# Patient Record
Sex: Female | Born: 1975 | Race: Black or African American | Hispanic: No | Marital: Single | Smoking: Never smoker
Health system: Southern US, Community
[De-identification: ages and names within clinical notes are randomized; demographics above are authoritative.]

## PROBLEM LIST (undated history)

## (undated) DIAGNOSIS — F419 Anxiety disorder, unspecified: Secondary | ICD-10-CM

## (undated) DIAGNOSIS — IMO0002 Reserved for concepts with insufficient information to code with codable children: Secondary | ICD-10-CM

## (undated) DIAGNOSIS — K449 Diaphragmatic hernia without obstruction or gangrene: Secondary | ICD-10-CM

## (undated) DIAGNOSIS — F329 Major depressive disorder, single episode, unspecified: Secondary | ICD-10-CM

## (undated) DIAGNOSIS — M5126 Other intervertebral disc displacement, lumbar region: Secondary | ICD-10-CM

## (undated) DIAGNOSIS — K589 Irritable bowel syndrome without diarrhea: Secondary | ICD-10-CM

## (undated) DIAGNOSIS — G43909 Migraine, unspecified, not intractable, without status migrainosus: Secondary | ICD-10-CM

## (undated) DIAGNOSIS — N879 Dysplasia of cervix uteri, unspecified: Secondary | ICD-10-CM

## (undated) DIAGNOSIS — R0600 Dyspnea, unspecified: Secondary | ICD-10-CM

## (undated) DIAGNOSIS — F4310 Post-traumatic stress disorder, unspecified: Secondary | ICD-10-CM

## (undated) DIAGNOSIS — K219 Gastro-esophageal reflux disease without esophagitis: Secondary | ICD-10-CM

## (undated) DIAGNOSIS — E669 Obesity, unspecified: Secondary | ICD-10-CM

## (undated) DIAGNOSIS — F32A Depression, unspecified: Secondary | ICD-10-CM

## (undated) DIAGNOSIS — M8580 Other specified disorders of bone density and structure, unspecified site: Secondary | ICD-10-CM

## (undated) DIAGNOSIS — M549 Dorsalgia, unspecified: Secondary | ICD-10-CM

## (undated) DIAGNOSIS — J329 Chronic sinusitis, unspecified: Secondary | ICD-10-CM

## (undated) DIAGNOSIS — R351 Nocturia: Secondary | ICD-10-CM

## (undated) DIAGNOSIS — IMO0001 Reserved for inherently not codable concepts without codable children: Secondary | ICD-10-CM

## (undated) HISTORY — PX: COLPOSCOPY: SHX161

## (undated) HISTORY — DX: Anxiety disorder, unspecified: F41.9

## (undated) HISTORY — DX: Other specified disorders of bone density and structure, unspecified site: M85.80

## (undated) HISTORY — DX: Dysplasia of cervix uteri, unspecified: N87.9

## (undated) HISTORY — DX: Depression, unspecified: F32.A

## (undated) HISTORY — DX: Gastro-esophageal reflux disease without esophagitis: K21.9

## (undated) HISTORY — DX: Obesity, unspecified: E66.9

## (undated) HISTORY — DX: Diaphragmatic hernia without obstruction or gangrene: K44.9

## (undated) HISTORY — DX: Major depressive disorder, single episode, unspecified: F32.9

## (undated) HISTORY — DX: Reserved for concepts with insufficient information to code with codable children: IMO0002

## (undated) HISTORY — DX: Irritable bowel syndrome, unspecified: K58.9

## (undated) HISTORY — DX: Migraine, unspecified, not intractable, without status migrainosus: G43.909

## (undated) HISTORY — DX: Chronic sinusitis, unspecified: J32.9

## (undated) HISTORY — PX: CERVICAL BIOPSY  W/ LOOP ELECTRODE EXCISION: SUR135

---

## 1998-06-24 ENCOUNTER — Other Ambulatory Visit: Admission: RE | Admit: 1998-06-24 | Discharge: 1998-06-24 | Payer: Self-pay | Admitting: Gynecology

## 1998-08-05 ENCOUNTER — Other Ambulatory Visit: Admission: RE | Admit: 1998-08-05 | Discharge: 1998-08-05 | Payer: Self-pay | Admitting: Gynecology

## 1998-10-09 HISTORY — PX: LEEP: SHX91

## 1998-12-14 ENCOUNTER — Other Ambulatory Visit: Admission: RE | Admit: 1998-12-14 | Discharge: 1998-12-14 | Payer: Self-pay | Admitting: Gynecology

## 1999-03-28 ENCOUNTER — Other Ambulatory Visit: Admission: RE | Admit: 1999-03-28 | Discharge: 1999-03-28 | Payer: Self-pay | Admitting: Obstetrics and Gynecology

## 1999-05-05 ENCOUNTER — Encounter (INDEPENDENT_AMBULATORY_CARE_PROVIDER_SITE_OTHER): Payer: Self-pay

## 1999-05-05 ENCOUNTER — Other Ambulatory Visit: Admission: RE | Admit: 1999-05-05 | Discharge: 1999-05-05 | Payer: Self-pay | Admitting: *Deleted

## 1999-05-25 ENCOUNTER — Ambulatory Visit (HOSPITAL_COMMUNITY): Admission: RE | Admit: 1999-05-25 | Discharge: 1999-05-25 | Payer: Self-pay | Admitting: *Deleted

## 1999-05-25 ENCOUNTER — Encounter (INDEPENDENT_AMBULATORY_CARE_PROVIDER_SITE_OTHER): Payer: Self-pay

## 2000-01-30 ENCOUNTER — Encounter (INDEPENDENT_AMBULATORY_CARE_PROVIDER_SITE_OTHER): Payer: Self-pay | Admitting: Specialist

## 2000-01-30 ENCOUNTER — Other Ambulatory Visit: Admission: RE | Admit: 2000-01-30 | Discharge: 2000-01-30 | Payer: Self-pay | Admitting: *Deleted

## 2000-08-28 ENCOUNTER — Other Ambulatory Visit: Admission: RE | Admit: 2000-08-28 | Discharge: 2000-08-28 | Payer: Self-pay | Admitting: Obstetrics and Gynecology

## 2001-02-04 ENCOUNTER — Other Ambulatory Visit: Admission: RE | Admit: 2001-02-04 | Discharge: 2001-02-04 | Payer: Self-pay | Admitting: *Deleted

## 2001-02-04 ENCOUNTER — Encounter (INDEPENDENT_AMBULATORY_CARE_PROVIDER_SITE_OTHER): Payer: Self-pay

## 2001-10-09 HISTORY — PX: WISDOM TOOTH EXTRACTION: SHX21

## 2001-10-25 ENCOUNTER — Other Ambulatory Visit: Admission: RE | Admit: 2001-10-25 | Discharge: 2001-10-25 | Payer: Self-pay | Admitting: *Deleted

## 2001-12-18 ENCOUNTER — Emergency Department (HOSPITAL_COMMUNITY): Admission: EM | Admit: 2001-12-18 | Discharge: 2001-12-18 | Payer: Self-pay | Admitting: Emergency Medicine

## 2002-01-20 ENCOUNTER — Other Ambulatory Visit: Admission: RE | Admit: 2002-01-20 | Discharge: 2002-01-20 | Payer: Self-pay | Admitting: *Deleted

## 2002-04-16 ENCOUNTER — Ambulatory Visit (HOSPITAL_COMMUNITY): Admission: RE | Admit: 2002-04-16 | Discharge: 2002-04-16 | Payer: Self-pay | Admitting: Internal Medicine

## 2002-07-10 ENCOUNTER — Other Ambulatory Visit: Admission: RE | Admit: 2002-07-10 | Discharge: 2002-07-10 | Payer: Self-pay | Admitting: *Deleted

## 2002-10-09 HISTORY — PX: CHOLECYSTECTOMY: SHX55

## 2002-10-17 ENCOUNTER — Other Ambulatory Visit: Admission: RE | Admit: 2002-10-17 | Discharge: 2002-10-17 | Payer: Self-pay | Admitting: *Deleted

## 2002-10-21 ENCOUNTER — Observation Stay (HOSPITAL_COMMUNITY): Admission: RE | Admit: 2002-10-21 | Discharge: 2002-10-22 | Payer: Self-pay | Admitting: General Surgery

## 2003-04-20 ENCOUNTER — Other Ambulatory Visit: Admission: RE | Admit: 2003-04-20 | Discharge: 2003-04-20 | Payer: Self-pay | Admitting: *Deleted

## 2003-10-10 HISTORY — PX: SEPTOPLASTY: SHX2393

## 2003-10-10 HISTORY — PX: NASAL TURBINATE REDUCTION: SHX2072

## 2003-10-10 HISTORY — PX: TONSILLECTOMY: SHX5217

## 2003-11-03 ENCOUNTER — Other Ambulatory Visit: Admission: RE | Admit: 2003-11-03 | Discharge: 2003-11-03 | Payer: Self-pay | Admitting: Obstetrics and Gynecology

## 2003-12-14 ENCOUNTER — Emergency Department (HOSPITAL_COMMUNITY): Admission: AD | Admit: 2003-12-14 | Discharge: 2003-12-14 | Payer: Self-pay | Admitting: Family Medicine

## 2003-12-29 ENCOUNTER — Ambulatory Visit (HOSPITAL_BASED_OUTPATIENT_CLINIC_OR_DEPARTMENT_OTHER): Admission: RE | Admit: 2003-12-29 | Discharge: 2003-12-29 | Payer: Self-pay | Admitting: Otolaryngology

## 2004-03-07 ENCOUNTER — Emergency Department (HOSPITAL_COMMUNITY): Admission: EM | Admit: 2004-03-07 | Discharge: 2004-03-07 | Payer: Self-pay | Admitting: Family Medicine

## 2004-04-26 ENCOUNTER — Other Ambulatory Visit: Admission: RE | Admit: 2004-04-26 | Discharge: 2004-04-26 | Payer: Self-pay | Admitting: Obstetrics and Gynecology

## 2004-05-16 ENCOUNTER — Encounter (INDEPENDENT_AMBULATORY_CARE_PROVIDER_SITE_OTHER): Payer: Self-pay | Admitting: *Deleted

## 2004-05-16 ENCOUNTER — Ambulatory Visit (HOSPITAL_COMMUNITY): Admission: RE | Admit: 2004-05-16 | Discharge: 2004-05-16 | Payer: Self-pay | Admitting: Otolaryngology

## 2004-05-16 ENCOUNTER — Ambulatory Visit (HOSPITAL_BASED_OUTPATIENT_CLINIC_OR_DEPARTMENT_OTHER): Admission: RE | Admit: 2004-05-16 | Discharge: 2004-05-16 | Payer: Self-pay | Admitting: Otolaryngology

## 2004-09-15 ENCOUNTER — Other Ambulatory Visit: Admission: RE | Admit: 2004-09-15 | Discharge: 2004-09-15 | Payer: Self-pay | Admitting: Obstetrics and Gynecology

## 2004-11-09 ENCOUNTER — Ambulatory Visit (HOSPITAL_COMMUNITY): Admission: RE | Admit: 2004-11-09 | Discharge: 2004-11-09 | Payer: Self-pay | Admitting: Family Medicine

## 2004-12-15 ENCOUNTER — Encounter: Admission: RE | Admit: 2004-12-15 | Discharge: 2004-12-15 | Payer: Self-pay | Admitting: Neurosurgery

## 2004-12-29 ENCOUNTER — Other Ambulatory Visit: Admission: RE | Admit: 2004-12-29 | Discharge: 2004-12-29 | Payer: Self-pay | Admitting: Obstetrics and Gynecology

## 2005-01-05 ENCOUNTER — Encounter: Admission: RE | Admit: 2005-01-05 | Discharge: 2005-01-05 | Payer: Self-pay | Admitting: Neurosurgery

## 2005-04-03 ENCOUNTER — Ambulatory Visit: Payer: Self-pay | Admitting: Gastroenterology

## 2005-06-13 ENCOUNTER — Encounter: Admission: RE | Admit: 2005-06-13 | Discharge: 2005-06-13 | Payer: Self-pay | Admitting: Neurosurgery

## 2005-09-13 ENCOUNTER — Other Ambulatory Visit: Admission: RE | Admit: 2005-09-13 | Discharge: 2005-09-13 | Payer: Self-pay | Admitting: Obstetrics and Gynecology

## 2005-11-08 ENCOUNTER — Ambulatory Visit: Payer: Self-pay | Admitting: Gastroenterology

## 2005-12-27 ENCOUNTER — Ambulatory Visit: Payer: Self-pay | Admitting: Gastroenterology

## 2006-01-01 ENCOUNTER — Other Ambulatory Visit: Admission: RE | Admit: 2006-01-01 | Discharge: 2006-01-01 | Payer: Self-pay | Admitting: Obstetrics and Gynecology

## 2006-04-08 HISTORY — PX: ESOPHAGOGASTRODUODENOSCOPY: SHX1529

## 2006-04-08 HISTORY — PX: SIGMOIDOSCOPY: SUR1295

## 2006-04-23 ENCOUNTER — Ambulatory Visit: Payer: Self-pay | Admitting: Gastroenterology

## 2006-05-01 ENCOUNTER — Encounter (INDEPENDENT_AMBULATORY_CARE_PROVIDER_SITE_OTHER): Payer: Self-pay | Admitting: Gastroenterology

## 2006-05-01 ENCOUNTER — Ambulatory Visit: Payer: Self-pay | Admitting: Gastroenterology

## 2006-06-29 ENCOUNTER — Other Ambulatory Visit: Admission: RE | Admit: 2006-06-29 | Discharge: 2006-06-29 | Payer: Self-pay | Admitting: Obstetrics and Gynecology

## 2006-07-11 ENCOUNTER — Ambulatory Visit: Payer: Self-pay | Admitting: Gastroenterology

## 2006-07-13 ENCOUNTER — Ambulatory Visit (HOSPITAL_COMMUNITY): Admission: RE | Admit: 2006-07-13 | Discharge: 2006-07-13 | Payer: Self-pay | Admitting: Gastroenterology

## 2006-08-15 ENCOUNTER — Ambulatory Visit (HOSPITAL_COMMUNITY): Admission: RE | Admit: 2006-08-15 | Discharge: 2006-08-15 | Payer: Self-pay | Admitting: Obstetrics and Gynecology

## 2006-12-06 ENCOUNTER — Encounter: Admission: RE | Admit: 2006-12-06 | Discharge: 2006-12-06 | Payer: Self-pay | Admitting: Family Medicine

## 2007-01-02 ENCOUNTER — Ambulatory Visit: Payer: Self-pay | Admitting: Gastroenterology

## 2007-01-09 ENCOUNTER — Ambulatory Visit: Payer: Self-pay | Admitting: Gastroenterology

## 2007-01-10 ENCOUNTER — Other Ambulatory Visit: Admission: RE | Admit: 2007-01-10 | Discharge: 2007-01-10 | Payer: Self-pay | Admitting: Obstetrics and Gynecology

## 2007-02-26 ENCOUNTER — Encounter: Admission: RE | Admit: 2007-02-26 | Discharge: 2007-02-26 | Payer: Self-pay | Admitting: Surgery

## 2007-05-04 ENCOUNTER — Encounter: Admission: RE | Admit: 2007-05-04 | Discharge: 2007-05-04 | Payer: Self-pay | Admitting: Neurosurgery

## 2007-07-04 ENCOUNTER — Other Ambulatory Visit: Admission: RE | Admit: 2007-07-04 | Discharge: 2007-07-04 | Payer: Self-pay | Admitting: Obstetrics and Gynecology

## 2007-08-23 ENCOUNTER — Ambulatory Visit: Payer: Self-pay | Admitting: Internal Medicine

## 2007-11-21 DIAGNOSIS — K6289 Other specified diseases of anus and rectum: Secondary | ICD-10-CM

## 2007-11-21 DIAGNOSIS — F341 Dysthymic disorder: Secondary | ICD-10-CM

## 2007-11-21 DIAGNOSIS — K449 Diaphragmatic hernia without obstruction or gangrene: Secondary | ICD-10-CM | POA: Insufficient documentation

## 2007-11-21 DIAGNOSIS — K589 Irritable bowel syndrome without diarrhea: Secondary | ICD-10-CM | POA: Insufficient documentation

## 2007-11-21 DIAGNOSIS — E669 Obesity, unspecified: Secondary | ICD-10-CM

## 2007-12-30 ENCOUNTER — Ambulatory Visit: Payer: Self-pay | Admitting: Internal Medicine

## 2008-01-13 ENCOUNTER — Other Ambulatory Visit: Admission: RE | Admit: 2008-01-13 | Discharge: 2008-01-13 | Payer: Self-pay | Admitting: Obstetrics and Gynecology

## 2008-03-03 ENCOUNTER — Telehealth: Payer: Self-pay | Admitting: Internal Medicine

## 2008-06-01 ENCOUNTER — Telehealth: Payer: Self-pay | Admitting: Internal Medicine

## 2008-06-01 ENCOUNTER — Telehealth (INDEPENDENT_AMBULATORY_CARE_PROVIDER_SITE_OTHER): Payer: Self-pay | Admitting: *Deleted

## 2008-06-01 ENCOUNTER — Ambulatory Visit: Payer: Self-pay | Admitting: Internal Medicine

## 2008-06-01 DIAGNOSIS — R197 Diarrhea, unspecified: Secondary | ICD-10-CM

## 2008-08-10 ENCOUNTER — Ambulatory Visit: Payer: Self-pay | Admitting: Internal Medicine

## 2008-08-10 DIAGNOSIS — K625 Hemorrhage of anus and rectum: Secondary | ICD-10-CM

## 2008-09-02 ENCOUNTER — Other Ambulatory Visit: Admission: RE | Admit: 2008-09-02 | Discharge: 2008-09-02 | Payer: Self-pay | Admitting: Obstetrics and Gynecology

## 2008-09-02 ENCOUNTER — Encounter: Payer: Self-pay | Admitting: Obstetrics and Gynecology

## 2008-09-02 ENCOUNTER — Ambulatory Visit: Payer: Self-pay | Admitting: Obstetrics and Gynecology

## 2008-09-07 ENCOUNTER — Encounter: Payer: Self-pay | Admitting: Internal Medicine

## 2008-09-07 ENCOUNTER — Ambulatory Visit: Payer: Self-pay | Admitting: Internal Medicine

## 2008-09-07 HISTORY — PX: COLONOSCOPY: SHX174

## 2008-10-13 ENCOUNTER — Ambulatory Visit: Payer: Self-pay | Admitting: Internal Medicine

## 2008-10-13 DIAGNOSIS — K649 Unspecified hemorrhoids: Secondary | ICD-10-CM | POA: Insufficient documentation

## 2009-01-05 ENCOUNTER — Telehealth: Payer: Self-pay | Admitting: Internal Medicine

## 2009-02-02 ENCOUNTER — Other Ambulatory Visit: Admission: RE | Admit: 2009-02-02 | Discharge: 2009-02-02 | Payer: Self-pay | Admitting: Obstetrics and Gynecology

## 2009-02-02 ENCOUNTER — Encounter: Payer: Self-pay | Admitting: Obstetrics and Gynecology

## 2009-02-02 ENCOUNTER — Ambulatory Visit: Payer: Self-pay | Admitting: Obstetrics and Gynecology

## 2009-03-18 ENCOUNTER — Encounter: Payer: Self-pay | Admitting: Physician Assistant

## 2009-03-18 ENCOUNTER — Ambulatory Visit: Payer: Self-pay | Admitting: Internal Medicine

## 2009-03-18 ENCOUNTER — Telehealth: Payer: Self-pay | Admitting: Internal Medicine

## 2009-03-23 ENCOUNTER — Telehealth: Payer: Self-pay | Admitting: Internal Medicine

## 2009-04-13 LAB — CONVERTED CEMR LAB: Tissue Transglutaminase Ab, IgA: 0.3 units (ref <7)

## 2010-02-17 ENCOUNTER — Ambulatory Visit: Payer: Self-pay | Admitting: Obstetrics and Gynecology

## 2010-02-17 ENCOUNTER — Other Ambulatory Visit: Admission: RE | Admit: 2010-02-17 | Discharge: 2010-02-17 | Payer: Self-pay | Admitting: Obstetrics and Gynecology

## 2010-03-17 ENCOUNTER — Ambulatory Visit (HOSPITAL_COMMUNITY): Admission: RE | Admit: 2010-03-17 | Discharge: 2010-03-17 | Payer: Self-pay | Admitting: Obstetrics and Gynecology

## 2010-04-18 ENCOUNTER — Telehealth: Payer: Self-pay | Admitting: Internal Medicine

## 2010-10-30 ENCOUNTER — Encounter: Payer: Self-pay | Admitting: Surgery

## 2010-10-30 ENCOUNTER — Encounter: Payer: Self-pay | Admitting: Neurosurgery

## 2010-11-08 NOTE — Progress Notes (Signed)
Summary: Carafate samples  Phone Note Call from Patient Call back at Home Phone [phone]   Caller: Patient Call For: Dr. Leone Payor Reason for Call: Talk to Nurse Summary of Call: wants to know if we have any samples of Carafate Initial call taken by: Karna Christmas,  April 18, 2010 12:25 PM  Follow-up for Phone Call        Advised pt we do not have samples.  Pt requests refill.  Pt states she is doing well.  Her medicines are working and she is not having any problems.  Request refill to be sent to [facility]. Follow-up by: Francee Piccolo CMA Duncan Dull),  April 18, 2010 2:12 PM    New/Updated Medications: CARAFATE 1 GM TABS (SUCRALFATE) Take 1 tablet by mouth up to four times a day as needed Prescriptions: CARAFATE 1 GM TABS (SUCRALFATE) Take 1 tablet by mouth up to four times a day as needed  #120 x 5   Entered by:   Francee Piccolo CMA (AAMA)   Authorized by:   Iva Boop MD, [facility]   Signed by:   Francee Piccolo CMA (AAMA) on 04/18/2010   Method used:   Electronically to        [facility]  [phone]* (retail)       [address]       [place], [place]  [address]       Ph: [phone] or [phone]       Fax: [phone]   RxID:   [id number]

## 2010-11-18 ENCOUNTER — Other Ambulatory Visit: Payer: Self-pay | Admitting: Anesthesiology

## 2010-11-18 DIAGNOSIS — M545 Low back pain: Secondary | ICD-10-CM

## 2010-11-21 ENCOUNTER — Other Ambulatory Visit: Payer: Self-pay | Admitting: Family Medicine

## 2010-11-21 DIAGNOSIS — N649 Disorder of breast, unspecified: Secondary | ICD-10-CM

## 2010-11-26 ENCOUNTER — Ambulatory Visit
Admission: RE | Admit: 2010-11-26 | Discharge: 2010-11-26 | Disposition: A | Discharge status: Home / Self Care | Payer: Medicare HMO | Source: Ambulatory Visit | Attending: Anesthesiology | Admitting: Anesthesiology

## 2010-11-26 DIAGNOSIS — M545 Low back pain: Secondary | ICD-10-CM

## 2010-11-28 ENCOUNTER — Inpatient Hospital Stay: Admission: RE | Admit: 2010-11-28 | Payer: Self-pay | Source: Ambulatory Visit

## 2010-12-02 ENCOUNTER — Ambulatory Visit
Admission: RE | Admit: 2010-12-02 | Discharge: 2010-12-02 | Disposition: A | Discharge status: Home / Self Care | Payer: Private Health Insurance - Indemnity | Source: Ambulatory Visit | Attending: Family Medicine | Admitting: Family Medicine

## 2010-12-02 ENCOUNTER — Other Ambulatory Visit: Payer: Self-pay | Admitting: Family Medicine

## 2010-12-02 DIAGNOSIS — N649 Disorder of breast, unspecified: Secondary | ICD-10-CM

## 2011-01-30 ENCOUNTER — Other Ambulatory Visit: Payer: Self-pay | Admitting: Internal Medicine

## 2011-02-21 NOTE — Assessment & Plan Note (Signed)
[facility]                         GASTROENTEROLOGY OFFICE NOTE   Janet Blanchard, Janet Blanchard                       MRN:          [id number]  DATE:12/30/2007                            DOB:          [birth year]    CHIEF COMPLAINT:  Diarrhea.   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  Biopsies have not really confirmed this, though  she had previously been treated with Canasa and Asacol.  I last saw her  in November 2008, when I assumed her regular GI care from Dr. Victorino Dike.  Since that time, she has had a reduction in the dose of her  Asacol from 8 tablets a day to 4 a day.  For the past 2 weeks she has  had diarrhea, though she has had some formed bowel movements as well it  turns out when talking to her further.  A new problem has been low back  pain related to herniated disk in the L4, L5 and S1 region.  She is on  Lortab daily coordinated through Dr. Vear Clock' pain clinic.  She has  seen Dr. Channing Mutters of neurosurgery.  She continues to have a burning  epigastric pain and a lot of nausea and anorexia which is disturbing  her.  This has been going on for a couple of  weeks.  No fever.  No  other infectious symptoms.  No sick contacts.  No recent antibiotics or  hospital admission.  The epigastric pain is something she has had in the  past but she says she just feels kind of blah all over.   She brings with her a printed medication list which is reviewed.  She is  on:  1. Asacol 400 mg two tablets b.i.d.  2. Carafate 1 gram daily.  3. Lexapro 10 mg at bedtime.  4. Loestrin Fe daily.  5. AZO yeast over-the-counter.  6. Calcium 1200 mg daily.  7. Multivitamin daily.  8. Vitamin C daily.  9. Fish oil 2000 mg daily.  10.B complex 150, two tabs over-the-counter.  11.Magnesium 200 mg OTC.  12.Metanx.  13.Maxalt p.r.n.  14.Xanax p.r.n.  15.Lortab p.r.n.  16.Neurontin.  17.Flexeril.  18.She also  uses Aleve.  19.Zantac.  20.__________  .  21.Pepto-Bismol.  22.Benadryl.  23.BC powder.  24.Imodium over-the-counter.   PAST MEDICAL HISTORY:  See list of August 23, 2007, but problems are:  1. Irritable bowel syndrome.  2. Question of inflammatory bowel disease.  3. Morbid obesity.  4. Anxiety and depression.  5. Gastroesophageal reflux disease.  6. Migraine headaches.  7. Chronic  allergic rhinosinusitis.  8. Osteopenia.   PAST SURGICAL HISTORY:  1. LEEP, 2000.  2. Laparoscopic cholecystectomy, January 2004.      a.     There has been some question as to whether or not she has       had post cholecystectomy diarrhea in the past as well.  3. Tonsillectomy.  4. Turbinate reduction.  5. Septoplasty by Dr. Annalee Genta.   She was on Vicodin which did not work so well, so  she was changed to  Lortab.  The Vicodin constipated her quite a bit which is the real  reason it was changed.  However, it was 2 weeks after she was on the  Lortab that these diarrhea symptoms started but again she has had some  formed bowel movements.  No rectal bleeding.   PHYSICAL EXAMINATION:  GENERAL:  Reveals a morbidly obese black woman.  VITAL SIGNS:  Weight 335 pounds, pulse 80, blood pressure 128/88.  EYES:  Anicteric.  ABDOMEN:  Obese, mildly tender in the epigastrium without organomegaly  or mass.   ASSESSMENT:  Diarrhea type symptoms but I think she really has irritable  bowel problems.  She has never had conclusive proof of inflammatory  bowel disease and we talked about this today.  She is also having some  epigastric pain.  I think this may be a function of functional bowel  disturbance moreover as well.   PLAN:  1. Hyoscyamine 0.375 mg b.i.d.  2. Increase Carafate to 4 times a day.  3. Stop the Asacol for the time being.  4. I doubt the Lortab is causing these symptoms.  5. Give this treatment plan 2 weeks or more to see if it will work and      follow up as needed.  6. We could add  a proton pump inhibitor perhaps.  This is for the      epigastric discomfort.  7. She may need an endoscopy or even a repeat sigmoidoscopy or      colonoscopy depending upon the overall symptom complex.  She had an      EGD in July 2007, which showed a small hiatal hernia.  She had a      flex sig with Dr. Corinda Gubler and a previous colonoscopy with Dr.      Jena Gauss.   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     Iva Boop, MD,FACG  Electronically Signed    CEG/MedQ  DD: 12/30/2007  DT: 12/30/2007  Job #: [id number]   cc:   Corrie Mckusick, M.D.

## 2011-02-21 NOTE — Assessment & Plan Note (Signed)
[facility]                         GASTROENTEROLOGY OFFICE NOTE   Janet Blanchard, Janet Blanchard                       MRN:          [id number]  DATE:08/23/2007                            DOB:          03-27-1976    CHIEF COMPLAINT:  Needs refill of Carafate.   PROBLEMS:  1. Hiatal hernia, symptomatic.  2. Obesity, morbid.  3. Mucosal changes in sigmoid and rectum that look like colitis and      proctitis, biopsies 2007 (Dr. Corinda Gubler), 2003 (Dr. Jena Gauss) not      revealing.  She does respond to mesalamine compound in the form of      suppositories with Canasa and Asacol.  4. Lexapro therapy, anxiety/depression.  5. Irritable bowel syndrome diagnosis as well.  6. Prior cholecystectomy.  7. ALLERGY TO LEVAQUIN (RASH).  8. ALLERGY TO SULFA (RASH).  9. Question of post cholecystectomy diarrhea in the past.   MEDICATIONS:  1. Lexapro 10 mg daily.  2. Carafate 1 gram daily, up to 4 a day, usually 1 a day.  3. Asacol 400 mg two twice daily.  4. Multivitamin daily.  5. Calcium supplement.  6. Loestrin Fe 24 daily.  7. Vitamin E daily.  8. Imodium p.r.n., Pepto-Bismol p.r.n., Gas-X p.r.n., Xanax p.r.n.  9. Benadryl Allergy Sinus p.r.n.  10.Aleve p.r.n.   INTERVAL HISTORY:  She is doing reasonably well with rare rectal  bleeding once a month or so.  Her stools seem to be okay on the current  regimen.  She is using the Asacol but she has Canasa suppositories at  home, not using at this time.  She says she takes Carafate once a day;  if she does not do that, she has miserable epigastric and lower sternal  pain at times after eating.   SOCIAL HISTORY:  1. She does not smoke.  2. She works as a Engineer, civil (consulting) at [employer].   PHYSICAL EXAM:  Height 5 feet 2 inches, weight 331 pounds, this gives  her a body mass index of 60.5, pulse 72, blood pressure 100/72.   ASSESSMENT:  1. Some sort of inflammatory bowel disease, it seems, on two different      endoscopic  procedures.  There has been inflammation in the      rectosigmoid.  Biopsies have not confirmed this, though she      responds to ASA compound.  2. Symptomatic hiatal hernia plus/minus gastroesophageal reflux      disease.  3. Morbid obesity.   PLANS:  1. Continue current therapy.  She can use Canasa suppositories for the      rare rectal bleeding.  2. Return to see me in 6 to 12 months.  3. Refill her medications.  4. We had a discussion about her obesity and health risks.  She is      having low back pain.  She understands the risks, it seems.  She      had been to the surgical seminars but thinks that she could not      avoid fruits or sweets that would be required with the surgery.  She has talked to a nutritionist who recommended a 2500 calorie      diet.  That does not sound like enough caloric restriction, in my      opinion.  We talked about that as well.  I emphasized the extreme      health risks of her weight.  Further discussions later.     Iva Boop, MD,FACG  Electronically Signed    CEG/MedQ  DD: 08/24/2007  DT: 08/25/2007  Job #: [id number]   cc:   Corrie Mckusick, M.D.

## 2011-02-24 NOTE — H&P (Signed)
   NAME:  Janet Blanchard, Janet Blanchard                            ACCOUNT NO.:  [id number]   MEDICAL RECORD NO.:  [id number]                  PATIENT TYPE:   LOCATION:                                       FACILITY:  [facility]   PHYSICIAN:  Jerolyn Shin C. [name], M.D.                DATE OF BIRTH:   DATE OF ADMISSION:  DATE OF DISCHARGE:                                HISTORY & PHYSICAL   HISTORY OF PRESENT ILLNESS:  [age]-year-old female with frequent  indigestion, gas and bloating.  Symptoms are much worse in the postprandial  period.  Her last attack was one week prior to evaluation, in early October.  Symptoms have become more frequent.  An attack usually last about an hour or  so.  She has a history of a positive HIDA scan back in 1999.  Because of  progressive symptoms the patient is scheduled for laparoscopic  cholecystectomy.   PAST HISTORY:  The patient has depression and seasonal allergies.  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   PAST SURGICAL HISTORY:  LEEP procedure.   MEDICATIONS:  1. Depo-Provera every three months.  2. Nasonex.  3. Allergy injections.   ALLERGIES:  LEVAQUIN and SULFA.   FAMILY HISTORY:  Positive for hypertension, diabetes mellitus, stroke and  depression.   PHYSICAL EXAMINATION:  VITAL SIGNS:  Blood pressure 130/72, pulse 90,  respirations 20 and weight 286 pounds.  HEENT:  Unremarkable.  NECK:  Supple without JVD or bruits.  CHEST: Clear to auscultation.  HEART:  Regular rate and rhythm without murmur, gallop or rub.  ABDOMEN:  Soft and nontender.  Obese.  No masses.  EXTREMITIES:  No cyanosis, clubbing or edema.  NEUROLOGIC EXAMINATION:  No focal motor, sensory or cerebellar deficit.   IMPRESSION:  1. Chronic cholecystitis with biliary colic.  2.     Depression.  3. Multiple allergies.   PLAN:  Laparoscopic cholecystectomy.                                               Dirk Dress. [name], M.D.    LCS/MEDQ  D:   10/20/2002  T:  10/21/2002  Job:  [id number]

## 2011-02-24 NOTE — Letter (Signed)
January 02, 2007    Thornton Park. Daphine Deutscher, MD  [address], [address]  [place], [place] [address]   RE:  MYRANDA, PAVONE  MRN:  [id number]  /  DOB:  Aug 05, 1976   Dear Susy Frizzle:   I would like you to see in consultation this very nice, above-named  patient whom I have seen for several years with problems related to her  GERD.  She has controlled this, basically, with Protonix.  We have  talked about fundoplication, and possibly you could discuss this with  her, although I do not think she is really anxious to have this surgery.  She presented today in my office with pain in the right inguinal area  which we both thought was probably an early femoral hernia, and she  wanted to discuss this further with you as to how she should deal with  this in the future.  I also had suggested that she talk to you about  possible surgery for her obesity problem.  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    Sincerely,      Ulyess Mort, MD  Electronically Signed    SML/MedQ  DD: 01/02/2007  DT: 01/02/2007  Job #: [id number]

## 2011-02-24 NOTE — Op Note (Signed)
NAME:  Janet Blanchard, Janet Blanchard                          ACCOUNT NO.:  [id number]   MEDICAL RECORD NO.:  [id number]                   PATIENT TYPE:  AMB   LOCATION:  [facility]                                  FACILITY:  [facility]   PHYSICIAN:  David L. Annalee Genta, M.D.            DATE OF BIRTH:  1976/04/01   DATE OF PROCEDURE:  05/16/2004  DATE OF DISCHARGE:                                 OPERATIVE REPORT   PREOPERATIVE DIAGNOSES:  1. Deviated nasal septum with nasal obstruction.  2. Chronic cryptic tonsillitis.  3. Bilateral inferior turbinate hypertrophy.  4. Chronic tonsillar hypertrophy.   POSTOPERATIVE DIAGNOSES:  1. Deviated nasal septum with nasal obstruction.  2. Chronic cryptic tonsillitis.  3. Bilateral inferior turbinate hypertrophy.  4. Chronic tonsillar hypertrophy.   SURGICAL PROCEDURE:  1. Nasal septoplasty.  2. Tonsillectomy.  3. Bilateral inferior turbinate intramural cautery.   ANESTHESIA:  General endotracheal.   SURGEON:  Kinnie Scales. Annalee Genta, M.D.   COMPLICATIONS:  None.   ESTIMATED BLOOD LOSS:  Approximately 50 mL.   DISPOSITION:  The patient was transferred from the operating room to the  recovery room in stable condition.   INDICATIONS FOR PROCEDURE:  The patient is a 35 year old black female who  was referred for evaluation of chronic nasal airway obstruction.  Evaluation  in the office revealed a severely-deviated septum and turbinate hypertrophy.  The patient also had a history of chronic cryptic tonsillitis with chronic  tonsillar discharge and recurrent infections.  Given the patient's history  and examination, a sleep study was performed in order to rule out  obstructive sleep apnea.  The patient had an RDI of two events per hour,  without significant oxygen desaturation.  Given the patient's history and  findings, I recommended a septoplasty, turbinate reduction and tonsillectomy  under general anesthesia.  The risks, benefits and possible complications of  the procedures were discussed in detail with the patient, who understood and  concurred with our plan for surgery, which was scheduled as above.   DESCRIPTION OF PROCEDURE:  The patient was brought to the operating room on  May 16, 2004, and placed in the supine position on the operating room  table.  General endotracheal anesthesia was established without difficulty.  The patient was adequately anesthetized.  Her nose was injected with 5 mL of  1% lidocaine and 1:100,000 solution epinephrine, injected in the submucosal  fashion along the nasal septum and inferior turbinates.  The patient's nose  was then packed with Afrin-soaked cottonoid pledgets and held in place for  approximately 10 minutes to allow for vasoconstriction.  The surgical  procedure was begun with a right hemi-transfixion incision that was carried  through the mucosa and underlying submucosa, and a mucoperichondrial flap  was elevated from anterior to posterior on the patient's right-hand side.  The bony cartilaginous junction was crossed at the midline, and a  mucoperiosteal flap was elevated along the patient's left-hand  side.  Deviated bone and cartilage in the mid and the posterior aspects of the  nasal septum were then resected, with a large bony septal spur removed with  the 4 mm osteotome.  The anterior septal cartilage and dorsal cartilage were  not implemented and this was significantly deviated.  The mid-septal  cartilage was morcellized and returned to the mucoperichondrial pocket at  the conclusion of the surgical procedure.  The patient's hemi-transfixion  incision and mucoperichondrial flaps were reapproximated with a #4-0 gut  suture on a Keith needle in a horizontal mattress fashion.  Bilateral Doyle  nasal septal splints were placed after the application of Bactroban Ointment  and sutured in place with a #3-0 Ethilon suture.  Bilateral inferior turbinate intramural cautery was then performed with the   cautery set at 12 watts.  Two submucosal passes were made in each inferior  turbinate.  When the turbinates were adequately cauterized, they were out-  fractured, and appeared a more patent nasal cavity.  Attention was then turned to the patient's oral cavity and oropharynx.  A  Crowe-Davis mouth gag was inserted without difficulty.  The patient had very  large cryptic tonsils.  Beginning on the patient's left-hand size using a  harmonic scalpel and dissecting in a subcapsular fashion, the entire left  tonsil was resected from superior pole to tongue base.  The right tonsil was  removed in a similar fashion.  A moderate amount of scar tissue was noted  within the peri-tonsillar capsule from previous infection.  There was no  active bleeding at the conclusion of the surgical procedure.  A tonsillar  sponge was then used to gently abrade the tonsillar fossa.  Several areas of  point hemorrhage were then cauterized.  An oral gastric tube was then passed  and the stomach contents were aspirated.  The patient's oral cavity,  oropharynx, nasal cavity and nasopharynx were then irrigated and suctioned.  The Crowe-Davis mouth gag was released and reapplied.  There was no active  bleeding.  The mouth gag was then removed.  There were no loose or broken  teeth.  The patient was awakened from anesthetic.  She was extubated and was then  transferred from the operating room to the recovery room in stable  condition.                                               Kinnie Scales. Annalee Genta, M.D.    DLS/MEDQ  D:  04/54/0981  T:  05/16/2004  Job:  [id number]

## 2011-03-02 ENCOUNTER — Encounter (INDEPENDENT_AMBULATORY_CARE_PROVIDER_SITE_OTHER): Payer: Private Health Insurance - Indemnity | Admitting: Obstetrics and Gynecology

## 2011-03-02 ENCOUNTER — Other Ambulatory Visit (HOSPITAL_COMMUNITY)
Admission: RE | Admit: 2011-03-02 | Discharge: 2011-03-02 | Disposition: A | Discharge status: Home / Self Care | Payer: Private Health Insurance - Indemnity | Source: Ambulatory Visit | Attending: Obstetrics and Gynecology | Admitting: Obstetrics and Gynecology

## 2011-03-02 ENCOUNTER — Other Ambulatory Visit: Payer: Self-pay | Admitting: Obstetrics and Gynecology

## 2011-03-02 DIAGNOSIS — Z01419 Encounter for gynecological examination (general) (routine) without abnormal findings: Secondary | ICD-10-CM

## 2011-03-02 DIAGNOSIS — Z124 Encounter for screening for malignant neoplasm of cervix: Secondary | ICD-10-CM | POA: Insufficient documentation

## 2011-03-02 DIAGNOSIS — R82998 Other abnormal findings in urine: Secondary | ICD-10-CM

## 2011-03-24 ENCOUNTER — Encounter: Payer: Self-pay | Admitting: Internal Medicine

## 2011-03-24 ENCOUNTER — Ambulatory Visit (INDEPENDENT_AMBULATORY_CARE_PROVIDER_SITE_OTHER): Payer: Private Health Insurance - Indemnity | Admitting: Internal Medicine

## 2011-03-24 DIAGNOSIS — E669 Obesity, unspecified: Secondary | ICD-10-CM

## 2011-03-24 DIAGNOSIS — K30 Functional dyspepsia: Secondary | ICD-10-CM

## 2011-03-24 DIAGNOSIS — R1013 Epigastric pain: Secondary | ICD-10-CM

## 2011-03-24 DIAGNOSIS — K589 Irritable bowel syndrome without diarrhea: Secondary | ICD-10-CM

## 2011-03-24 MED ORDER — SUCRALFATE 1 G PO TABS: 1.0000 g | ORAL_TABLET | Freq: Four times a day (QID) | ORAL | Status: DC | PRN

## 2011-03-24 MED ORDER — DICYCLOMINE HCL 20 MG PO TABS: 20.0000 mg | ORAL_TABLET | Freq: Two times a day (BID) | ORAL | Status: DC | PRN

## 2011-03-24 NOTE — Assessment & Plan Note (Signed)
She is currently using phentermine. She is down about 10 pounds since her visit in 2010. She has been to a bariatric surgery symptoms EMR session only once, I talked about this with her and told her that's the most successful way to lose weight and that there are some possible cardiac issues with phentermine. She is not interested in pursuing surgery, she believes she will not be a we'll control her eating habits and will eat through the procedure or develops some other type of addiction problem.

## 2011-03-24 NOTE — Assessment & Plan Note (Signed)
She is doing well on ranitidine and Carafate.

## 2011-03-24 NOTE — Patient Instructions (Signed)
Your prescriptions have been printed for you to take to your pharmacy. Return to see Dr. Leone Payor as needed.

## 2011-03-24 NOTE — Progress Notes (Signed)
  Subjective:    Patient ID: Josefina Do, female    DOB: 1975/11/05, 35 y.o.   MRN: [id number]  HPI 35 yo woman with IBS. Using intermittent dicyclomine and loperamide for diarrhea. Working well. Takes medications weekly on average. Carafate helps but takes bid with good results for heartburn, dyspepsia symptoms. 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  Review of Systems Chronic back pain on Norco. Trying to lose weight with phentermine.    Objective:   Physical Exam  Constitutional:       Morbidly obese nad  Cardiovascular: Normal rate, regular rhythm and normal heart sounds.   Pulmonary/Chest: Effort normal and breath sounds normal.  Abdominal: Soft. Bowel sounds are normal. She exhibits no mass. There is no tenderness.       obese  Psychiatric: She has a normal mood and affect.          Assessment & Plan:

## 2011-03-24 NOTE — Assessment & Plan Note (Addendum)
Symptoms are overall fairly well controlled using dicyclomine and occasional Imodium. Her chronic narcotics and Carafate may help with her diarrhea predominant IBS as well. Carafate is helping her dyspepsia symptoms which are a component of her functional GI disturbance as well. Will refill her medications and see as needed at this point.

## 2011-10-25 ENCOUNTER — Encounter (INDEPENDENT_AMBULATORY_CARE_PROVIDER_SITE_OTHER): Payer: Self-pay | Admitting: General Surgery

## 2011-10-25 ENCOUNTER — Ambulatory Visit (INDEPENDENT_AMBULATORY_CARE_PROVIDER_SITE_OTHER): Payer: Private Health Insurance - Indemnity | Admitting: General Surgery

## 2011-10-25 NOTE — Progress Notes (Signed)
Subjective:   Morbid obesity  Patient ID: Janet Blanchard, female   DOB: 11-22-1975, 36 y.o.   MRN: [id number]  HPI Janet Blanchard [id number] y.o.female presents for consideration for surgical treatment for morbid obesity.  she gives a history of progressive obesity since childhood despite multiple attempts at medical management.  her weight has been affecting her in a number of ways including exacerbation of chronic back pain secondary to a ruptured disc. She also has reflux exacerbated by her weight. .She is also very concerned about her long-term health going forward duties a strong family history of diabetes and hypertension although she has fortunately avoided these illnesses to date. She is very concerned about her long-term health.   she has been to our initial information seminar, researched surgical options thoroughly and is interested in lap band surgery.   Past Medical History  Diagnosis Date  . IBS (irritable bowel syndrome)   . Hemorrhoids   . Anxiety and depression   . Hiatal hernia   . Obesity   . Migraine headache   . Allergic rhinitis   . Sinusitis   . Osteopenia   . DDD (degenerative disc disease)     L4-S1 with chronic back pain   Past Surgical History  Procedure Date  . Cholecystectomy 1/04  . Leep 2000  . Tonsillectomy 2005  . Septoplasty 2005  . Nasal turbinate reduction 2005  . Wisdom tooth extraction 2003  . Colonoscopy 09/07/2008    small internal hemorrhoids, otherwise normal (biopsies) into terminal ileum  . Esophagogastroduodenoscopy 7/07    hiatal hernia  . Sigmoidoscopy 7/07    ? colitis/proctitis - biopsies normal   Current Outpatient Prescriptions  Medication Sig Dispense Refill  . ALPRAZolam (XANAX) 0.5 MG tablet Take 0.5 mg by mouth at bedtime as needed.        . Ascorbic Acid (VITAMIN C) 250 MG CHEW Chew 1 tablet by mouth daily.        . Aspirin Effervescent (ALKA-SELTZER EXTRA STRENGTH PO) Take by mouth. As needed       . caffeine 200 MG TABS Take 200  mg by mouth as needed.        . Calcium Carbonate (CALCIUM 600) 1500 MG TABS Take 2 tablets by mouth daily.        . Chlorphen-Phenyleph-ASA (ALKA-SELTZER PLUS COLD PO) Take by mouth. As directed as needed       . Cholecalciferol (VITAMIN D) 2000 UNITS CAPS Take 1 capsule by mouth 2 (two) times daily.        Marland Kitchen dicyclomine (BENTYL) 20 MG tablet Take 1 tablet (20 mg total) by mouth 2 (two) times daily as needed.  60 tablet  11  . Diphenhydramine-PE-APAP (BENADRYL ALLERGY/SINUS HEADACH) 12.5-5-325 MG TABS Take 1-2 tablets by mouth as needed.        Marland Kitchen HYDROcodone-acetaminophen (NORCO) 5-325 MG per tablet Take 1 tablet by mouth every 6 (six) hours as needed.        Marland Kitchen ibuprofen (ADVIL,MOTRIN) 200 MG tablet Take 200 mg by mouth as needed.        . MedroxyPROGESTERone Acetate (DEPO-PROVERA IM) Inject into the muscle every 3 (three) months.        . meloxicam (MOBIC) 15 MG tablet Take 15 mg by mouth daily.        Marland Kitchen perphenazine (TRILAFON) 4 MG tablet Take 4 mg by mouth 2 (two) times daily as needed.        . ranitidine (ZANTAC) 75 MG tablet Take  150 mg by mouth daily.       . sucralfate (CARAFATE) 1 G tablet Take 1 tablet (1 g total) by mouth 4 (four) times daily as needed.  120 tablet  11  . desvenlafaxine (PRISTIQ) 50 MG 24 hr tablet Take 50 mg by mouth daily.        . Melatonin 3 MG CAPS as needed.        . naproxen sodium (ANAPROX) 220 MG tablet Take 220 mg by mouth as needed.        . phentermine 37.5 MG capsule Take 37.5 mg by mouth every morning.         Allergies  Allergen Reactions  . Levofloxacin     REACTION: RASH  . Sulfasalazine     REACTION: RASH   History  Substance Use Topics  . Smoking status: Never Smoker   . Smokeless tobacco: Not on file  . Alcohol Use: Yes     3 glasses/week    Review of Systems  Constitutional: Positive for fatigue. Negative for fever and chills.  HENT: Negative.   Eyes: Negative.   Respiratory: Positive for shortness of breath. Negative for cough  and wheezing.   Cardiovascular: Negative for chest pain, palpitations and leg swelling.  Gastrointestinal: Positive for diarrhea. Negative for nausea, vomiting, abdominal pain and abdominal distention.  Genitourinary: Negative.   Musculoskeletal: Positive for myalgias, back pain and arthralgias.  Hematological: Negative.   Psychiatric/Behavioral: The patient is nervous/anxious.        Objective:   Physical Exam General: Alert, well-developed morbidly obese African American female, in no distress Skin: Warm and dry without rash or infection. HEENT: No palpable masses or thyromegaly. Sclera nonicteric. Pupils equal round and reactive. Oropharynx clear. Lymph nodes: No cervical, supraclavicular, or inguinal nodes palpable. Lungs: Breath sounds clear and equal without increased work of breathing Cardiovascular: Regular rate and rhythm without murmur. No JVD or edema. Peripheral pulses intact. Abdomen: Nondistended. Soft and nontender. No masses palpable. No organomegaly. No palpable hernias. Extremities: No edema or joint swelling or deformity. No chronic venous stasis changes. Neurologic: Alert and fully oriented. Gait normal.     Assessment:     Patient with progressive morbid obesity unresponsive to multiple efforts at medical management who presents with a BMI of 59.2 and comorbidities of arthritis and GERD. I believe there would be very significant medical benefit from surgical weight loss. After our discussion of surgical options currently available the patient has decided to proceed with LAP-BAND placement due to the reasons above. We have discussed the nature of morbid obesity and the risk of remaining obese. We discussed the indications for the procedure, its nature, and expected recovery. The risks of the procedure were discussed in detail including anesthetic complications, bleeding, infection, visceral injury, dysphagia, and long-term risks of mechanical failure, slippage, erosion,  esophageal dilatation and failure to lose weight. Rare risk of death was discussed. The patient was given a complete consent form and all questions were answered. We will proceed with preoperative workup including routine lab and x-rays, psychologic and nutrition evaluations, and upper GI series.  I will see the patient back following this evaluation. We will also plan a pre op BMI reduction diet.         Plan:     As above

## 2011-10-31 ENCOUNTER — Ambulatory Visit (HOSPITAL_COMMUNITY): Admission: RE | Admit: 2011-10-31 | Payer: Private Health Insurance - Indemnity | Source: Ambulatory Visit

## 2011-10-31 ENCOUNTER — Other Ambulatory Visit (HOSPITAL_COMMUNITY): Payer: Private Health Insurance - Indemnity

## 2011-11-01 LAB — CBC WITH DIFFERENTIAL/PLATELET
Basophils Absolute: 0 10*3/uL (ref 0.0–0.1)
Basophils Relative: 0 % (ref 0–1)
Eosinophils Absolute: 0.3 10*3/uL (ref 0.0–0.7)
Eosinophils Relative: 4 % (ref 0–5)
HCT: 42.4 % (ref 36.0–46.0)
Hemoglobin: 13.6 g/dL (ref 12.0–15.0)
MCH: 27.8 pg (ref 26.0–34.0)
MCHC: 32.1 g/dL (ref 30.0–36.0)
MCV: 86.7 fL (ref 78.0–100.0)
Monocytes Absolute: 0.5 10*3/uL (ref 0.1–1.0)
Monocytes Relative: 6 % (ref 3–12)
Neutro Abs: 3.3 10*3/uL (ref 1.7–7.7)
RDW: 13.6 % (ref 11.5–15.5)

## 2011-11-01 LAB — COMPREHENSIVE METABOLIC PANEL
AST: 14 U/L (ref 0–37)
Albumin: 4.3 g/dL (ref 3.5–5.2)
Alkaline Phosphatase: 77 U/L (ref 39–117)
BUN: 13 mg/dL (ref 6–23)
Creat: 0.79 mg/dL (ref 0.50–1.10)
Glucose, Bld: 76 mg/dL (ref 70–99)
Total Bilirubin: 0.2 mg/dL — ABNORMAL LOW (ref 0.3–1.2)

## 2011-11-01 LAB — LIPID PANEL
Cholesterol: 151 mg/dL (ref 0–200)
HDL: 42 mg/dL (ref >39)
Total CHOL/HDL Ratio: 3.6 Ratio

## 2011-11-01 LAB — HCG, SERUM, QUALITATIVE: Preg, Serum: NEGATIVE

## 2011-11-01 LAB — T4: T4, Total: 8.8 ug/dL (ref 5.0–12.5)

## 2011-11-02 ENCOUNTER — Ambulatory Visit (INDEPENDENT_AMBULATORY_CARE_PROVIDER_SITE_OTHER): Payer: Private Health Insurance - Indemnity | Admitting: Surgery

## 2011-11-03 ENCOUNTER — Ambulatory Visit (HOSPITAL_COMMUNITY): Payer: Private Health Insurance - Indemnity

## 2011-11-06 ENCOUNTER — Ambulatory Visit: Payer: Private Health Insurance - Indemnity | Admitting: *Deleted

## 2011-11-15 ENCOUNTER — Ambulatory Visit (HOSPITAL_COMMUNITY)
Admission: RE | Admit: 2011-11-15 | Discharge: 2011-11-15 | Disposition: A | Discharge status: Home / Self Care | Payer: Private Health Insurance - Indemnity | Source: Ambulatory Visit | Attending: General Surgery | Admitting: General Surgery

## 2011-11-15 DIAGNOSIS — Z6841 Body Mass Index (BMI) 40.0 and over, adult: Secondary | ICD-10-CM | POA: Insufficient documentation

## 2011-11-15 DIAGNOSIS — Z01818 Encounter for other preprocedural examination: Secondary | ICD-10-CM | POA: Insufficient documentation

## 2011-11-15 DIAGNOSIS — K449 Diaphragmatic hernia without obstruction or gangrene: Secondary | ICD-10-CM | POA: Insufficient documentation

## 2011-11-15 DIAGNOSIS — K589 Irritable bowel syndrome without diarrhea: Secondary | ICD-10-CM | POA: Insufficient documentation

## 2011-11-21 ENCOUNTER — Encounter: Payer: Private Health Insurance - Indemnity | Attending: General Surgery | Admitting: *Deleted

## 2011-11-21 ENCOUNTER — Encounter: Payer: Self-pay | Admitting: *Deleted

## 2011-11-21 DIAGNOSIS — Z01818 Encounter for other preprocedural examination: Secondary | ICD-10-CM | POA: Insufficient documentation

## 2011-11-21 DIAGNOSIS — Z713 Dietary counseling and surveillance: Secondary | ICD-10-CM | POA: Insufficient documentation

## 2011-11-21 NOTE — Progress Notes (Signed)
  Pre-Op Assessment Visit: Pre-Operative LAGB Surgery  Medical Nutrition Therapy:  Appt start time: 0900 end time:  1000.  Patient was seen on 11/21/2011 for Pre-Operative LAGB Nutrition Assessment. Assessment and letter of approval faxed to [facility] Bariatric Surgery Program coordinator on 11/21/2011.  Approval letter sent to [facility] and will be available in the chart under the media tab.  Handouts given during visit include:  Pre-Op Goals Handout  Patient to call for Pre-Op and Post-Op Nutrition Education at [facility] when surgery is scheduled.

## 2011-11-21 NOTE — Patient Instructions (Signed)
   Follow Pre-Op Nutrition Goals to prepare for LAGB Surgery.   Call the [facility] at [phone] once you have been given your surgery date to enrolled in the Pre-Op Nutrition Class. You will need to attend this nutrition class 3-4 weeks prior to your surgery. 

## 2011-12-12 ENCOUNTER — Telehealth: Payer: Self-pay | Admitting: *Deleted

## 2011-12-12 NOTE — Telephone Encounter (Signed)
I will be happy to prescribe. However the surgeon doing her surgery may not want her on oral contraceptives either because of a high risk of deep vein thrombosis with pills and surgery. She she call his office today and see if he's okay with her going on pills. Also  when she due for her next Depo-Provera shot.

## 2011-12-12 NOTE — Telephone Encounter (Signed)
Left message on vm for pt to check with surgeon if okay to start birth control pills, told pt to call back and let me know.

## 2011-12-12 NOTE — Telephone Encounter (Signed)
Pt called stating she will be having weight loss surgery around June. Pt said that the doctor doing the surgery recommend that she stop taking her depo-provera for a least 6 weeks before her surgery. Pt due for her shot end of this month. So pt asked if she could start on a 28 day birth control pill? Please advise

## 2012-01-18 ENCOUNTER — Other Ambulatory Visit (INDEPENDENT_AMBULATORY_CARE_PROVIDER_SITE_OTHER): Payer: Self-pay | Admitting: General Surgery

## 2012-02-22 ENCOUNTER — Encounter: Payer: Self-pay | Admitting: Gynecology

## 2012-02-22 DIAGNOSIS — N879 Dysplasia of cervix uteri, unspecified: Secondary | ICD-10-CM | POA: Insufficient documentation

## 2012-03-06 ENCOUNTER — Encounter: Payer: Self-pay | Admitting: Obstetrics and Gynecology

## 2012-03-06 ENCOUNTER — Other Ambulatory Visit (HOSPITAL_COMMUNITY)
Admission: RE | Admit: 2012-03-06 | Discharge: 2012-03-06 | Disposition: A | Discharge status: Home / Self Care | Payer: Managed Care, Other (non HMO) | Source: Ambulatory Visit | Attending: Obstetrics and Gynecology | Admitting: Obstetrics and Gynecology

## 2012-03-06 ENCOUNTER — Ambulatory Visit (INDEPENDENT_AMBULATORY_CARE_PROVIDER_SITE_OTHER): Payer: Private Health Insurance - Indemnity | Admitting: Obstetrics and Gynecology

## 2012-03-06 VITALS — BP 126/78 | Ht 62.0 in | Wt 312.0 lb

## 2012-03-06 DIAGNOSIS — Z01419 Encounter for gynecological examination (general) (routine) without abnormal findings: Secondary | ICD-10-CM | POA: Insufficient documentation

## 2012-03-06 DIAGNOSIS — Z113 Encounter for screening for infections with a predominantly sexual mode of transmission: Secondary | ICD-10-CM

## 2012-03-06 LAB — HIV ANTIBODY (ROUTINE TESTING W REFLEX): HIV: NONREACTIVE

## 2012-03-06 LAB — RPR

## 2012-03-06 NOTE — Progress Notes (Signed)
Patient came to see me today for her annual GYN exam. She had been using Depo-Provera for birth control. She her last injection in January. She has not been sexually active and has not had any more Depo-Provera. She did have a cycle in April. She is having  lap banding in July. She requested STD testing. She was treated for CIN-3 with a LEEP in 2000. She had one Pap smear in 2006 which showed CIN-1 but all other Paps have been normal and she's back to yearly testing. We discussed new guidelines but she would still like to be screened yearly. She is having no pelvic pain. She is done her routine lab elsewhere.  Physical examination: Kennon Portela present. HEENT within normal limits. Neck: Thyroid not large. No masses. Supraclavicular nodes: not enlarged. Breasts: Examined in both sitting and lying  position. No skin changes and no masses. Abdomen: Soft no guarding rebound or masses or hernia. Pelvic: External: Within normal limits. BUS: Within normal limits. Vaginal:within normal limits. Good estrogen effect. No evidence of cystocele rectocele or enterocele. Cervix: clean. Uterus: Normal size and shape. Adnexa: No masses. Rectovaginal exam: Confirmatory and negative. Extremities: Within normal limits.  Assessment: Normal GYN exam. History of CIN-3.  Plan: STD testing and Pap done. Discussed a Mirena IUD for birth control. Patient is interested and will call me for approval when necessary.

## 2012-03-07 LAB — URINALYSIS W MICROSCOPIC + REFLEX CULTURE
Crystals: NONE SEEN
Ketones, ur: NEGATIVE mg/dL
Leukocytes, UA: NEGATIVE
Nitrite: NEGATIVE
Specific Gravity, Urine: 1.019 (ref 1.005–1.030)
Urobilinogen, UA: 0.2 mg/dL (ref 0.0–1.0)

## 2012-03-07 LAB — GC/CHLAMYDIA PROBE AMP, GENITAL: Chlamydia, DNA Probe: NEGATIVE

## 2012-03-12 ENCOUNTER — Telehealth: Payer: Self-pay | Admitting: *Deleted

## 2012-03-12 NOTE — Telephone Encounter (Signed)
Pt informed of recent lab results on 03/06/12

## 2012-03-21 ENCOUNTER — Encounter: Payer: Managed Care, Other (non HMO) | Attending: General Surgery | Admitting: *Deleted

## 2012-03-21 DIAGNOSIS — Z713 Dietary counseling and surveillance: Secondary | ICD-10-CM | POA: Insufficient documentation

## 2012-03-21 DIAGNOSIS — Z01818 Encounter for other preprocedural examination: Secondary | ICD-10-CM | POA: Insufficient documentation

## 2012-03-21 DIAGNOSIS — E669 Obesity, unspecified: Secondary | ICD-10-CM

## 2012-03-21 NOTE — Progress Notes (Signed)
  Bariatric Class:  Appt start time: 0830 end time:  0930.  Pre-Operative Nutrition Class  Patient was seen on 03/21/2012 for Pre-Operative Bariatric Surgery Education at the [facility].  Surgery date: 04/09/12 Surgery type: LAGB  Samples given per MNT protocol: Bariatric Advantage Multivitamin Lot # [id number] Exp: 09/13  Bariatric Advantage Calcium Citrate Lot # [id number] Exp: 09/13  Celebrate Vitamins Multivitamin Complete - Lot # [id number]; Exp: 11/14 Multivitamin - Lot # [id number]; Exp: 07/14  Celebrate Vitamins Iron 30 mg +C Lot # [id number] Exp:  07/14  [name] Protein Powder Lot # [id number] Exp: 09/14  The following the learning objective met by the patient during this course:   Identifies Pre-Op Dietary Goals and will begin 2 weeks pre-operatively   Identifies appropriate sources of fluids and proteins   States protein recommendations and appropriate sources pre and post-operatively  Identifies Post-Operative Dietary Goals and will follow for 2 weeks post-operatively  Identifies appropriate multivitamin and calcium sources  Describes the need for physical activity post-operatively and will follow MD recommendations  States when to call healthcare provider regarding medication questions or post-operative complications  Handouts given during class include:  Pre-Op Bariatric Surgery Diet Handout  Protein Shake Handout  Post-Op Bariatric Surgery Nutrition Handout  BELT Program Information Flyer  Support Group Information Flyer  Follow-Up Plan: Patient will follow-up at [facility] 2 weeks post operatively for diet advancement per MD.

## 2012-03-21 NOTE — Patient Instructions (Signed)
Follow:   Pre-Op Diet per MD 2 weeks prior to surgery  Phase 2- Liquids (clear/full) 2 weeks after surgery  Vitamin/Mineral/Calcium guidelines for purchasing bariatric supplements  Exercise guidelines pre and post-op per MD  Follow-up at [facility] in 2 weeks post-op for diet advancement. Contact Jeris Roser as needed with questions/concerns. 

## 2012-03-28 ENCOUNTER — Encounter (HOSPITAL_COMMUNITY): Payer: Self-pay | Admitting: Pharmacy Technician

## 2012-03-29 ENCOUNTER — Encounter (HOSPITAL_COMMUNITY)
Admission: RE | Admit: 2012-03-29 | Discharge: 2012-03-29 | Disposition: A | Discharge status: Home / Self Care | Payer: Managed Care, Other (non HMO) | Source: Ambulatory Visit | Attending: General Surgery | Admitting: General Surgery

## 2012-03-29 ENCOUNTER — Encounter (HOSPITAL_COMMUNITY): Payer: Self-pay

## 2012-03-29 HISTORY — DX: Reserved for inherently not codable concepts without codable children: IMO0001

## 2012-03-29 HISTORY — DX: Dorsalgia, unspecified: M54.9

## 2012-03-29 HISTORY — DX: Nocturia: R35.1

## 2012-03-29 LAB — DIFFERENTIAL
Eosinophils Relative: 4 % (ref 0–5)
Lymphocytes Relative: 40 % (ref 12–46)
Lymphs Abs: 3.2 10*3/uL (ref 0.7–4.0)
Monocytes Absolute: 0.6 10*3/uL (ref 0.1–1.0)
Monocytes Relative: 7 % (ref 3–12)

## 2012-03-29 LAB — CBC
MCH: 28 pg (ref 26.0–34.0)
MCHC: 33.1 g/dL (ref 30.0–36.0)
Platelets: 358 10*3/uL (ref 150–400)
RBC: 4.72 MIL/uL (ref 3.87–5.11)

## 2012-03-29 LAB — COMPREHENSIVE METABOLIC PANEL
ALT: 19 U/L (ref 0–35)
AST: 22 U/L (ref 0–37)
CO2: 27 mEq/L (ref 19–32)
Calcium: 9.7 mg/dL (ref 8.4–10.5)
Sodium: 138 mEq/L (ref 135–145)
Total Protein: 7.6 g/dL (ref 6.0–8.3)

## 2012-03-29 NOTE — Patient Instructions (Addendum)
[address] WALTERINE AMODEI  03/29/2012   Your procedure is scheduled on:  04/09/12 AT 7:15 AM  Report to SHORT STAY DEPT  at 5:15 AM.  Call this number if you have problems the morning of surgery: [phone]   Remember:   Do not eat food or drink liquids AFTER MIDNIGHT  May have clear liquids UNTIL 6 HOURS BEFORE SURGERY  Clear liquids include soda, tea, black coffee, apple or grape juice, broth.  Take these medicines the morning of surgery with A SIP OF WATER:CARAFATE / NORCO /WELLBUTRIN / ZANTAC / CLARITIN   Do not wear jewelry, make-up or nail polish.  Do not wear lotions, powders, or perfumes.   Do not shave legs or underarms 48 hrs. before surgery (men may shave face)  Do not bring valuables to the hospital.  Contacts, dentures or bridgework may not be worn into surgery.  Leave suitcase in the car. After surgery it may be brought to your room.  For patients admitted to the hospital, checkout time is 11:00 AM the day of discharge.   Patients discharged the day of surgery will not be allowed to drive home.    Special Instructions:   Please read over the following fact sheets that you were given: MRSA  Information               SHOWER WITH BETASEPT THE NIGHT BEFORE SURGERY AND THE MORNING OF SURGERY               STOP ALL ASPIRIN AND HERBAL MEDICATIONS AND IBUPROFEN / ALEVE / MOTRIN 5-7 DAYS PREOP

## 2012-04-04 ENCOUNTER — Ambulatory Visit (INDEPENDENT_AMBULATORY_CARE_PROVIDER_SITE_OTHER): Payer: Managed Care, Other (non HMO) | Admitting: General Surgery

## 2012-04-04 ENCOUNTER — Encounter (INDEPENDENT_AMBULATORY_CARE_PROVIDER_SITE_OTHER): Payer: Self-pay | Admitting: General Surgery

## 2012-04-04 VITALS — BP 100/78 | HR 78 | Resp 12 | Ht 62.0 in | Wt 307.0 lb

## 2012-04-04 DIAGNOSIS — E669 Obesity, unspecified: Secondary | ICD-10-CM

## 2012-04-04 NOTE — Progress Notes (Signed)
Chief complaint: Preoperative band  History: Patient returns for preoperative visit prior to planned placement of laparoscopic gastric band. She has progressive morbid obesity unresponsive to medical management and comorbidities including chronic back pain secondary to ruptured disc and reflux. We reviewed her preoperative workup. There were no concerns on psychologic or nutritional evaluation. Lab work was unremarkable. Upper GI series showed a "tiny" hiatal hernia and was otherwise unremarkable. Past Medical History  Diagnosis Date  . IBS (irritable bowel syndrome)   . Anxiety and depression   . Obesity   . Migraine headache   . Allergic rhinitis   . Sinusitis   . Osteopenia   . DDD (degenerative disc disease)     L4-S1 with chronic back pain  . GERD (gastroesophageal reflux disease)   . Anxiety   . Depression   . Herniated disc     L 3-4, L 4-5, S1  . Hiatal hernia   . Vitamin d deficiency   . Cervical dysplasia   . Frequency   . Nocturia   . Back pain    Past Surgical History  Procedure Date  . Cholecystectomy 1/04  . Leep 2000  . Tonsillectomy 2005  . Septoplasty 2005  . Nasal turbinate reduction 2005  . Wisdom tooth extraction 2003  . Colonoscopy 09/07/2008    small internal hemorrhoids, otherwise normal (biopsies) into terminal ileum  . Esophagogastroduodenoscopy 7/07    hiatal hernia  . Sigmoidoscopy 7/07    ? colitis/proctitis - biopsies normal  . Cervical biopsy  w/ loop electrode excision   . Colposcopy    Current Outpatient Prescriptions  Medication Sig Dispense Refill  . ALPRAZolam (XANAX) 0.5 MG tablet Take 0.5 mg by mouth at bedtime as needed. Anxiety and sleep      . Aspirin Effervescent (ALKA-SELTZER EXTRA STRENGTH PO) Take 1 lozenge by mouth as needed. For gas      . BIOTIN PO Take 1,000 mg by mouth every other day.       Marland Kitchen buPROPion (WELLBUTRIN) 100 MG tablet Take 200 mg by mouth daily.       . caffeine 200 MG TABS Take 200 mg by mouth as needed.  energy      . Calcium Carbonate-Vitamin D (CALCIUM + D) 600-200 MG-UNIT TABS Take 1 tablet by mouth 2 (two) times daily.      . chlorzoxazone (PARAFON) 500 MG tablet Take 250-500 mg by mouth 3 (three) times daily as needed. TAKES 1/2 -1 TABLET 3 TIMES DAILY      . Cholecalciferol (VITAMIN D) 2000 UNITS CAPS Take 2 capsules by mouth daily.       . citalopram (CELEXA) 40 MG tablet Take 40 mg by mouth daily.      Marland Kitchen dicyclomine (BENTYL) 20 MG tablet Take 1 tablet (20 mg total) by mouth 2 (two) times daily as needed.  60 tablet  11  . Diphenhydramine-PE-APAP (BENADRYL ALLERGY/SINUS HEADACH) 12.5-5-325 MG TABS Take 1-2 tablets by mouth as needed. ALLERGIES AND HEADACHE      . HYDROcodone-acetaminophen (NORCO) 5-325 MG per tablet Take 1 tablet by mouth every 8 (eight) hours as needed. pain      . ibuprofen (ADVIL,MOTRIN) 200 MG tablet Take 800 mg by mouth as needed. PAIN      . loperamide (IMODIUM A-D) 2 MG tablet Take 2 mg by mouth 4 (four) times daily as needed. DIARRHEA      . loratadine (CLARITIN) 10 MG tablet Take 10 mg by mouth daily.      Marland Kitchen  meclizine (ANTIVERT) 25 MG tablet Take 25 mg by mouth 3 (three) times daily as needed. DIZZINESS      . meloxicam (MOBIC) 15 MG tablet Take 15 mg by mouth as needed.       . Omega-3 Fatty Acids (ULTRA OMEGA-3 FISH OIL PO) Take 1 capsule by mouth daily.      . ranitidine (ZANTAC) 150 MG tablet Take 150 mg by mouth 2 (two) times daily.      . sucralfate (CARAFATE) 1 G tablet Take 1 g by mouth 2 (two) times daily.       Allergies  Allergen Reactions  . Levofloxacin     REACTION: RASH  . Sulfasalazine     REACTION: RASH   General: Alert, well-developed morbidly obese African American female, in no distress  Skin: Warm and dry without rash or infection.  HEENT: No palpable masses or thyromegaly. Sclera nonicteric. Pupils equal round and reactive. Oropharynx clear.  Lymph nodes: No cervical, supraclavicular, or inguinal nodes palpable.  Lungs: Breath sounds  clear and equal without increased work of breathing  Cardiovascular: Regular rate and rhythm without murmur. No JVD or edema. Peripheral pulses intact.  Abdomen: Nondistended. Soft and nontender. No masses palpable. No organomegaly. No palpable hernias.  Extremities: No edema or joint swelling or deformity. No chronic venous stasis changes.  Neurologic: Alert and fully oriented. Gait normal.    Assessment and plan: Morbid obesity for lap band placement. Review the consent form and all her questions were answered. I will plan to look for her hiatal hernia and repaired this with a significant. we will plan overnight hospitalization.

## 2012-04-09 ENCOUNTER — Ambulatory Visit (HOSPITAL_COMMUNITY): Payer: Managed Care, Other (non HMO) | Admitting: Registered Nurse

## 2012-04-09 ENCOUNTER — Encounter (HOSPITAL_COMMUNITY): Payer: Self-pay | Admitting: *Deleted

## 2012-04-09 ENCOUNTER — Ambulatory Visit (HOSPITAL_COMMUNITY)
Admission: RE | Admit: 2012-04-09 | Discharge: 2012-04-10 | Disposition: A | Discharge status: Home / Self Care | Payer: Managed Care, Other (non HMO) | Source: Ambulatory Visit | Attending: General Surgery | Admitting: General Surgery

## 2012-04-09 ENCOUNTER — Encounter (HOSPITAL_COMMUNITY): Payer: Self-pay | Admitting: Registered Nurse

## 2012-04-09 ENCOUNTER — Encounter (HOSPITAL_COMMUNITY)
Admission: RE | Disposition: A | Discharge status: Home / Self Care | Payer: Self-pay | Source: Ambulatory Visit | Attending: General Surgery

## 2012-04-09 DIAGNOSIS — K210 Gastro-esophageal reflux disease with esophagitis: Secondary | ICD-10-CM

## 2012-04-09 DIAGNOSIS — Z01812 Encounter for preprocedural laboratory examination: Secondary | ICD-10-CM | POA: Insufficient documentation

## 2012-04-09 DIAGNOSIS — M899 Disorder of bone, unspecified: Secondary | ICD-10-CM | POA: Insufficient documentation

## 2012-04-09 DIAGNOSIS — E669 Obesity, unspecified: Secondary | ICD-10-CM

## 2012-04-09 DIAGNOSIS — Z6841 Body Mass Index (BMI) 40.0 and over, adult: Secondary | ICD-10-CM

## 2012-04-09 DIAGNOSIS — K449 Diaphragmatic hernia without obstruction or gangrene: Secondary | ICD-10-CM

## 2012-04-09 DIAGNOSIS — K219 Gastro-esophageal reflux disease without esophagitis: Secondary | ICD-10-CM | POA: Insufficient documentation

## 2012-04-09 DIAGNOSIS — K589 Irritable bowel syndrome without diarrhea: Secondary | ICD-10-CM | POA: Insufficient documentation

## 2012-04-09 DIAGNOSIS — Z79899 Other long term (current) drug therapy: Secondary | ICD-10-CM | POA: Insufficient documentation

## 2012-04-09 DIAGNOSIS — Z9089 Acquired absence of other organs: Secondary | ICD-10-CM | POA: Insufficient documentation

## 2012-04-09 HISTORY — PX: LAPAROSCOPIC GASTRIC BANDING: SHX1100

## 2012-04-09 LAB — PREGNANCY, URINE: Preg Test, Ur: NEGATIVE

## 2012-04-09 SURGERY — GASTRIC BANDING, LAPAROSCOPIC
Anesthesia: General | Site: Abdomen | Wound class: Clean

## 2012-04-09 MED ORDER — POTASSIUM CHLORIDE IN NACL 20-0.9 MEQ/L-% IV SOLN
INTRAVENOUS | Status: DC
  Administered 2012-04-09 (×2): via INTRAVENOUS
  Filled 2012-04-09 (×3): qty 1000

## 2012-04-09 MED ORDER — GLYCOPYRROLATE 0.2 MG/ML IJ SOLN
INTRAMUSCULAR | Status: DC | PRN
  Administered 2012-04-09: .6 mg via INTRAVENOUS

## 2012-04-09 MED ORDER — ONDANSETRON HCL 4 MG/2ML IJ SOLN
INTRAMUSCULAR | Status: DC | PRN
  Administered 2012-04-09: 4 mg via INTRAVENOUS

## 2012-04-09 MED ORDER — BUPIVACAINE-EPINEPHRINE 0.5% -1:200000 IJ SOLN
INTRAMUSCULAR | Status: DC | PRN
  Administered 2012-04-09: 50 mL

## 2012-04-09 MED ORDER — PHENYLEPHRINE HCL 10 MG/ML IJ SOLN
INTRAMUSCULAR | Status: DC | PRN
  Administered 2012-04-09: 80 ug via INTRAVENOUS
  Administered 2012-04-09: 40 ug via INTRAVENOUS
  Administered 2012-04-09 (×2): 80 ug via INTRAVENOUS

## 2012-04-09 MED ORDER — SUFENTANIL CITRATE 50 MCG/ML IV SOLN
INTRAVENOUS | Status: DC | PRN
  Administered 2012-04-09: 5 ug via INTRAVENOUS
  Administered 2012-04-09 (×2): 10 ug via INTRAVENOUS
  Administered 2012-04-09 (×2): 5 ug via INTRAVENOUS

## 2012-04-09 MED ORDER — PROMETHAZINE HCL 25 MG/ML IJ SOLN: 6.2500 mg | INTRAMUSCULAR | Status: DC | PRN

## 2012-04-09 MED ORDER — UNJURY CHICKEN SOUP POWDER: 2.0000 [oz_av] | Freq: Four times a day (QID) | ORAL | Status: DC

## 2012-04-09 MED ORDER — LIDOCAINE HCL 4 % MT SOLN
OROMUCOSAL | Status: DC | PRN
  Administered 2012-04-09: 4 mL via TOPICAL

## 2012-04-09 MED ORDER — EPHEDRINE SULFATE 50 MG/ML IJ SOLN
INTRAMUSCULAR | Status: DC | PRN
  Administered 2012-04-09: 5 mg via INTRAVENOUS

## 2012-04-09 MED ORDER — MORPHINE SULFATE 2 MG/ML IJ SOLN
2.0000 mg | INTRAMUSCULAR | Status: DC | PRN
  Administered 2012-04-09 (×3): 2 mg via INTRAVENOUS
  Filled 2012-04-09 (×4): qty 1

## 2012-04-09 MED ORDER — MECLIZINE HCL 25 MG PO TABS
25.0000 mg | ORAL_TABLET | Freq: Three times a day (TID) | ORAL | Status: DC | PRN
  Filled 2012-04-09: qty 1

## 2012-04-09 MED ORDER — HEPARIN SODIUM (PORCINE) 5000 UNIT/ML IJ SOLN
5000.0000 [IU] | INTRAMUSCULAR | Status: AC
  Administered 2012-04-09: 5000 [IU] via SUBCUTANEOUS

## 2012-04-09 MED ORDER — OXYCODONE-ACETAMINOPHEN 5-325 MG/5ML PO SOLN
5.0000 mL | ORAL | Status: DC | PRN
  Administered 2012-04-09 – 2012-04-10 (×3): 5 mL via ORAL
  Filled 2012-04-09 (×3): qty 5

## 2012-04-09 MED ORDER — HYDROMORPHONE HCL PF 1 MG/ML IJ SOLN
INTRAMUSCULAR | Status: AC
  Filled 2012-04-09: qty 1

## 2012-04-09 MED ORDER — SODIUM CHLORIDE 0.9 % IJ SOLN
INTRAMUSCULAR | Status: DC | PRN
  Administered 2012-04-09: 20 mL via INTRAVENOUS

## 2012-04-09 MED ORDER — BUPIVACAINE-EPINEPHRINE (PF) 0.5% -1:200000 IJ SOLN
INTRAMUSCULAR | Status: AC
  Filled 2012-04-09: qty 10

## 2012-04-09 MED ORDER — ACETAMINOPHEN 10 MG/ML IV SOLN
INTRAVENOUS | Status: DC | PRN
  Administered 2012-04-09: 1000 mg via INTRAVENOUS

## 2012-04-09 MED ORDER — NEOSTIGMINE METHYLSULFATE 1 MG/ML IJ SOLN
INTRAMUSCULAR | Status: DC | PRN
  Administered 2012-04-09: 4 mg via INTRAVENOUS

## 2012-04-09 MED ORDER — NEOSTIGMINE METHYLSULFATE 1 MG/ML IJ SOLN: INTRAMUSCULAR | Status: DC | PRN

## 2012-04-09 MED ORDER — SUCCINYLCHOLINE CHLORIDE 20 MG/ML IJ SOLN
INTRAMUSCULAR | Status: DC | PRN
  Administered 2012-04-09: 140 mg via INTRAVENOUS

## 2012-04-09 MED ORDER — LACTATED RINGERS IV SOLN
INTRAVENOUS | Status: DC | PRN
  Administered 2012-04-09 (×4): via INTRAVENOUS

## 2012-04-09 MED ORDER — UNJURY CHOCOLATE CLASSIC POWDER: 2.0000 [oz_av] | Freq: Four times a day (QID) | ORAL | Status: DC

## 2012-04-09 MED ORDER — HEPARIN SODIUM (PORCINE) 5000 UNIT/ML IJ SOLN
5000.0000 [IU] | Freq: Three times a day (TID) | INTRAMUSCULAR | Status: DC
  Administered 2012-04-09 – 2012-04-10 (×2): 5000 [IU] via SUBCUTANEOUS
  Filled 2012-04-09 (×5): qty 1

## 2012-04-09 MED ORDER — LIDOCAINE HCL (CARDIAC) 20 MG/ML IV SOLN
INTRAVENOUS | Status: DC | PRN
  Administered 2012-04-09: 50 mg via INTRAVENOUS

## 2012-04-09 MED ORDER — ACETAMINOPHEN 160 MG/5ML PO SOLN
650.0000 mg | ORAL | Status: DC | PRN
  Administered 2012-04-09: 650 mg via ORAL
  Filled 2012-04-09: qty 20.3

## 2012-04-09 MED ORDER — CHLORHEXIDINE GLUCONATE 0.12 % MT SOLN
15.0000 mL | Freq: Two times a day (BID) | OROMUCOSAL | Status: DC
  Administered 2012-04-09 – 2012-04-10 (×2): 15 mL via OROMUCOSAL
  Filled 2012-04-09 (×4): qty 15

## 2012-04-09 MED ORDER — ROCURONIUM BROMIDE 100 MG/10ML IV SOLN
INTRAVENOUS | Status: DC | PRN
  Administered 2012-04-09: 50 mg via INTRAVENOUS
  Administered 2012-04-09: 10 mg via INTRAVENOUS

## 2012-04-09 MED ORDER — DEXAMETHASONE SODIUM PHOSPHATE 10 MG/ML IJ SOLN
INTRAMUSCULAR | Status: DC | PRN
  Administered 2012-04-09: 10 mg via INTRAVENOUS

## 2012-04-09 MED ORDER — PANTOPRAZOLE SODIUM 40 MG IV SOLR
40.0000 mg | Freq: Every day | INTRAVENOUS | Status: DC
  Administered 2012-04-09: 40 mg via INTRAVENOUS
  Filled 2012-04-09 (×2): qty 40

## 2012-04-09 MED ORDER — ONDANSETRON HCL 4 MG/2ML IJ SOLN: 4.0000 mg | INTRAMUSCULAR | Status: DC | PRN

## 2012-04-09 MED ORDER — UNJURY VANILLA POWDER
2.0000 [oz_av] | Freq: Four times a day (QID) | ORAL | Status: DC
  Administered 2012-04-10: 2 [oz_av] via ORAL

## 2012-04-09 MED ORDER — KETOROLAC TROMETHAMINE 30 MG/ML IJ SOLN: 15.0000 mg | Freq: Once | INTRAMUSCULAR | Status: DC | PRN

## 2012-04-09 MED ORDER — LACTATED RINGERS IV SOLN: INTRAVENOUS | Status: DC

## 2012-04-09 MED ORDER — BIOTENE DRY MOUTH MT LIQD
15.0000 mL | Freq: Two times a day (BID) | OROMUCOSAL | Status: DC
  Administered 2012-04-09: 15 mL via OROMUCOSAL

## 2012-04-09 MED ORDER — HYDROMORPHONE HCL PF 1 MG/ML IJ SOLN
0.2500 mg | INTRAMUSCULAR | Status: DC | PRN
  Administered 2012-04-09 (×2): 0.5 mg via INTRAVENOUS

## 2012-04-09 MED ORDER — PROPOFOL 10 MG/ML IV EMUL
INTRAVENOUS | Status: DC | PRN
  Administered 2012-04-09: 40 mg via INTRAVENOUS
  Administered 2012-04-09: 280 mg via INTRAVENOUS

## 2012-04-09 MED ORDER — HEPARIN SODIUM (PORCINE) 5000 UNIT/ML IJ SOLN
INTRAMUSCULAR | Status: AC
  Filled 2012-04-09: qty 1

## 2012-04-09 MED ORDER — DEXTROSE 5 % IV SOLN
2.0000 g | INTRAVENOUS | Status: AC
  Administered 2012-04-09: 2 g via INTRAVENOUS
  Filled 2012-04-09: qty 2

## 2012-04-09 MED ORDER — MIDAZOLAM HCL 5 MG/5ML IJ SOLN
INTRAMUSCULAR | Status: DC | PRN
  Administered 2012-04-09 (×2): 2 mg via INTRAVENOUS

## 2012-04-09 MED ORDER — ACETAMINOPHEN 10 MG/ML IV SOLN
INTRAVENOUS | Status: AC
  Filled 2012-04-09: qty 100

## 2012-04-09 MED ORDER — ALPRAZOLAM 0.5 MG PO TABS: 0.5000 mg | ORAL_TABLET | Freq: Every evening | ORAL | Status: DC | PRN

## 2012-04-09 SURGICAL SUPPLY — 60 items
ADH SKN CLS APL DERMABOND .7 (GAUZE/BANDAGES/DRESSINGS) ×2
BAND LAP 10.0 W/TUBES (Band) ×1 IMPLANT
BLADE HEX COATED 2.75 (ELECTRODE) ×2 IMPLANT
BLADE SURG 15 STRL LF DISP TIS (BLADE) IMPLANT
BLADE SURG 15 STRL SS (BLADE) ×2
BLADE SURG SZ11 CARB STEEL (BLADE) ×2 IMPLANT
CABLE HIGH FREQUENCY MONO STRZ (ELECTRODE) ×2 IMPLANT
CANISTER SUCTION 2500CC (MISCELLANEOUS) ×2 IMPLANT
CLOTH BEACON ORANGE TIMEOUT ST (SAFETY) ×2 IMPLANT
DECANTER SPIKE VIAL GLASS SM (MISCELLANEOUS) ×4 IMPLANT
DERMABOND ADVANCED (GAUZE/BANDAGES/DRESSINGS) ×2
DERMABOND ADVANCED .7 DNX12 (GAUZE/BANDAGES/DRESSINGS) ×1 IMPLANT
DEVICE SUT QUICK LOAD TK 5 (STAPLE) ×8 IMPLANT
DEVICE SUT TI-KNOT TK 5X26 (MISCELLANEOUS) ×2 IMPLANT
DEVICE SUTURE ENDOST 10MM (ENDOMECHANICALS) ×1 IMPLANT
DRAPE CAMERA CLOSED 9X96 (DRAPES) ×2 IMPLANT
ELECT REM PT RETURN 9FT ADLT (ELECTROSURGICAL) ×2
ELECTRODE REM PT RTRN 9FT ADLT (ELECTROSURGICAL) ×1 IMPLANT
GLOVE BIOGEL PI IND STRL 7.0 (GLOVE) ×1 IMPLANT
GLOVE BIOGEL PI INDICATOR 7.0 (GLOVE) ×1
GLOVE SS BIOGEL STRL SZ 7.5 (GLOVE) ×1 IMPLANT
GLOVE SUPERSENSE BIOGEL SZ 7.5 (GLOVE) ×1
GOWN STRL NON-REIN LRG LVL3 (GOWN DISPOSABLE) ×3 IMPLANT
GOWN STRL REIN XL XLG (GOWN DISPOSABLE) ×5 IMPLANT
HOVERMATT SINGLE USE (MISCELLANEOUS) ×2 IMPLANT
KIT BASIN OR (CUSTOM PROCEDURE TRAY) ×2 IMPLANT
MESH HERNIA 1X4 RECT BARD (Mesh General) IMPLANT
MESH HERNIA BARD 1X4 (Mesh General) ×1 IMPLANT
NDL SPNL 22GX3.5 QUINCKE BK (NEEDLE) ×1 IMPLANT
NEEDLE SPNL 22GX3.5 QUINCKE BK (NEEDLE) ×2 IMPLANT
NS IRRIG 1000ML POUR BTL (IV SOLUTION) ×2 IMPLANT
PACK UNIVERSAL I (CUSTOM PROCEDURE TRAY) ×2 IMPLANT
PENCIL BUTTON HOLSTER BLD 10FT (ELECTRODE) ×2 IMPLANT
SCALPEL HARMONIC ACE (MISCELLANEOUS) IMPLANT
SET TUBE IRRIG SUCTION NO TIP (IRRIGATION / IRRIGATOR) ×1 IMPLANT
SLEEVE ADV FIXATION 5X100MM (TROCAR) ×4 IMPLANT
SOLUTION ANTI FOG 6CC (MISCELLANEOUS) ×2 IMPLANT
SPONGE LAP 18X18 X RAY DECT (DISPOSABLE) ×2 IMPLANT
STAPLER VISISTAT 35W (STAPLE) ×2 IMPLANT
SUT ETHIBOND 2 0 SH (SUTURE) ×6
SUT ETHIBOND 2 0 SH 36X2 (SUTURE) ×3 IMPLANT
SUT MNCRL AB 4-0 PS2 18 (SUTURE) ×2 IMPLANT
SUT PROLENE 2 0 CT2 30 (SUTURE) ×3 IMPLANT
SUT SILK 0 (SUTURE) ×2
SUT SILK 0 30XBRD TIE 6 (SUTURE) ×1 IMPLANT
SUT SURGIDAC NAB ES-9 0 48 120 (SUTURE) ×1 IMPLANT
SUT VIC AB 2-0 SH 27 (SUTURE) ×2
SUT VIC AB 2-0 SH 27X BRD (SUTURE) ×1 IMPLANT
SYR 20CC LL (SYRINGE) ×2 IMPLANT
SYR CONTROL 10ML LL (SYRINGE) ×2 IMPLANT
SYS KII OPTICAL ACCESS 15MM (TROCAR) ×2
SYSTEM KII OPTICAL ACCESS 15MM (TROCAR) ×1 IMPLANT
TOWEL OR 17X26 10 PK STRL BLUE (TOWEL DISPOSABLE) ×3 IMPLANT
TOWEL OR NON WOVEN STRL DISP B (DISPOSABLE) ×1 IMPLANT
TROCAR ADV FIXATION 11X100MM (TROCAR) IMPLANT
TROCAR BLADELESS OPT 5 100 (ENDOMECHANICALS) ×2 IMPLANT
TROCAR XCEL NON-BLD 11X100MML (ENDOMECHANICALS) ×1 IMPLANT
TROCAR Z-THREAD FIOS 5X100MM (TROCAR) ×2 IMPLANT
TUBE CALIBRATION LAPBAND (TUBING) ×2 IMPLANT
TUBING INSUFFLATION 10FT LAP (TUBING) ×2 IMPLANT

## 2012-04-09 NOTE — Interval H&P Note (Signed)
History and Physical Interval Note:  04/09/2012 7:05 AM  Janet Blanchard  has presented today for surgery, with the diagnosis of morbid obesity   The various methods of treatment have been discussed with the patient and family. After consideration of risks, benefits and other options for treatment, the patient has consented to  Procedure(s) (LRB): LAPAROSCOPIC GASTRIC BANDING (N/A) as a surgical intervention .  The patient's history has been reviewed, patient examined, no change in status, stable for surgery.  I have reviewed the patients' chart and labs.  Questions were answered to the patient's satisfaction.     Samary Shatz T

## 2012-04-09 NOTE — Anesthesia Preprocedure Evaluation (Addendum)
Anesthesia Evaluation  Patient identified by MRN, date of birth, ID band Patient awake    Reviewed: Allergy & Precautions, H&P , NPO status , Patient's Chart, lab work & pertinent test results  Airway Mallampati: III TM Distance: <3 FB Neck ROM: Full    Dental No notable dental hx.    Pulmonary neg pulmonary ROS,  breath sounds clear to auscultation  + decreased breath sounds      Cardiovascular negative cardio ROS  Rhythm:Regular Rate:Normal     Neuro/Psych negative neurological ROS  negative psych ROS   GI/Hepatic Neg liver ROS, hiatal hernia, GERD-  ,  Endo/Other  Morbid obesity  Renal/GU negative Renal ROS  negative genitourinary   Musculoskeletal negative musculoskeletal ROS (+)   Abdominal   Peds negative pediatric ROS (+)  Hematology negative hematology ROS (+)   Anesthesia Other Findings   Reproductive/Obstetrics negative OB ROS                          Anesthesia Physical Anesthesia Plan  ASA: III  Anesthesia Plan: General   Post-op Pain Management:    Induction: Intravenous  Airway Management Planned: Oral ETT  Additional Equipment:   Intra-op Plan:   Post-operative Plan: Extubation in OR  Informed Consent: I have reviewed the patients History and Physical, chart, labs and discussed the procedure including the risks, benefits and alternatives for the proposed anesthesia with the patient or authorized representative who has indicated his/her understanding and acceptance.   Dental advisory given  Plan Discussed with: CRNA  Anesthesia Plan Comments:         Anesthesia Quick Evaluation

## 2012-04-09 NOTE — Op Note (Signed)
Preop diagnosis: Morbid obesity   Postop diagnosis: Same and Hiatal Hernia  Surgical procedure: Placement of laparoscopic adjustable gastric band with hiatal hernia repair  Surgeon:Emmakate Hypes M.D.  Assistant.: Gaynelle Adu M.D.  Anesthesia: General  Complications: None  EBL: Minimal  Description of procedure: Patient is brought to the operating room, placed in the supine position on the operating table and general anesthesia was induced. Patient did receive preoperative IV antibiotics and subcutaneous heparin and PAS were in place. The abdomen was widely sterilely prepped and draped and correct patient and procedure verified with the patient time out. Trocar sites were infiltrated with local anesthesia. Access was obtained with a 5 mm Optiview trocar in the left upper quadrant without difficulty and pneumoperitoneum was established. There was no evidence of trocar injury. Under direct vision a 5 mm trocar was placed laterally in the right upper quadrant, a 15 mm trocar in the right upper quadrant midclavicular line, and an 5 mm trocar just above and to the left of the umbilicus for the camera port. The patient was placed in steep reverse Trendelenburg position and and through a 5 mm subxiphoid site the [facility] retractor was placed in the left lobe of the liver elevated with actual exposure of the hiatus and upper stomach. Initially the peritoneum over the left crus was incised and careful blunt dissection carried back down along the left crus toward the retrogastric area. The pars flaccida was opened in an avascular area in the base of the right crus at the area of crossing fat was exposed. At this point the sizing tube was passed orally by anesthesia into the stomach and with the balloon inflated to 15 cc the balloon pulled back through the hiatus with mild traction. The balloon was deflated and the tube withdrawn into the upper esophagus. I elected to proceed with repair of the hiatal  hernia. The patient does have reflux preoperatively and upper GI series has shown a "tiny" hiatal hernia. The anterior right crus was mobilized and careful blunt dissection was carried into the retroesophageal space. The left crus was identified as well as the esophagus which was carefully protected. There was a moderate sized hiatal hernia identified and this was closed posteriorly with 2 interrupted 0 Ethibond crural sutures. At this point the sizing tube was passed back down into the stomach and with the balloon inflated to 10 cc it pulled back snugly against the hiatus. The balloon was desufflated and the tube again pulled back into the upper esophagus. The peritoneum at the area of crossing fat on the right crus was incised and the finger dissector was passed into this space and advanced retrogastric without difficulty and deployed up through the previously dissected area at the angle of Hiss. A flushed AP standard lap band system was introduced into the abdomen. The tubing was placed in the finger dissector and brought retrogastric and the band brought back through the retrogastric tunnel without difficulty. With the sizing tube placed orally into the stomach the band was locked into position without any undue tension and the sizing tube was removed. Holding the tubing toward the patient's feet exposing the small gastric pouch the fundus was sutured up over the band to the pouch with 3 interrupted 2-0 Ethibond sutures. The band appeared to be in excellent position. There was no evidence of bleeding or trocar injury or other problems. The Nathanson retractor was removed under direct vision. All CO2 was evacuated and trochars removed. The tubing which had been brought through  the right mid abdominal trocar site was cut and attached to the port to which a piece of Prolene mesh had been sutured to the back. This incision was lengthened slightly in a subcutaneous pocket created. The port was positioned  subcutaneously and the tubing introduced bluntly into the abdomen. This incision was closed with subcutaneous running 2-0 Vicryl and all skin incisions were closed with subcutaneous 4-0 Monocryl and Dermabond. Sponge needle and instrument counts were correct. The patient was taken to the PACU in good condition.  [name] MD, FACS  08/22/2011, 9:11 AM

## 2012-04-09 NOTE — Anesthesia Postprocedure Evaluation (Signed)
  Anesthesia Post-op Note  Patient: Janet Blanchard  Procedure(s) Performed: Procedure(s) (LRB): LAPAROSCOPIC GASTRIC BANDING (N/A)  Patient Location: PACU  Anesthesia Type: General  Level of Consciousness: awake and alert   Airway and Oxygen Therapy: Patient Spontanous Breathing  Post-op Pain: mild  Post-op Assessment: Post-op Vital signs reviewed, Patient's Cardiovascular Status Stable, Respiratory Function Stable, Patent Airway and No signs of Nausea or vomiting  Post-op Vital Signs: stable  Complications: No apparent anesthesia complications

## 2012-04-09 NOTE — H&P (View-Only) (Signed)
Chief complaint: Preoperative band  History: Patient returns for preoperative visit prior to planned placement of laparoscopic gastric band. She has progressive morbid obesity unresponsive to medical management and comorbidities including chronic back pain secondary to ruptured disc and reflux. We reviewed her preoperative workup. There were no concerns on psychologic or nutritional evaluation. Lab work was unremarkable. Upper GI series showed a "tiny" hiatal hernia and was otherwise unremarkable. Past Medical History  Diagnosis Date  . IBS (irritable bowel syndrome)   . Anxiety and depression   . Obesity   . Migraine headache   . Allergic rhinitis   . Sinusitis   . Osteopenia   . DDD (degenerative disc disease)     L4-S1 with chronic back pain  . GERD (gastroesophageal reflux disease)   . Anxiety   . Depression   . Herniated disc     L 3-4, L 4-5, S1  . Hiatal hernia   . Vitamin d deficiency   . Cervical dysplasia   . Frequency   . Nocturia   . Back pain    Past Surgical History  Procedure Date  . Cholecystectomy 1/04  . Leep 2000  . Tonsillectomy 2005  . Septoplasty 2005  . Nasal turbinate reduction 2005  . Wisdom tooth extraction 2003  . Colonoscopy 09/07/2008    small internal hemorrhoids, otherwise normal (biopsies) into terminal ileum  . Esophagogastroduodenoscopy 7/07    hiatal hernia  . Sigmoidoscopy 7/07    ? colitis/proctitis - biopsies normal  . Cervical biopsy  w/ loop electrode excision   . Colposcopy    Current Outpatient Prescriptions  Medication Sig Dispense Refill  . ALPRAZolam (XANAX) 0.5 MG tablet Take 0.5 mg by mouth at bedtime as needed. Anxiety and sleep      . Aspirin Effervescent (ALKA-SELTZER EXTRA STRENGTH PO) Take 1 lozenge by mouth as needed. For gas      . BIOTIN PO Take 1,000 mg by mouth every other day.       . buPROPion (WELLBUTRIN) 100 MG tablet Take 200 mg by mouth daily.       . caffeine 200 MG TABS Take 200 mg by mouth as needed.  energy      . Calcium Carbonate-Vitamin D (CALCIUM + D) 600-200 MG-UNIT TABS Take 1 tablet by mouth 2 (two) times daily.      . chlorzoxazone (PARAFON) 500 MG tablet Take 250-500 mg by mouth 3 (three) times daily as needed. TAKES 1/2 -1 TABLET 3 TIMES DAILY      . Cholecalciferol (VITAMIN D) 2000 UNITS CAPS Take 2 capsules by mouth daily.       . citalopram (CELEXA) 40 MG tablet Take 40 mg by mouth daily.      . dicyclomine (BENTYL) 20 MG tablet Take 1 tablet (20 mg total) by mouth 2 (two) times daily as needed.  60 tablet  11  . Diphenhydramine-PE-APAP (BENADRYL ALLERGY/SINUS HEADACH) 12.5-5-325 MG TABS Take 1-2 tablets by mouth as needed. ALLERGIES AND HEADACHE      . HYDROcodone-acetaminophen (NORCO) 5-325 MG per tablet Take 1 tablet by mouth every 8 (eight) hours as needed. pain      . ibuprofen (ADVIL,MOTRIN) 200 MG tablet Take 800 mg by mouth as needed. PAIN      . loperamide (IMODIUM A-D) 2 MG tablet Take 2 mg by mouth 4 (four) times daily as needed. DIARRHEA      . loratadine (CLARITIN) 10 MG tablet Take 10 mg by mouth daily.      .   meclizine (ANTIVERT) 25 MG tablet Take 25 mg by mouth 3 (three) times daily as needed. DIZZINESS      . meloxicam (MOBIC) 15 MG tablet Take 15 mg by mouth as needed.       . Omega-3 Fatty Acids (ULTRA OMEGA-3 FISH OIL PO) Take 1 capsule by mouth daily.      . ranitidine (ZANTAC) 150 MG tablet Take 150 mg by mouth 2 (two) times daily.      . sucralfate (CARAFATE) 1 G tablet Take 1 g by mouth 2 (two) times daily.       Allergies  Allergen Reactions  . Levofloxacin     REACTION: RASH  . Sulfasalazine     REACTION: RASH   General: Alert, well-developed morbidly obese African American female, in no distress  Skin: Warm and dry without rash or infection.  HEENT: No palpable masses or thyromegaly. Sclera nonicteric. Pupils equal round and reactive. Oropharynx clear.  Lymph nodes: No cervical, supraclavicular, or inguinal nodes palpable.  Lungs: Breath sounds  clear and equal without increased work of breathing  Cardiovascular: Regular rate and rhythm without murmur. No JVD or edema. Peripheral pulses intact.  Abdomen: Nondistended. Soft and nontender. No masses palpable. No organomegaly. No palpable hernias.  Extremities: No edema or joint swelling or deformity. No chronic venous stasis changes.  Neurologic: Alert and fully oriented. Gait normal.    Assessment and plan: Morbid obesity for lap band placement. Review the consent form and all her questions were answered. I will plan to look for her hiatal hernia and repaired this with a significant. we will plan overnight hospitalization. 

## 2012-04-09 NOTE — Transfer of Care (Signed)
Immediate Anesthesia Transfer of Care Note  Patient: Janet Blanchard  Procedure(s) Performed: Procedure(s) (LRB): LAPAROSCOPIC GASTRIC BANDING (N/A)  Patient Location: PACU  Anesthesia Type: General  Level of Consciousness: awake, alert , oriented and patient cooperative  Airway & Oxygen Therapy: Patient Spontanous Breathing and Patient connected to face mask oxygen  Post-op Assessment: Report given to PACU RN, Post -op Vital signs reviewed and stable and Patient moving all extremities X 4  Post vital signs: stable  Complications: No apparent anesthesia complications

## 2012-04-10 ENCOUNTER — Encounter (HOSPITAL_COMMUNITY): Payer: Self-pay | Admitting: General Surgery

## 2012-04-10 ENCOUNTER — Inpatient Hospital Stay (HOSPITAL_COMMUNITY): Payer: Managed Care, Other (non HMO)

## 2012-04-10 LAB — DIFFERENTIAL
Eosinophils Absolute: 0 10*3/uL (ref 0.0–0.7)
Lymphs Abs: 1.5 10*3/uL (ref 0.7–4.0)
Monocytes Relative: 6 % (ref 3–12)
Neutrophils Relative %: 79 % — ABNORMAL HIGH (ref 43–77)

## 2012-04-10 LAB — CBC
HCT: 36 % (ref 36.0–46.0)
Hemoglobin: 11.9 g/dL — ABNORMAL LOW (ref 12.0–15.0)
MCH: 27.9 pg (ref 26.0–34.0)
MCV: 84.5 fL (ref 78.0–100.0)
RBC: 4.26 MIL/uL (ref 3.87–5.11)

## 2012-04-10 MED ORDER — OXYCODONE-ACETAMINOPHEN 5-325 MG/5ML PO SOLN: 5.0000 mL | ORAL | Status: DC | PRN

## 2012-04-10 NOTE — Progress Notes (Signed)
Pt alert and oriented sitting up in chair; mother at bedside; VSS; pt tolerating water well, will advance to protein shakes; denies any nausea or vomiting; denies burping with a little flatus; denies BM at this time; voiding without difficulty; ambulating in hallways without difficulty; c/o some abdominal soreness with relief from prn pain meds; pt already has follow up appts with [facility] and [facility]; aware of support group and BELT program; discharge instructions reviewed with the pt and her mother and they verbalized understanding of.  ADJUSTABLE GASTRIC BAND DISCHARGE INSTRUCTIONS  Drs. Fredrik Rigger, Hoxworth, Wilson, and Hershey Call if you have any problems.   Call [phone] and ask for the surgeon on call.    If you need immediate assistance come to the ER at [facility]. Tell the ER personnel that you are a new post-op gastric banding patient. Signs and symptoms to report:   Severe vomiting or nausea. If you cannot tolerate clear liquids for longer than 1 day, you need to call your surgeon.    Abdominal pain which does not get better after taking your pain medication   Fever greater than 101 F degree   Difficulty breathing   Chest pain    Redness, swelling, drainage, or foul odor at incision sites    If your incisions open or pull apart   Swelling or pain in calf (lower leg)   Diarrhea, frequent watery, uncontrolled bowel movements.   Constipation, (no bowel movements for 3 days) if this occurs, Take Milk of Magnesia, 2 tablespoons by mouth, 3 times a day for 2 days if needed.  Call your doctor if constipation continues. Stop taking Milk of Magnesia once you have had a bowel movement. You may also use Miralax according to the label instructions.   Anything you consider "abnormal for you".   Normal side effects after Surgery:   Unable to sleep at night or concentrate   Irritability   Being tearful (crying) or depressed   These are common complaints, possibly related to your anesthesia,  stress of surgery and change in lifestyle, that usually go away a few weeks after surgery.  If these feelings continue, call your medical doctor.  Wound Care You may have surgical glue, steri-strips, or staples over your incisions after surgery.  Surgical glue:  Looks like a clear film over your incisions and will wear off gradually. Steri-strips: Strips of tape over your incisions. You may notice a yellowish color on the skin underneath the steri-strips. This is a substance used to make the steri-strips stick better. Do not pull the steri-strips off - let them fall off. Staples: Cherlynn Polo may be removed before you leave the hospital. If you go home with staples, call [facility] [phone]) for an appointment with your surgeon's nurse to have staples removed in 7 - 10 days. Showering: You may shower two days after your surgery unless otherwise instructed by your surgeon. Wash gently around wounds with warm soapy water, rinse well, and gently pat dry.  If you have a drain, you may need someone to hold this while you shower. Avoid tub baths until staples are removed and incisions are healed.    Medications   Medications should be liquid or crushed if larger than the size of a dime.  Extended release pills should not be crushed.   Depending on the size and number of medications you take, you may need to stagger/change the time you take your medications so that you do not over-fill your pouch.  Make sure you follow-up with your primary care physician to make medication adjustments needed during rapid weight loss and life-style adjustment.   If you are diabetic, follow up with the doctor that prescribes your diabetes medication(s) within one week after surgery and check your blood sugar regularly.   Do not drive while taking narcotics!   Do not take acetaminophen (Tylenol) and Roxicet or Lortab Elixir at the same time since these pain medications contain acetaminophen.  Diet at home:  (First 2 Weeks)  You will see the nutritionist two weeks after your surgery. She will advance your diet if you are tolerating liquids well. Once at home, if you have severe vomiting or nausea and cannot tolerate clear liquids lasting longer than 1 day, call your surgeon.  For Same Day Surgery Discharge Patients: The day of surgery drink water only: 2 ounces every 4 hours. If you are tolerating water, begin drinking your high protein shake the next morning. For Overnight Stay Patients: Begin high protein shake 2 ounces every 3 hours, 5 - 6 times per day.  Gradually increase the amount you drink as tolerated.  You may find it easier to slowly sip shakes throughout the day.  It is important to get your proteins in first.   Protein Shake   Drink at least 2 ounces of shake 5-6 times per day   Each serving of protein shakes should have a minimum of 15 grams of protein and no more than 5 grams of carbohydrate    Increase the amount of protein shake you drink as tolerated   Protein powder may be added to fluids such as non-fat milk or Lactaid milk (limit to 20 grams added protein powder per serving   The initial goal is to drink at least 8 ounces of protein shake/drink per day (or as directed by the nutritionist). Some examples of protein shakes are [employer], [employer], EAS Edge HP, and [employer]jury. Hydration   Gradually increase the amount of water and other liquids as tolerated (See Acceptable Fluids)   Gradually increase the amount of protein shake as tolerated     Sip fluids slowly and throughout the day   May use Sugar substitutes, use sparingly (limit to 6 - 8 packets per day).  Your fluid goal is 64 ounces of fluid daily. It may take a few weeks to build up to this.         32 oz (or more) should be clear liquids and 32 oz (or more) should be full liquids.         Liquids should not contain sugar, caffeine, or carbonation!  Acceptable Fluids Clear Liquids:   Water or Sugar-free  flavored water, Fruit H2O   Decaffeinated coffee or tea (sugar-free)   Crystal Lite, Wyler's Lite, Minute Maid Lite   Sugar-free Jell-O   Bouillon or broth   Sugar-free Popsicle:   *Less than 20 calories each; Limit 1 per day   Full Liquids:              Protein Shakes/Drinks + 2 choices per day of other full liquids shown below.    Other full liquids must be: No more than 12 grams of Carbs per serving,  No more than 3 grams of Fat per serving   Strained low-fat cream soup   Non-Fat milk   Fat-free Lactaid Milk   Sugar-free yogurt (Dannon Lite & Fit) Vitamins and Minerals (Start 1 day after surgery unless otherwise directed)   1 Chewable Multivitamin /  Multimineral Supplement (i.e. Centrum for Adults)   Chewable Calcium Citrate with Vitamin D-3. Take 1500 mg each day.           (Example: 3 Chewable Calcium Plus 600 with Vitamin D-3 can be found at [facility])           Do not mix multivitamins containing iron with calcium supplements; take 2 hours   apart   Do not substitute Tums (calcium carbonate) for your calcium   Menstruating women and those at risk for anemia may need extra iron. Talk with your doctor to see if you need additional iron.     If you need extra iron:  Total daily Iron recommendations (including Vitamins) = 50 - 100 mg Iron/day Do not stop taking or change any vitamins or minerals until you talk to your nutritionist or surgeon. Your nutritionist and / or physician must approve all vitamin and mineral supplements. Exercise For maximum success, begin exercising as soon as your doctor recommends. Make sure your physician approves any physical activity.   Depending on fitness level, begin with a simple walking program   Walk 5-15 minutes each day, 7 days per week.    Slowly increase until you are walking 30-45 minutes per day   Consider joining our BELT program. [phone] or email [email or web address] [email or web address] Things to remember:   You may have sexual relations when you feel  comfortable. It is VERY important for female patients to use a reliable birth control method. Fertility often increases after surgery. Do not get pregnant for at least 18 months.   It is very important to keep all follow up appointments with your surgeon, nutritionist, primary care physician, and behavioral health practitioner. After the first year, please follow up with your bariatric surgeon at least once a year in order to maintain best weight loss results.  [facility]: [phone] [facility]: [phone]   Free counseling is available for you and your family through collaboration between [facility] and [place]. Please call [phone] and leave a message.    Consider purchasing a medical alert bracelet that says you had lap-band surgery.    [facility] has a free Bariatric Surgery Support Group that meets monthly, the 3rd Thursday, 6 pm, Classroom #1, [place]. You may register online at [email or web address], but registration is not necessary. Select Classes and Support Groups, Bariatric Surgery, or Call [phone]   Do not return to work or drive until cleared by your surgeon   Use your CPAP when sleeping if applicable   Do not lift anything greater than ten pounds for at least two weeks.   You will probably have your first fill (fluid added to your band) 6 weeks after surgery  Talmadge Chad, RN Bariatric Nurse Coordinator

## 2012-04-10 NOTE — Discharge Summary (Signed)
Patient ID: Janet Blanchard [id number] 36 y.o. Mar 25, 1976  04/09/2012  Discharge date and time: 04/10/2012   Admitting Physician: Glenna Fellows T  Discharge Physician: Glenna Fellows T  Admission Diagnoses: morbid obesity   Discharge Diagnoses: same  Operations: Procedure(s): LAPAROSCOPIC GASTRIC BANDING  Admission Condition: good  Discharged Condition: good  Indication for Admission: patient is a 36 year old female with progressive morbid obesity unresponsive to medical management and comorbidities detailed elsewhere admitted at the time of elective lap band placement with hiatal hernia repair.  Hospital Course: on the morning of admission the patient underwent an uneventful lap band placement with repair of a small to moderate-sized hiatal hernia. The procedure was uncomplicated. Her postoperative course was smooth. She was observed overnight. There were no complications. Followup x-ray showed her band in good position. She was tolerating her liquid diet without difficulty. Vital signs are stable. Abdomen is benign and wounds clean and dry. She is ready for discharge after overnight observation.   Disposition: Home  Patient Instructions:   [name], [name]  Home Medication Instructions JYN:[id number]   Printed on:04/10/12 8657  Medication Information                    ALPRAZolam (XANAX) 0.5 MG tablet Take 0.5 mg by mouth at bedtime as needed. Anxiety and sleep           HYDROcodone-acetaminophen (NORCO) 5-325 MG per tablet Take 1 tablet by mouth every 8 (eight) hours as needed. pain           Cholecalciferol (VITAMIN D) 2000 UNITS CAPS Take 2 capsules by mouth daily.            meloxicam (MOBIC) 15 MG tablet Take 15 mg by mouth as needed.            Diphenhydramine-PE-APAP (BENADRYL ALLERGY/SINUS HEADACH) 12.5-5-325 MG TABS Take 1-2 tablets by mouth as needed. ALLERGIES AND HEADACHE           caffeine 200 MG TABS Take 200 mg by mouth as needed. energy      dicyclomine (BENTYL) 20 MG tablet Take 1 tablet (20 mg total) by mouth 2 (two) times daily as needed.           buPROPion (WELLBUTRIN) 100 MG tablet Take 200 mg by mouth daily.            loratadine (CLARITIN) 10 MG tablet Take 10 mg by mouth daily.           citalopram (CELEXA) 40 MG tablet Take 40 mg by mouth daily.           BIOTIN PO Take 1,000 mg by mouth every other day.            sucralfate (CARAFATE) 1 G tablet Take 1 g by mouth 2 (two) times daily.           ranitidine (ZANTAC) 150 MG tablet Take 150 mg by mouth 2 (two) times daily.           Omega-3 Fatty Acids (ULTRA OMEGA-3 FISH OIL PO) Take 1 capsule by mouth daily.           Calcium Carbonate-Vitamin D (CALCIUM + D) 600-200 MG-UNIT TABS Take 1 tablet by mouth 2 (two) times daily.           loperamide (IMODIUM A-D) 2 MG tablet Take 2 mg by mouth 4 (four) times daily as needed. DIARRHEA           meclizine (ANTIVERT) 25 MG  tablet Take 25 mg by mouth 3 (three) times daily as needed. DIZZINESS           chlorzoxazone (PARAFON) 500 MG tablet Take 250-500 mg by mouth 3 (three) times daily as needed. TAKES 1/2 -1 TABLET 3 TIMES DAILY           oxyCODONE-acetaminophen (ROXICET) 5-325 MG/5ML solution Take 5-10 mLs by mouth every 4 (four) hours as needed.             Activity: activity as tolerated Diet: protein shakes per bariatric instructions Wound Care: none needed  Follow-up:  With Dr. Johna Sheriff in 2 weeks.  Signed: [name] MD, FACS  04/10/2012, 8:29 AM

## 2012-04-12 ENCOUNTER — Telehealth (INDEPENDENT_AMBULATORY_CARE_PROVIDER_SITE_OTHER): Payer: Self-pay | Admitting: General Surgery

## 2012-04-12 NOTE — Telephone Encounter (Signed)
Pt calling with question about surgery 3 days ago for lap band.  She reports swelling at the site of the port: puffy, with slight redness and tenderness in the amount expected.  Recommended she apply ice to the site, on 15 min off 45 mins.  If not improved by Monday, call back.  Pt states she is taking her liquids without any problem and has moderate post op pain.

## 2012-04-13 ENCOUNTER — Telehealth (INDEPENDENT_AMBULATORY_CARE_PROVIDER_SITE_OTHER): Payer: Self-pay | Admitting: General Surgery

## 2012-04-13 NOTE — Telephone Encounter (Signed)
She had a lap band placed on 7/2 by Dr. Johna Sheriff.  She noticed some progressive swelling around the port site incision with no erythema, drainage, or ecchymosis.  She is tolerating the diet.  I told her that this was likely a seroma and the swelling wound eventually go down.  However, if the swelling continued to increase, I told her to call the office Monday to be seen.

## 2012-04-15 ENCOUNTER — Encounter (INDEPENDENT_AMBULATORY_CARE_PROVIDER_SITE_OTHER): Payer: Self-pay | Admitting: General Surgery

## 2012-04-15 ENCOUNTER — Ambulatory Visit (INDEPENDENT_AMBULATORY_CARE_PROVIDER_SITE_OTHER): Payer: Managed Care, Other (non HMO) | Admitting: General Surgery

## 2012-04-15 VITALS — BP 116/68 | HR 72 | Temp 97.4°F | Resp 14 | Ht 62.0 in | Wt 304.2 lb

## 2012-04-15 DIAGNOSIS — Z09 Encounter for follow-up examination after completed treatment for conditions other than malignant neoplasm: Secondary | ICD-10-CM

## 2012-04-15 NOTE — Progress Notes (Signed)
Subjective:     Patient ID: Janet Blanchard, female   DOB: June 02, 1976, 36 y.o.   MRN: [id number]  HPI 77 yof s/p lap band 2 July who complains of abdominal bloating. She states she has swelling around her port but this is actually farther down in pannus. She is having belching and is passing excessive amount of flatus. She has no n/v, is having bms. She is eating well without trouble.  She states the swelling in her abdomen is getting worse and that she has intermittent bloating. Her epigastric incision has opened.  She denies fevers.  Review of Systems     Objective:   Physical Exam Epigastric five port open superficially, none of her incisions is infected and port is easily palpable, she has expected tenderness and some edema but I think this is normal postop    Assessment:     S/p lap band    Plan:     I think this is all normal postop and she can follow up with Dr. Johna Sheriff in next 7-10 days.

## 2012-04-19 ENCOUNTER — Ambulatory Visit
Admission: RE | Admit: 2012-04-19 | Discharge: 2012-04-19 | Disposition: A | Discharge status: Home / Self Care | Payer: Managed Care, Other (non HMO) | Source: Ambulatory Visit | Attending: General Surgery | Admitting: General Surgery

## 2012-04-19 ENCOUNTER — Telehealth (INDEPENDENT_AMBULATORY_CARE_PROVIDER_SITE_OTHER): Payer: Self-pay | Admitting: General Surgery

## 2012-04-19 ENCOUNTER — Ambulatory Visit (INDEPENDENT_AMBULATORY_CARE_PROVIDER_SITE_OTHER): Payer: Managed Care, Other (non HMO) | Admitting: General Surgery

## 2012-04-19 ENCOUNTER — Encounter (INDEPENDENT_AMBULATORY_CARE_PROVIDER_SITE_OTHER): Payer: Self-pay | Admitting: General Surgery

## 2012-04-19 VITALS — BP 112/72 | Temp 97.0°F | Ht 62.0 in | Wt 300.0 lb

## 2012-04-19 DIAGNOSIS — Z09 Encounter for follow-up examination after completed treatment for conditions other than malignant neoplasm: Secondary | ICD-10-CM

## 2012-04-19 DIAGNOSIS — R188 Other ascites: Secondary | ICD-10-CM

## 2012-04-19 DIAGNOSIS — E669 Obesity, unspecified: Secondary | ICD-10-CM

## 2012-04-19 NOTE — Progress Notes (Signed)
History: The patient returns 10 days following lap band placement. The initial surgery was unremarkable. She however has noted some progressive swelling which to her feels like fluid in her midabdomen at her pannus. She feels this is gotten gradually worse. No redness or fever or chills. Her this feels like a collection of fluid in her abdominal wall. It is not painful. She is swallowing OK.  Exam: BP 112/72  Ht 5\' 2"  (1.575 m)  Wt 300 lb (136.079 kg)  BMI 54.87 kg/m2 Weight loss since surgery 21 pounds Gen. appears well Abdomen: Abdomen is generally soft and nontender. Incisions are healing well except for the epigastric port site which has some slight skin separation but is very clean. There does appear to be some soft tissue swelling at the pannus was a little bit of skin dimpling suggesting edema. There is no erythema or warmth or evidence of a discrete fluid collection.  Assessment and plan: Status post lap band placement with what appears to be some edema of her abdominal wall. I do not see evidence of infection. We will get an ultrasound of her abdominal wall to rule out a discrete fluid collection. I suggest she use a mild compression garments such as spandex or a binder. All: With the results of the ultrasound she will let me know if there is any worsening in short he has a scheduled appointment for one week.

## 2012-04-19 NOTE — Telephone Encounter (Signed)
Called pt and discussed [place] report

## 2012-04-23 ENCOUNTER — Encounter: Payer: Managed Care, Other (non HMO) | Attending: General Surgery | Admitting: *Deleted

## 2012-04-23 ENCOUNTER — Telehealth (INDEPENDENT_AMBULATORY_CARE_PROVIDER_SITE_OTHER): Payer: Self-pay | Admitting: General Surgery

## 2012-04-23 VITALS — Ht 62.0 in | Wt 297.6 lb

## 2012-04-23 DIAGNOSIS — Z713 Dietary counseling and surveillance: Secondary | ICD-10-CM | POA: Insufficient documentation

## 2012-04-23 DIAGNOSIS — E669 Obesity, unspecified: Secondary | ICD-10-CM

## 2012-04-23 DIAGNOSIS — Z01818 Encounter for other preprocedural examination: Secondary | ICD-10-CM | POA: Insufficient documentation

## 2012-04-23 NOTE — Telephone Encounter (Signed)
Will review patient message with Dr. Johna Sheriff and call patient.

## 2012-04-23 NOTE — Telephone Encounter (Signed)
The patient desires having an IUD placed she is s/p gastric banding on 04/09/12. She would like your approval to proceed with this.

## 2012-04-24 ENCOUNTER — Telehealth (INDEPENDENT_AMBULATORY_CARE_PROVIDER_SITE_OTHER): Payer: Self-pay

## 2012-04-24 NOTE — Telephone Encounter (Signed)
Okay to have Mirena IUD placed.

## 2012-04-24 NOTE — Telephone Encounter (Signed)
Left message for patient to return my call--per Dr. Johna Sheriff, [name] can have the Mirena IUD placed.  Patient has follow up appt on 04/26/12 @ 5:00 pm

## 2012-04-25 ENCOUNTER — Telehealth: Payer: Self-pay | Admitting: Obstetrics and Gynecology

## 2012-04-25 ENCOUNTER — Other Ambulatory Visit: Payer: Self-pay | Admitting: *Deleted

## 2012-04-25 ENCOUNTER — Encounter: Payer: Self-pay | Admitting: *Deleted

## 2012-04-25 DIAGNOSIS — Z3049 Encounter for surveillance of other contraceptives: Secondary | ICD-10-CM

## 2012-04-25 MED ORDER — LEVONORGESTREL 20 MCG/24HR IU IUD: INTRAUTERINE_SYSTEM | Freq: Once | INTRAUTERINE | Status: DC

## 2012-04-25 NOTE — Telephone Encounter (Signed)
Patient informed that Mirena IUD is a covered service with her insurance company and her plan pays 100% for the IUD and the office visit.  (Per Harriett Sine at [place].)  No precertification is required.  Patient does want to proceed.  I recommend patient call with menses to schedule for insertion.  However, she says she was on Depo-Provera and last injection was January. She had a light 3 day flow in April and nothing since.  She has been sexually active since April so I was not sure how to tell her to proceed. She did have lap-band surgery early July 2013.  She knows Dr. Reece Agar is out until July 31 and she is fine with me waiting to check with him and she knows it will be that week when I call her to let her know what Dr. Reece Agar recommends.

## 2012-04-25 NOTE — Progress Notes (Signed)
  Bariatric Class:  Appt start time: 1600 end time:  1700.  2 Week Post-Operative Nutrition Class  Patient was seen on 04/23/12 for Post-Operative Nutrition education at [facility].   Surgery date: 04/09/12 Surgery type: LAGB  The following the learning objective met the patient during this course:   Identifies Phase 3A (Soft, High Proteins) Dietary Goals and will begin from 2 weeks post-operatively to 2 months post-operatively   Identifies appropriate sources of fluids and proteins   States protein recommendations and appropriate sources post-operatively  Identifies the need for appropriate texture modifications, mastication, and bite sizes when consuming solids  Identifies appropriate multivitamin and calcium sources post-operatively  Describes the need for physical activity post-operatively and will follow MD recommendations  States when to call healthcare provider regarding medication questions or post-operative complications  Handouts given during class include:  Phase 3A: Soft, High Protein Diet Handout  Band Fill Guidelines Handout  Follow-Up Plan: Patient will follow-up at [facility] in 6 weeks for 2 months post-op nutrition visit for diet advancement per MD.

## 2012-04-25 NOTE — Patient Instructions (Signed)
Patient to follow Phase 3A-Soft, High Protein Diet and follow-up at [facility] in 6 weeks for 2 months post-op nutrition visit for diet advancement. 

## 2012-04-25 NOTE — Progress Notes (Signed)
Patient covered 100% for Mirena IUD.  Will call to set up with DG.

## 2012-04-26 ENCOUNTER — Encounter (INDEPENDENT_AMBULATORY_CARE_PROVIDER_SITE_OTHER): Payer: Self-pay | Admitting: General Surgery

## 2012-04-26 ENCOUNTER — Ambulatory Visit (INDEPENDENT_AMBULATORY_CARE_PROVIDER_SITE_OTHER): Payer: Managed Care, Other (non HMO) | Admitting: General Surgery

## 2012-04-26 VITALS — BP 118/74 | HR 70 | Temp 97.8°F | Resp 16 | Ht 62.0 in | Wt 295.5 lb

## 2012-04-26 DIAGNOSIS — E669 Obesity, unspecified: Secondary | ICD-10-CM

## 2012-04-26 NOTE — Progress Notes (Signed)
History: Patient returns for followup post lap band placement now 3 weeks postop. She is feeling better as far as her abdominal swelling. She is eating very slowly and has suppression of appetite but is able to eat probably more than a cup at a time. No dysphagia or vomiting. She is very happy with her weight loss.  Exam: BP 118/74  Pulse 70  Temp 97.8 F (36.6 C) (Temporal)  Resp 16  Ht 5\' 2"  (1.575 m)  Wt 295 lb 8 oz (134.038 kg)  BMI 54.05 kg/m2 Total weight loss 25 pounds, 4 pounds since last visit Abdomen: Soft and nontender. Incisions look fine.  After our discussion we elected to go ahead with an initial fill. Her port was accessed with a little difficulty and is tilted somewhat inferiorly toward her left foot. However we accessed and found 3 cc in and 1 cc to bring her to a total of 4 cc. We discussed diet exercise stretches. To return in 3 weeks.

## 2012-05-01 ENCOUNTER — Encounter: Payer: Self-pay | Admitting: *Deleted

## 2012-05-06 NOTE — Telephone Encounter (Signed)
When did patient last have intercourse?

## 2012-05-07 NOTE — Telephone Encounter (Signed)
Patient informed and scheduled for lab appt in the morning.

## 2012-05-07 NOTE — Telephone Encounter (Signed)
Have patient continue to abstain from intercourse and have a qualitative hCG done. If negative she can come in without a menstrual cycle and I can place the IUD.

## 2012-05-07 NOTE — Telephone Encounter (Signed)
Patient said last intercourse was the last week in June.

## 2012-05-08 ENCOUNTER — Other Ambulatory Visit: Payer: Managed Care, Other (non HMO)

## 2012-05-08 ENCOUNTER — Other Ambulatory Visit: Payer: Self-pay | Admitting: Gynecology

## 2012-05-08 DIAGNOSIS — N912 Amenorrhea, unspecified: Secondary | ICD-10-CM

## 2012-05-10 ENCOUNTER — Ambulatory Visit (INDEPENDENT_AMBULATORY_CARE_PROVIDER_SITE_OTHER): Payer: Managed Care, Other (non HMO) | Admitting: Obstetrics and Gynecology

## 2012-05-10 DIAGNOSIS — Z3049 Encounter for surveillance of other contraceptives: Secondary | ICD-10-CM

## 2012-05-10 NOTE — Progress Notes (Signed)
Patient came in today for a Mirena IUD insertion. She has abstained from intercourse for over 2 weeks and a negative qualitative hCG yesterday. We did this because she still hasn't started periods since she stopped her Depo-Provera. Pelvic exam was normal. We gave her a paracervical block with 20 cc 1% plain Xylocaine. Her cervical os was closed due to her previous LEEP. We dilated her #17 Pratt dilator. Her uterus sounded to 7 cm. A Mirena IUD was inserted with ease. Prior to the paracervical block we swabbed her cervix with Betadine. I've asked her to return in 6 weeks. She was instructed on how to feel the string.

## 2012-05-24 ENCOUNTER — Encounter (INDEPENDENT_AMBULATORY_CARE_PROVIDER_SITE_OTHER): Payer: Self-pay | Admitting: General Surgery

## 2012-05-24 ENCOUNTER — Ambulatory Visit (INDEPENDENT_AMBULATORY_CARE_PROVIDER_SITE_OTHER): Payer: Managed Care, Other (non HMO) | Admitting: General Surgery

## 2012-05-24 VITALS — BP 112/80 | HR 89 | Ht 62.0 in | Wt 293.8 lb

## 2012-05-24 DIAGNOSIS — E669 Obesity, unspecified: Secondary | ICD-10-CM

## 2012-05-24 NOTE — Progress Notes (Signed)
Chief complaint: Follow up LAP-BAND  History: Patient returns for followup of LAP-BAND placed 04/09/2012. We did her initial fill 4 weeks ago. She has noted some increase in restriction and occasionally fills up quickly but other times is able to eat more than a cup. She is not having any vomiting or regurgitation or reflux. She has been exercising regularly and feels she has lost some binges but has not noted much weight loss.  Exam: BP 112/80  Pulse 89  Ht 5\' 2"  (1.575 m)  Wt 293 lb 12.8 oz (133.267 kg)  BMI 53.74 kg/m2  SpO2 96% Total weight loss and 27 pounds General: Well-appearing Abdomen: Soft and nontender port site looks fine  Assessment and plan: This information we went ahead with a fill and added 1 cc to bring her up to 5 cc. She was able to tolerate water without difficulty. A review diet and exercise for allergies. Return in 4 weeks.

## 2012-05-24 NOTE — Patient Instructions (Signed)
Liquid diet only over the weekend then gradually begin solid foods on Monday with small bites eating very slow

## 2012-06-04 ENCOUNTER — Encounter: Payer: Self-pay | Admitting: *Deleted

## 2012-06-04 ENCOUNTER — Encounter: Payer: Managed Care, Other (non HMO) | Attending: General Surgery | Admitting: *Deleted

## 2012-06-04 VITALS — Ht 62.0 in | Wt 288.0 lb

## 2012-06-04 DIAGNOSIS — Z01818 Encounter for other preprocedural examination: Secondary | ICD-10-CM | POA: Insufficient documentation

## 2012-06-04 DIAGNOSIS — Z713 Dietary counseling and surveillance: Secondary | ICD-10-CM | POA: Insufficient documentation

## 2012-06-04 DIAGNOSIS — E669 Obesity, unspecified: Secondary | ICD-10-CM

## 2012-06-04 NOTE — Patient Instructions (Addendum)
Goals:  Follow Phase 3B: High Protein + Non-Starchy Vegetables  Eat 3-6 small meals/snacks, every 3-5 hrs  Increase lean protein foods to meet 60-80g goal  Increase fluid intake to 64oz +  Avoid drinking 15 minutes before, during and 30 minutes after eating  Aim for >30 min of physical activity daily  Try sublingual B12 for energy instead of caffeine tabs

## 2012-06-04 NOTE — Progress Notes (Addendum)
  Follow-up visit:  8 Weeks Post-Operative LAGB Surgery  Medical Nutrition Therapy:  Appt start time: 1500 end time:  1545.  Primary concerns today: Post-operative Bariatric Surgery Nutrition Management. Discussed replacing caffeine pills with B-12. Doing well, though fluid intake low (~35-45 oz/day).    Surgery date: 04/09/12 Surgery type: LAGB Start weight at [facility]: 317.2 lbs  Weight today: 288.0 lbs Weight change:  9.6 lbs Total weight lost: 29.2 lbs BMI: 52.7 kg/m^2  Weight goal: 199 lbs % Weight goal met: 25%  TANITA  BODY COMP RESULTS  06/04/12   %Fat 53.8%   Fat Mass (lbs) 155.0   Fat Free Mass (lbs) 133.0   Total Body Water (lbs) 97.5   24-hr recall: B (9 AM): 2 egg white omlette w/ bacon & sausage OR protein shake Snk (AM): None  L (12 PM): Crock pot chicken (2-3 oz) w/ salsa OR creamed soup, sauteed zuchinni  Snk (PM): String cheese  (8g) D (PM): Chicken (2-3 oz), green beans (canned better on stomach) OR protein shake if gym before Snk (PM): None  Fluid intake: Coffee (10-12 oz - regular) w/ splenda, water (24 oz) OR unsweetened tea (24 oz), protein shake (11 oz) = 35-45 oz Estimated total protein intake: 80-85 g  Medications: See medication list.  Supplementation: Taking as directed  Using straws: Not usually Drinking while eating: Sips with dry meats Hair loss: No Carbonated beverages: Ginger ale a few times (8 oz over min of 30 min time) N/V/D/C: Some constipation d/t surgery and low fluids - takes Benefiber daily Last Lap-Band fill: 05/24/12; up to 5 ccs  Recent physical activity:  2-3 days per week at gym; trying to be more consistent  Progress Towards Goal(s):  Some progress.  Handouts given during visit include:  Phase 3B: High Protein + Non-Starchy Vegetables   Nutritional Diagnosis:  Inadequate fluid intake As related to LAGB surgery.  As evidenced by patient consuming <65% of recommended fluid intake.    Intervention:  Nutrition  education.  Monitoring/Evaluation:  Dietary intake, exercise, lap band fills, and body weight. Follow up in 1 months for 3 month post-op visit.

## 2012-06-06 ENCOUNTER — Ambulatory Visit: Payer: Managed Care, Other (non HMO) | Admitting: *Deleted

## 2012-06-20 ENCOUNTER — Ambulatory Visit (INDEPENDENT_AMBULATORY_CARE_PROVIDER_SITE_OTHER): Payer: Managed Care, Other (non HMO) | Admitting: Obstetrics and Gynecology

## 2012-06-20 ENCOUNTER — Encounter: Payer: Self-pay | Admitting: Obstetrics and Gynecology

## 2012-06-20 DIAGNOSIS — N949 Unspecified condition associated with female genital organs and menstrual cycle: Secondary | ICD-10-CM

## 2012-06-20 DIAGNOSIS — N938 Other specified abnormal uterine and vaginal bleeding: Secondary | ICD-10-CM

## 2012-06-20 NOTE — Progress Notes (Signed)
Patient came back today to be sure her Mirena IUD was okay. She bled for approximately 3 days after insertion. Then last week she had one day of menstrual cramping and bleeding. She had her last Depo-Provera in January, 2013. She is doing well now without pain or bleeding. She is having no dyspareunia.  Exam: Kennon Portela present.Pelvic exam: External within normal limits. BUS within normal limits. Vaginal exam within normal limits. Cervix is clean without lesions. IUD string visible. Uterus is normal size and shape. Adnexa failed to reveal masses.   Assessment: Normal GYN exam.  Plan: Reassured about bleeding and cramping. Return when necessary. Return in May for yearly visit.

## 2012-06-21 ENCOUNTER — Encounter (INDEPENDENT_AMBULATORY_CARE_PROVIDER_SITE_OTHER): Payer: Self-pay | Admitting: General Surgery

## 2012-06-21 ENCOUNTER — Ambulatory Visit (INDEPENDENT_AMBULATORY_CARE_PROVIDER_SITE_OTHER): Payer: Managed Care, Other (non HMO) | Admitting: General Surgery

## 2012-06-21 VITALS — BP 132/70 | Ht 62.0 in | Wt 285.6 lb

## 2012-06-21 DIAGNOSIS — E669 Obesity, unspecified: Secondary | ICD-10-CM

## 2012-06-21 NOTE — Progress Notes (Signed)
Chief complaint: Followup lap band  History: Patient returns for followup of her lap band placed 03/05/2012. Her last film was 5 weeks ago. Since that time she has lost an additional 8 pounds for now total weight loss and 36 pounds. She is not having any symptoms of over restriction. However she is eating very slowly and feels full and she tries to eat too quickly. She is not really able proceed over a cup of solid high-protein food.  Exam: BP 132/70  Ht 5\' 2"  (1.575 m)  Wt 285 lb 9.6 oz (129.547 kg)  BMI 52.24 kg/m2 Total weight lost 35 pounds, 8 pounds since last Gen.: Obese well-appearing African female Abdomen: Incisions the port site looked fine  Assessment and plan: Will follow the lap band good progressive weight loss and adequate restriction at this time. We did not do an adjustment today and I will see her back in 6 weeks.

## 2012-06-26 ENCOUNTER — Telehealth (INDEPENDENT_AMBULATORY_CARE_PROVIDER_SITE_OTHER): Payer: Self-pay

## 2012-06-26 NOTE — Telephone Encounter (Signed)
Lap band recheck appointment given to patient for 08/02/12 @ 4:30 pm w/Dr. Johna Sheriff.

## 2012-07-01 ENCOUNTER — Ambulatory Visit: Payer: Managed Care, Other (non HMO) | Admitting: *Deleted

## 2012-07-25 ENCOUNTER — Telehealth (INDEPENDENT_AMBULATORY_CARE_PROVIDER_SITE_OTHER): Payer: Self-pay | Admitting: General Surgery

## 2012-07-25 NOTE — Telephone Encounter (Signed)
Pt called earlier today to report that she has noted a 7 lb weight gain since last Saturday. She has not changed her diet, eating her regular meals. No other symptoms reported/ I reviewed this with Dr. Johna Sheriff and he said this does happen occasionally without explanation. He said he could see her next week. She has an appointment on 08-02-12. She will call if weight gain continues to rise and be seen sooner/gy

## 2012-08-02 ENCOUNTER — Ambulatory Visit (INDEPENDENT_AMBULATORY_CARE_PROVIDER_SITE_OTHER): Payer: Managed Care, Other (non HMO) | Admitting: General Surgery

## 2012-08-02 ENCOUNTER — Encounter (INDEPENDENT_AMBULATORY_CARE_PROVIDER_SITE_OTHER): Payer: Self-pay | Admitting: General Surgery

## 2012-08-02 VITALS — BP 138/76 | HR 85 | Temp 98.7°F | Resp 18 | Ht 62.0 in | Wt 285.4 lb

## 2012-08-02 DIAGNOSIS — E669 Obesity, unspecified: Secondary | ICD-10-CM

## 2012-08-02 NOTE — Progress Notes (Signed)
Chief complaint: Followup lap band History: Patient returns for followup of her lap band placed in July 2013. At her last visit she had good weight loss and we did not do a fill. Since her last visit her weight has been unchanged. She still feels that she has pretty significant restriction she is having no symptoms of over restriction. She is exercising regularly.  Exam: BP 138/76  Pulse 85  Temp 98.7 F (37.1 C) (Oral)  Resp 18  Ht 5\' 2"  (1.575 m)  Wt 285 lb 6.4 oz (129.457 kg)  BMI 52.20 kg/m2 Total weight loss 36 pounds, no change from last visit  Assessment and plan: we decided to go ahead with a fill today and I had it one half cc to bring her up to 5.5 cc. We reviewed diet and exercise strategies.return in 6 weeks.

## 2012-08-02 NOTE — Patient Instructions (Addendum)
Liquid diet only over the weekend  Begin solid food Monday and concentrate on small bites and eating very slowly

## 2012-08-07 ENCOUNTER — Encounter: Payer: Managed Care, Other (non HMO) | Attending: General Surgery | Admitting: *Deleted

## 2012-08-07 VITALS — Ht 62.0 in | Wt 284.5 lb

## 2012-08-07 DIAGNOSIS — Z01818 Encounter for other preprocedural examination: Secondary | ICD-10-CM | POA: Insufficient documentation

## 2012-08-07 DIAGNOSIS — E669 Obesity, unspecified: Secondary | ICD-10-CM

## 2012-08-07 DIAGNOSIS — Z713 Dietary counseling and surveillance: Secondary | ICD-10-CM | POA: Insufficient documentation

## 2012-08-07 NOTE — Patient Instructions (Addendum)
Goals:  Follow Phase 3B: High Protein + Non-Starchy Vegetables  Increase lean protein foods to meet 60-80g goal  Increase fluid intake to 64oz +  Aim for >30 min of physical activity daily  Avoid fruit until next visit to facilitate wt loss  Limit/avoid peanut butter and crackers and soda  Make me a "friend" on My Fitness Pal - click for me to be able to view your diary  Return for Tanita scan (drop-in) in 1 month.

## 2012-08-07 NOTE — Progress Notes (Signed)
  Follow-up visit:  12 Weeks Post-Operative LAGB Surgery  Medical Nutrition Therapy:  Appt start time: 1500  End time:  1545.  Primary concerns today: Post-operative Bariatric Surgery Nutrition Management. Janet Blanchard returns for 3 mo f/u with 3.5 lb wt loss. Reports consuming excessive amounts of peanut butter and ritz crackers and is drinking 12 oz reg soda daily.  She states she has not had any over the last week however. Also tried to incorporate fruit into her diet, which slowed her weight loss. Discussed getting back on track and avoiding soda, fruit, peanut butter, and crackers for the next 30 days.  She will return for a repeat Tanita scan to see how this can help facilitate weight loss.  Decreased fluid intake noted.   Surgery date: 04/09/12 Surgery type: LAGB Start weight at [facility]: 317.2 lbs  Weight today: 284.5 lbs Weight change: 3.5 lbs Total weight lost: 32.7 lbs BMI: 52.0 kg/m^2  Weight goal: 199 lbs % Weight goal met: 31%  TANITA  BODY COMP RESULTS  06/04/12 08/07/12   %Fat 53.8% 53.2%   Fat Mass (lbs) 155.0 151.5   Fat Free Mass (lbs) 133.0 133.0   Total Body Water (lbs) 97.5 97.5   24-hr recall: B (9 AM): 2 egg white omlette w/ 2 regular sausage links from work ([place] sausage when at home)  Snk (AM): PB and Ritz crackers (Total of 10 w/ 5 tbsp PB - spread over the day) L (12 PM): Porkloin (3 oz), stuffing (3 T), mashed potatoes (2 T), turnip greens (1/2 c)  Snk (PM): 3/4 lg apple or rest of crackers (see above) D (PM): Chicken (2-3 oz), green beans OR protein shake if gym before dinner Snk (PM): None  Fluid intake: Coffee (10-12 oz - regular) w/ splenda, water (24 oz) OR unsweetened tea (24 oz), 12 oz Dr. Reino Kent = 35-45 oz Estimated total protein intake: 80-85 g  Medications: See medication list.  Supplementation: Taking as directed  Using straws: Not usually Drinking while eating: Sips with dry meats Hair loss: No Carbonated beverages: Yes - 12 oz reg Dr. Reino Kent  daily N/V/D/C:  Not having BM daily, but not constipated Last Lap-Band fill: 08/05/12 - 0.5 cc; feels on the border of green/yellow zone  Recent physical activity:  2-3 days per week at gym; 1 of these with personal trainer  Progress Towards Goal(s):  Some progress.   Nutritional Diagnosis:  NI-3.1 Inadequate fluid intake related to LAGB surgery as evidenced by patient consuming <65% of recommended fluid intake.    Intervention:  Nutrition education.  Monitoring/Evaluation:  Dietary intake, exercise, lap band fills, and body weight. Follow up in 3 months for 6 month post-op visit.

## 2012-08-08 ENCOUNTER — Encounter: Payer: Self-pay | Admitting: *Deleted

## 2012-09-12 ENCOUNTER — Ambulatory Visit (INDEPENDENT_AMBULATORY_CARE_PROVIDER_SITE_OTHER): Payer: Managed Care, Other (non HMO) | Admitting: Physician Assistant

## 2012-09-12 ENCOUNTER — Encounter (INDEPENDENT_AMBULATORY_CARE_PROVIDER_SITE_OTHER): Payer: Self-pay | Admitting: Physician Assistant

## 2012-09-12 ENCOUNTER — Encounter (INDEPENDENT_AMBULATORY_CARE_PROVIDER_SITE_OTHER): Payer: Managed Care, Other (non HMO)

## 2012-09-12 VITALS — BP 110/70 | HR 72 | Temp 97.2°F | Resp 18 | Ht 62.0 in | Wt 290.8 lb

## 2012-09-12 DIAGNOSIS — Z4651 Encounter for fitting and adjustment of gastric lap band: Secondary | ICD-10-CM

## 2012-09-12 NOTE — Progress Notes (Signed)
  HISTORY: Janet Blanchard is a 36 y.o.female who received an AP-Standard lap-band in July 2013 by Dr. Johna Sheriff. She comes in with increased weight, hunger and portion sizes. She's also been on prednisone in the past couple of weeks for a sinus infection. She's completed that medication. She denies regurgitation or reflux. She would like a fill today.  VITAL SIGNS: Filed Vitals:   09/12/12 1558  BP: 110/70  Pulse: 72  Temp: 97.2 F (36.2 C)  Resp: 18    PHYSICAL EXAM: Physical exam reveals a very well-appearing 36 y.o.female in no apparent distress Neurologic: Awake, alert, oriented Psych: Bright affect, conversant Respiratory: Breathing even and unlabored. No stridor or wheezing Abdomen: Soft, nontender, nondistended to palpation. Incisions well-healed. No incisional hernias. Port easily palpated. Extremities: Atraumatic, good range of motion.  ASSESMENT: 36 y.o.  female  s/p AP-Standard lap-band.   PLAN: The patient's port was accessed with a 20G Huber needle without difficulty. Clear fluid was aspirated and 0.5 mL saline was added to the port to give a total predicted volume of 6 mL. The patient was able to swallow water without difficulty following the procedure and was instructed to take clear liquids for the next 24-48 hours and advance slowly as tolerated.

## 2012-09-12 NOTE — Patient Instructions (Signed)
Take clear liquids tonight. Thin protein shakes are ok to start tomorrow morning. Slowly advance your diet thereafter. Call us if you have persistent vomiting or regurgitation, night cough or reflux symptoms. Return as scheduled or sooner if you notice no changes in hunger/portion sizes.  

## 2012-09-13 ENCOUNTER — Ambulatory Visit (INDEPENDENT_AMBULATORY_CARE_PROVIDER_SITE_OTHER): Payer: Managed Care, Other (non HMO) | Admitting: General Surgery

## 2012-09-13 ENCOUNTER — Encounter (INDEPENDENT_AMBULATORY_CARE_PROVIDER_SITE_OTHER): Payer: Self-pay | Admitting: General Surgery

## 2012-09-13 ENCOUNTER — Telehealth (INDEPENDENT_AMBULATORY_CARE_PROVIDER_SITE_OTHER): Payer: Self-pay | Admitting: General Surgery

## 2012-09-13 VITALS — BP 122/82 | HR 68 | Temp 97.9°F | Resp 16 | Ht 62.0 in | Wt 288.4 lb

## 2012-09-13 DIAGNOSIS — E669 Obesity, unspecified: Secondary | ICD-10-CM

## 2012-09-13 DIAGNOSIS — Z6841 Body Mass Index (BMI) 40.0 and over, adult: Secondary | ICD-10-CM

## 2012-09-13 NOTE — Progress Notes (Signed)
Chief complaint: Dysphagia post lap band fill  History: The patient had a fill yesterday by ANDY.Marland Kitchen Since then she states she has had some difficulty swallowing her medications. This seemed to hang up for a few minutes in her epigastrium but then go down okay. She is tolerating liquids without difficulty and having no regurgitation or vomiting.  Exam: BP 122/82  Pulse 68  Temp 97.9 F (36.6 C) (Temporal)  Resp 16  Ht 5\' 2"  (1.575 m)  Wt 288 lb 6.4 oz (130.817 kg)  BMI 52.75 kg/m2weight loss 33 pounds total, 2 pounds since yesterday General: Patient in no distress  Assessment and plan: She may be slightly over restricted but she's having no vomiting and tolerating liquids well. She clearly needed a fill yesterday. Therefore I recommended that we try to wait this out. She will look into breaking her pills a half. Stay on liquids over the weekend to start solid foods cautiously on Monday.

## 2012-09-13 NOTE — Patient Instructions (Signed)
May break pills and half for crush them if okay with pharmacist Stay on liquids over the weekend then start solid foods gradually on Monday

## 2012-09-13 NOTE — Telephone Encounter (Signed)
Pt called to report that she had a lap band fill by Mardelle Matte yesterday/ She thinks her band is too tight because she is unable to swallow her pills. I reviewed with Neysa Bonito and she said Dr. Johna Sheriff would work pt into office today. I made appt with Dr. Johna Sheriff for this pm/gy

## 2012-10-10 ENCOUNTER — Encounter (INDEPENDENT_AMBULATORY_CARE_PROVIDER_SITE_OTHER): Payer: Self-pay

## 2012-10-10 ENCOUNTER — Ambulatory Visit (INDEPENDENT_AMBULATORY_CARE_PROVIDER_SITE_OTHER): Payer: Managed Care, Other (non HMO) | Admitting: Physician Assistant

## 2012-10-10 VITALS — BP 120/76 | HR 84 | Temp 96.9°F | Resp 16 | Ht 62.0 in | Wt 285.4 lb

## 2012-10-10 DIAGNOSIS — Z4651 Encounter for fitting and adjustment of gastric lap band: Secondary | ICD-10-CM

## 2012-10-10 NOTE — Progress Notes (Signed)
  HISTORY: KELENA GARROW is a 37 y.o.female who received an AP-Standard lap-band in July 2013 by Dr. Johna Sheriff. She comes in with good restriction and good hunger control. She's pursuing exercise and this is reflected in reduced fat mass. She has some difficulty getting her calcium pills down but everything else goes down okay. She does not want an adjustment today.  VITAL SIGNS: Filed Vitals:   10/10/12 1558  BP: 120/76  Pulse: 84  Temp: 96.9 F (36.1 C)  Resp: 16    PHYSICAL EXAM: Physical exam reveals a very well-appearing 37 y.o.female in no apparent distress Neurologic: Awake, alert, oriented Psych: Bright affect, conversant Respiratory: Breathing even and unlabored. No stridor or wheezing Extremities: Atraumatic, good range of motion. Skin: Warm, Dry, no rashes Musculoskeletal: Normal gait, Joints normal  ASSESMENT: 37 y.o.  female  s/p AP-Standard lap-band.   PLAN: As she's in the green zone, we deferred an adjustment. We'll have her back in one month or sooner if needed.

## 2012-10-10 NOTE — Patient Instructions (Signed)
Return in one month. Focus on good food choices as well as physical activity. Return sooner if you have an increase in hunger, portion sizes or weight. Return also for difficulty swallowing, night cough, reflux.   

## 2012-11-07 ENCOUNTER — Encounter: Payer: Managed Care, Other (non HMO) | Attending: General Surgery | Admitting: *Deleted

## 2012-11-07 ENCOUNTER — Telehealth (INDEPENDENT_AMBULATORY_CARE_PROVIDER_SITE_OTHER): Payer: Self-pay | Admitting: General Surgery

## 2012-11-07 ENCOUNTER — Encounter: Payer: Self-pay | Admitting: *Deleted

## 2012-11-07 ENCOUNTER — Encounter (INDEPENDENT_AMBULATORY_CARE_PROVIDER_SITE_OTHER): Payer: Self-pay

## 2012-11-07 ENCOUNTER — Ambulatory Visit (INDEPENDENT_AMBULATORY_CARE_PROVIDER_SITE_OTHER): Payer: Managed Care, Other (non HMO) | Admitting: Physician Assistant

## 2012-11-07 VITALS — Ht 62.0 in | Wt 287.0 lb

## 2012-11-07 DIAGNOSIS — Z9884 Bariatric surgery status: Secondary | ICD-10-CM | POA: Insufficient documentation

## 2012-11-07 DIAGNOSIS — E669 Obesity, unspecified: Secondary | ICD-10-CM

## 2012-11-07 DIAGNOSIS — Z09 Encounter for follow-up examination after completed treatment for conditions other than malignant neoplasm: Secondary | ICD-10-CM | POA: Insufficient documentation

## 2012-11-07 NOTE — Progress Notes (Signed)
  HISTORY: Janet Blanchard is a 37 y.o.female who received an AP-Standard lap-band in July 2013 by Dr. Johna Sheriff. She comes in today with poor satiety but she complains of delay in food boluses passing the band. She describes the feeling as the food 'hanging up' in her esophagus and passing momentarily. She's able to eat pretty much anything she wants but is never satisfied with her meals. This started gradually after her last fill. No alleviating or exacerbating factors. She denies regurgitation or reflux however. Review of systems is negative except as listed above.  VITAL SIGNS: Filed Vitals:   11/07/12 1602  Pulse: 90  Temp: 95.7 F (35.4 C)  Resp: 18    PHYSICAL EXAM: Physical exam reveals a very well-appearing 37 y.o.female in no apparent distress Neurologic: Awake, alert, oriented Psych: Bright affect, conversant Respiratory: Breathing even and unlabored. No stridor or wheezing Extremities: Atraumatic, good range of motion. Skin: Warm, Dry, no rashes Abdomen: Soft, nontender, nondistended. No hernias. Port easily palpated. Musculoskeletal: Normal gait, Joints normal  ASSESMENT: 37 y.o.  female  s/p AP-Standard lap-band.   PLAN: I've ordered an upper GI to evaluate her pouch and band position. Normally one would see increased dysphagia and symptoms of over-restriction but this doesn't appear to be the case. We'll follow-up her results and have her return to the office afterward.

## 2012-11-07 NOTE — Progress Notes (Signed)
  Follow-up visit:  6 Months Post-Operative LAGB Surgery  Medical Nutrition Therapy:  Appt start time: 1500  End time:  1545.  Primary concerns today: Post-operative Bariatric Surgery Nutrition Management. Markelle returns for 6 mo f/u with 2.5 lb wt gain. Reports meal skipping daily and fluids are mostly caffeinated. Feels food is "hanging" in back of throat and can "feel liquids go all the way down"; Mardelle Matte has scheduled an upper GI to evaluate her pouch and band position - set for 11/14/12.   Surgery date: 04/09/12 Surgery type: LAGB Start weight at [facility]: 317.2 lbs  Weight today: 287.0 lbs Weight change: 2.5 lbs (GAIN) Total weight lost: 30.2 lbs BMI: 52.5 kg/m^2  Weight goal: 199 lbs % Weight goal met: 30%  TANITA  BODY COMP RESULTS  06/04/12 08/07/12 11/07/12   BMI (kg/m^2) 52.7 52.0 52.5   Fat Mass (lbs) 155.0 151.5 150.5   Fat Free Mass (lbs) 133.0 133.0 136.5   Total Body Water (lbs) 97.5 97.5 100.0   24-hr recall: B (9 AM): 2 egg white omlette w/ 3 pcs regular bacon (baked) from work OR 3/4 c Special K High Protein w/ skim milk (on weekends); coffee w/ 3 Tbsp Int'l Delight Caramel Macchiato creamer (15g CHO) Snk (AM): PB and Ritz crackers (Total of 10 w/ 5 tbsp PB - spread over the day) L (12 PM): Chicken fajita (1.5-2 oz) on salad OR 12 oz mushroom bisque, 2 oz sweet potato (once a week) Snk (PM): Protein bar (perfect zone) D (PM): protein (2 oz), green beans OR protein shake if gym before dinner Snk (PM): None  Fluid intake: Coffee (10-12 oz - regular) w/ splenda, water (24 oz) OR unsweetened tea (24 oz), 20 oz Dr. Reino Kent = 20-30 oz Estimated total protein intake: 80-85 g  Medications: See medication list.  Supplementation: Taking as directed  Using straws: Not usually Drinking while eating: Sips with dry meats Hair loss: No Carbonated beverages: Yes - 20 oz reg Dr. Reino Kent over several hours - 2-3 times/week N/V/D/C:  Not tolerating meats well now d/t "hanging" Last  Lap-Band fill: 09/12/12; Feels food is "hanging" in back of throat and can feel liquids go all the way down; Mardelle Matte has scheduled an upper GI to evaluate her pouch and band position - set for 11/14/12.   Recent physical activity:  2-3 days per week at gym; 1 of these with personal trainer  Progress Towards Goal(s):  Some progress.   Nutritional Diagnosis:  NI-3.1 Inadequate fluid intake related to LAGB surgery as evidenced by patient consuming <65% of recommended fluid intake.    Intervention:  Nutrition education.  Samples given during visit include:   Premier Protein Shake: 2 ea Lot # [id number]; Exp: 08/16/13  Monitoring/Evaluation:  Dietary intake, exercise, lap band fills, and body weight. Follow up in 1 months for 7 month post-op visit.

## 2012-11-07 NOTE — Patient Instructions (Addendum)
Goals:  Follow Phase 3B: High Protein + Non-Starchy Vegetables  Have a max of 15 grams of carbs per MEAL or SNACK  Increase lean protein foods to meet 60-80g goal  Increase non-caffeinated fluid intake to 64oz +  Aim for >30 min of physical activity daily  Avoid fruit until next visit to facilitate wt loss  Continue to limit/avoid peanut butter, crackers, and soda  Try PB2 as an alternate to peanut butter

## 2012-11-07 NOTE — Patient Instructions (Signed)
Attend your x-ray as scheduled. Follow-up with [place] as appointed after your study.

## 2012-11-07 NOTE — Telephone Encounter (Signed)
LMOM to call Janet Blanchard back...trying to see if she could come in sooner

## 2012-11-14 ENCOUNTER — Ambulatory Visit
Admission: RE | Admit: 2012-11-14 | Discharge: 2012-11-14 | Disposition: A | Discharge status: Home / Self Care | Payer: Managed Care, Other (non HMO) | Source: Ambulatory Visit | Attending: Physician Assistant | Admitting: Physician Assistant

## 2012-11-14 DIAGNOSIS — Z9884 Bariatric surgery status: Secondary | ICD-10-CM

## 2012-12-09 ENCOUNTER — Ambulatory Visit: Payer: Managed Care, Other (non HMO) | Admitting: *Deleted

## 2012-12-19 ENCOUNTER — Ambulatory Visit (INDEPENDENT_AMBULATORY_CARE_PROVIDER_SITE_OTHER): Payer: Managed Care, Other (non HMO) | Admitting: Physician Assistant

## 2012-12-19 ENCOUNTER — Encounter (INDEPENDENT_AMBULATORY_CARE_PROVIDER_SITE_OTHER): Payer: Self-pay

## 2012-12-19 VITALS — BP 132/90 | HR 80 | Temp 97.1°F | Resp 12 | Ht 62.0 in | Wt 288.6 lb

## 2012-12-19 NOTE — Patient Instructions (Signed)
Take clear liquids tonight. Thin protein shakes are ok to start tomorrow morning. Slowly advance your diet thereafter. Call us if you have persistent vomiting or regurgitation, night cough or reflux symptoms. Return as scheduled or sooner if you notice no changes in hunger/portion sizes.  

## 2012-12-19 NOTE — Progress Notes (Signed)
  HISTORY: Janet Blanchard is a 37 y.o.female who received an AP-Standard lap-band in July 2013 by Dr. Johna Sheriff. She comes in after having an upper GI which showed normal orientation and good passage of barium. She says she's had no further symptoms of slow transit but now she has increasing portion sizes and hunger. She denies regurgitation or reflux. She does have increased flatus for which she's taking simethicone on occasion.  VITAL SIGNS: Filed Vitals:   12/19/12 1526  BP: 132/90  Pulse: 80  Temp: 97.1 F (36.2 C)  Resp: 12    PHYSICAL EXAM: Physical exam reveals a very well-appearing 37 y.o.female in no apparent distress Neurologic: Awake, alert, oriented Psych: Bright affect, conversant Respiratory: Breathing even and unlabored. No stridor or wheezing Abdomen: Soft, nontender, nondistended to palpation. Incisions well-healed. No incisional hernias. Port easily palpated. Extremities: Atraumatic, good range of motion.  ASSESMENT: 37 y.o.  female  s/p AP-Standard lap-band.   PLAN: The patient's port was accessed with a 20G Huber needle without difficulty. Clear fluid was aspirated and 0.25 mL saline was added to the port to give a total predicted volume of 6.25 mL. The patient was able to swallow water without difficulty following the procedure and was instructed to take clear liquids for the next 24-48 hours and advance slowly as tolerated.

## 2013-01-16 ENCOUNTER — Encounter (INDEPENDENT_AMBULATORY_CARE_PROVIDER_SITE_OTHER): Payer: Self-pay

## 2013-01-16 ENCOUNTER — Telehealth (INDEPENDENT_AMBULATORY_CARE_PROVIDER_SITE_OTHER): Payer: Self-pay | Admitting: General Surgery

## 2013-01-16 ENCOUNTER — Ambulatory Visit (INDEPENDENT_AMBULATORY_CARE_PROVIDER_SITE_OTHER): Payer: Managed Care, Other (non HMO) | Admitting: Physician Assistant

## 2013-01-16 VITALS — BP 114/82 | HR 90 | Ht 62.0 in | Wt 284.8 lb

## 2013-01-16 DIAGNOSIS — Z4651 Encounter for fitting and adjustment of gastric lap band: Secondary | ICD-10-CM

## 2013-01-16 NOTE — Patient Instructions (Signed)
Take clear liquids tonight. Thin protein shakes are ok to start tomorrow morning. Slowly advance your diet thereafter. Call us if you have persistent vomiting or regurgitation, night cough or reflux symptoms. Return as scheduled or sooner if you notice no changes in hunger/portion sizes.  

## 2013-01-16 NOTE — Telephone Encounter (Signed)
LMOM asking patient to come to her appt today with [facility] just as soon as she could get here

## 2013-01-16 NOTE — Progress Notes (Signed)
  HISTORY: NEELA ZECCA is a 37 y.o.female who received an AP-Standard lap-band in July 2013 by Dr. Johna Sheriff. She comes in with 4 lbs weight loss since her last visit one month ago. She has had no regurgitation but she does experience some reflux symptoms after taking her morning medicines (which are pretty numerous). She tolerates solids well but is having hunger. Her portion sizes are larger than desired (e.g. Double fish filet sandwich for dinner). She wants a fill today.  VITAL SIGNS: Filed Vitals:   01/16/13 1618  BP: 114/82  Pulse: 90    PHYSICAL EXAM: Physical exam reveals a very well-appearing 37 y.o.female in no apparent distress Neurologic: Awake, alert, oriented Psych: Bright affect, conversant Respiratory: Breathing even and unlabored. No stridor or wheezing Abdomen: Soft, nontender, nondistended to palpation. Incisions well-healed. No incisional hernias. Port easily palpated. Extremities: Atraumatic, good range of motion.  ASSESMENT: 37 y.o.  female  s/p AP-Standard lap-band.   PLAN: The patient's port was accessed with a 20G Huber needle without difficulty. Clear fluid was aspirated and 0.25 mL saline was added to the port to give a total predicted volume of 6.5 mL. The patient was able to swallow water without difficulty following the procedure and was instructed to take clear liquids for the next 24-48 hours and advance slowly as tolerated. I suggested she try either spacing her meds out over the course of the day or to take them later in the morning/early afternoon. She will try this and report her results next month.

## 2013-01-27 ENCOUNTER — Other Ambulatory Visit: Payer: Self-pay | Admitting: Internal Medicine

## 2013-01-27 ENCOUNTER — Encounter (INDEPENDENT_AMBULATORY_CARE_PROVIDER_SITE_OTHER): Payer: Managed Care, Other (non HMO) | Admitting: General Surgery

## 2013-01-27 ENCOUNTER — Encounter (INDEPENDENT_AMBULATORY_CARE_PROVIDER_SITE_OTHER): Payer: Self-pay

## 2013-01-27 ENCOUNTER — Encounter (INDEPENDENT_AMBULATORY_CARE_PROVIDER_SITE_OTHER): Payer: Self-pay | Admitting: General Surgery

## 2013-01-29 ENCOUNTER — Encounter (INDEPENDENT_AMBULATORY_CARE_PROVIDER_SITE_OTHER): Payer: Self-pay | Admitting: Surgery

## 2013-01-29 ENCOUNTER — Ambulatory Visit (INDEPENDENT_AMBULATORY_CARE_PROVIDER_SITE_OTHER): Payer: Managed Care, Other (non HMO) | Admitting: Surgery

## 2013-01-29 VITALS — BP 134/72 | HR 89 | Temp 98.3°F | Resp 18 | Ht 62.0 in | Wt 286.2 lb

## 2013-01-29 DIAGNOSIS — Z9884 Bariatric surgery status: Secondary | ICD-10-CM

## 2013-01-29 NOTE — Progress Notes (Signed)
[facility]  Ovidio Kin, MD,  FACS [address] [place]  [address] [place], [place]    [address] Phone:  [phone] FAX:  [phone]   Re:   AILANA CUADRADO DOB:   1976-01-28 MRN:   [id number]  ASSESSMENT AND PLAN: 1.  History of lap band - Dr. Johna Sheriff - 04/09/2012  Initial weight - 321, BMI - 58.86  Hiatal repair at the same time.  Band too tight.  I removed 0.25 cc for an estimated total volume of 6.25 cc  She had improvement in her symptoms.  She is going to see Mardelle Matte in May, 2014.  2.  History of IBS (irritable bowel syndrome) .  3.  History of anxiety and depression   HISTORY OF PRESENT ILLNESS: Chief Complaint  Patient presents with  . Lap Band Fill    LBF   XZARIA TEO is a 37 y.o. (DOB: 1975-12-15)  AA  female who is a patient of GOLDING, Chancy Hurter, MD and comes to me today for Lap Band too tight.  She saw Mardelle Matte on 01/16/2013 who placed 0.25 cc for an estimated total on 6.5 cc.  She describes a "gurgling" sound when she eats solids or liquids and maybe some mild discomfort.  She is going to [facility] tomorrow for 4 days and does not want to get sick down there.  We discussed diet and exercise.  She admits that she is not getting the exercise that she is supposed to.  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  PHYSICAL EXAM: BP 134/72  Pulse 89  Temp(Src) 98.3 F (36.8 C) (Temporal)  Resp 18  Ht 5\' 2"  (1.575 m)  Wt 286 lb 3.2 oz (129.819 kg)  BMI 52.33 kg/m2  Abdomen:  Benign.  Port is tilted towards left hip.  Procedure:  While in the office I accessed Ms. Rossmann lap band and removed 0.25 cc for an estimated total of 6.25 cc.  She drank water and it went down better.  We talked about not going crazy in [facility].  DATA REVIEWED: Epic notes.   Ovidio Kin, MD, FACS Office:  [phone]

## 2013-02-13 ENCOUNTER — Encounter (INDEPENDENT_AMBULATORY_CARE_PROVIDER_SITE_OTHER): Payer: Managed Care, Other (non HMO)

## 2013-03-10 ENCOUNTER — Ambulatory Visit: Payer: Managed Care, Other (non HMO) | Admitting: *Deleted

## 2013-03-21 ENCOUNTER — Encounter: Payer: Self-pay | Admitting: Gynecology

## 2013-03-27 ENCOUNTER — Encounter (INDEPENDENT_AMBULATORY_CARE_PROVIDER_SITE_OTHER): Payer: Self-pay

## 2013-03-27 ENCOUNTER — Other Ambulatory Visit (HOSPITAL_COMMUNITY)
Admission: RE | Admit: 2013-03-27 | Discharge: 2013-03-27 | Disposition: A | Discharge status: Home / Self Care | Payer: Managed Care, Other (non HMO) | Source: Ambulatory Visit | Attending: Gynecology | Admitting: Gynecology

## 2013-03-27 ENCOUNTER — Ambulatory Visit (INDEPENDENT_AMBULATORY_CARE_PROVIDER_SITE_OTHER): Payer: Managed Care, Other (non HMO) | Admitting: Physician Assistant

## 2013-03-27 ENCOUNTER — Encounter: Payer: Self-pay | Admitting: Gynecology

## 2013-03-27 ENCOUNTER — Ambulatory Visit (INDEPENDENT_AMBULATORY_CARE_PROVIDER_SITE_OTHER): Payer: Managed Care, Other (non HMO) | Admitting: Gynecology

## 2013-03-27 VITALS — BP 130/74 | HR 76 | Temp 97.8°F | Resp 16 | Ht 62.0 in | Wt 280.8 lb

## 2013-03-27 VITALS — BP 126/82 | Ht 61.5 in | Wt 285.0 lb

## 2013-03-27 DIAGNOSIS — Z01419 Encounter for gynecological examination (general) (routine) without abnormal findings: Secondary | ICD-10-CM

## 2013-03-27 DIAGNOSIS — Z4651 Encounter for fitting and adjustment of gastric lap band: Secondary | ICD-10-CM

## 2013-03-27 DIAGNOSIS — Z1151 Encounter for screening for human papillomavirus (HPV): Secondary | ICD-10-CM | POA: Insufficient documentation

## 2013-03-27 DIAGNOSIS — Z9884 Bariatric surgery status: Secondary | ICD-10-CM

## 2013-03-27 DIAGNOSIS — Z113 Encounter for screening for infections with a predominantly sexual mode of transmission: Secondary | ICD-10-CM

## 2013-03-27 DIAGNOSIS — A499 Bacterial infection, unspecified: Secondary | ICD-10-CM

## 2013-03-27 DIAGNOSIS — N760 Acute vaginitis: Secondary | ICD-10-CM

## 2013-03-27 LAB — CBC WITH DIFFERENTIAL/PLATELET
Basophils Absolute: 0 10*3/uL (ref 0.0–0.1)
Eosinophils Relative: 5 % (ref 0–5)
HCT: 38.2 % (ref 36.0–46.0)
Lymphocytes Relative: 37 % (ref 12–46)
Lymphs Abs: 3.5 10*3/uL (ref 0.7–4.0)
MCV: 84 fL (ref 78.0–100.0)
Monocytes Absolute: 0.6 10*3/uL (ref 0.1–1.0)
RDW: 13.6 % (ref 11.5–15.5)
WBC: 9.3 10*3/uL (ref 4.0–10.5)

## 2013-03-27 LAB — WET PREP FOR TRICH, YEAST, CLUE: Yeast Wet Prep HPF POC: NONE SEEN

## 2013-03-27 MED ORDER — METRONIDAZOLE 500 MG PO TABS: 500.0000 mg | ORAL_TABLET | Freq: Two times a day (BID) | ORAL | Status: DC

## 2013-03-27 NOTE — Progress Notes (Signed)
Janet Blanchard [birth year] [id number]   History:    37 y.o.  for annual gyn exam with no complaints but wanted to have an STD screen today. 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  Patient had a history of decreased bone mineralization in 2007 based on a bone density study as a result of her long-term use of Depo-Provera. Patient now has a Mirena IUD that was placed last year and she has done well.  Patient had lap banding in 2013. In 2012 patient had a mammogram as a result of some breast thickening and the result was negative.  Past medical history,surgical history, family history and social history were all reviewed and documented in the EPIC chart.  Gynecologic History Patient's last menstrual period was 03/09/2013. Contraception: IUD Last Pap: 2013. Results were: normal Last mammogram: 2012. Results were: normal  Obstetric History OB History   Grav Para Term Preterm Abortions TAB SAB Ect Mult Living   0                ROS: A ROS was performed and pertinent positives and negatives are included in the history.  GENERAL: No fevers or chills. HEENT: No change in vision, no earache, sore throat or sinus congestion. NECK: No pain or stiffness. CARDIOVASCULAR: No chest pain or pressure. No palpitations. PULMONARY: No shortness of breath, cough or wheeze. GASTROINTESTINAL: No abdominal pain, nausea, vomiting or diarrhea, melena or bright red blood per rectum. GENITOURINARY: No urinary frequency, urgency, hesitancy or dysuria. MUSCULOSKELETAL: No joint or muscle pain, no back pain, no recent trauma. DERMATOLOGIC: No rash, no itching, no lesions. ENDOCRINE: No  polyuria, polydipsia, no heat or cold intolerance. No recent change in weight. HEMATOLOGICAL: No anemia or easy bruising or bleeding. NEUROLOGIC: No headache, seizures, numbness, tingling or weakness. PSYCHIATRIC: No depression, no loss of interest in normal activity or change in sleep pattern.     Exam: chaperone present  BP 126/82  Ht 5' 1.5" (1.562 m)  Wt 285 lb (129.275 kg)  BMI 52.98 kg/m2  LMP 03/09/2013  Body mass index is 52.98 kg/(m^2).  General appearance : Well developed well nourished female. No acute distress HEENT: Neck supple, trachea midline, no carotid bruits, no thyroidmegaly Lungs: Clear to auscultation, no rhonchi or wheezes, or rib retractions  Heart: Regular rate and rhythm, no murmurs or gallops Breast:Examined in sitting and supine position were symmetrical in appearance, no palpable masses or tenderness,  no skin retraction, no nipple inversion, no nipple discharge, no skin discoloration, no axillary or supraclavicular lymphadenopathy Abdomen: no palpable masses or tenderness, no rebound or guarding Extremities: no edema or skin discoloration or tenderness  Pelvic:  Bartholin, Urethra, Skene Glands: Within normal limits             Vagina: No gross lesions or discharge, IUD string seen  Cervix: No gross lesions or discharge  Uterus  anteverted, normal size, shape and consistency, non-tender and mobile  Adnexa  Without masses or tenderness  Anus and perineum  normal   Rectovaginal  normal sphincter tone without palpated masses or tenderness             Hemoccult not indicated   GC and Chlamydia culture obtained  results pending at time of this dictation Wet prep:Pos Amine, TNTC clue cells, TNTC bacteria  The patient voiced after completion the exam that she has increased libido but denied any hirsutism or any oily skin.  Assessment/Plan:  37 y.o. female for annual exam With clinical evidence of bacterial vaginosis. Patient will be placed on Flagyl 500 mg  one by mouth twice a day for 5 days. To complete her STD screening beside the GC and chlamydia culture the following will be ordered: RPR, HIV, hepatitis B and C. Will be ordered as well. The following labs were also ordered: Comprehensive metabolic panel, TSH, CBC, urinalysis, vitamin D level and Pap smear. She was reminded to do her monthly self breast examination.    Ok Edwards MD, 2:35 PM 03/27/2013

## 2013-03-27 NOTE — Patient Instructions (Signed)
Take clear liquids tonight. Thin protein shakes are ok to start tomorrow morning. Slowly advance your diet thereafter. Call us if you have persistent vomiting or regurgitation, night cough or reflux symptoms. Return as scheduled or sooner if you notice no changes in hunger/portion sizes.  

## 2013-03-27 NOTE — Progress Notes (Signed)
  HISTORY: Janet Blanchard is a 37 y.o.female who received an AP-Standard lap-band in July 2013 by Janet Blanchard. She comes in with 5 lbs weight loss since last seen by Dr. Ezzard Standing for removal of .25 mL fluid. She did well with no weight gain on vacation. The gurgling sound while eating has resolved but she feels like she's eating too much. She wants a fill today. We discussed how much fluid to put in as I don't want her to be over-restricted.  VITAL SIGNS: Filed Vitals:   03/27/13 1536  BP: 130/74  Pulse: 76  Temp: 97.8 F (36.6 C)  Resp: 16    PHYSICAL EXAM: Physical exam reveals a very well-appearing 37 y.o.female in no apparent distress Neurologic: Awake, alert, oriented Psych: Bright affect, conversant Respiratory: Breathing even and unlabored. No stridor or wheezing Abdomen: Soft, nontender, nondistended to palpation. Incisions well-healed. No incisional hernias. Port easily palpated. Extremities: Atraumatic, good range of motion.  ASSESMENT: 37 y.o.  female  s/p AP-Standard lap-band.   PLAN: The patient's port was accessed with a 20G Huber needle without difficulty. Clear fluid was aspirated and 0.2 mL saline was added to the port to give a total predicted volume of 6.45 mL. The patient was able to swallow water without difficulty following the procedure and was instructed to take clear liquids for the next 24-48 hours and advance slowly as tolerated.

## 2013-03-27 NOTE — Patient Instructions (Addendum)
Breast Self-Awareness Practicing breast self-awareness may pick up problems early, prevent significant medical complications, and possibly save your life. By practicing breast self-awareness, you can become familiar with how your breasts look and feel and if your breasts are changing. This allows you to notice changes early. It can also offer you some reassurance that your breast health is good. One way to learn what is normal for your breasts and whether your breasts are changing is to do a breast self-exam. If you find a lump or something that was not present in the past, it is best to contact your caregiver right away. Other findings that should be evaluated by your caregiver include nipple discharge, especially if it is bloody; skin changes or reddening; areas where the skin seems to be pulled in (retracted); or new lumps and bumps. Breast pain is seldom associated with cancer (malignancy), but should also be evaluated by a caregiver. HOW TO PERFORM A BREAST SELF-EXAM The best time to examine your breasts is 5 7 days after your menstrual period is over. During menstruation, the breasts are lumpier, and it may be more difficult to pick up changes. If you do not menstruate, have reached menopause, or had your uterus removed (hysterectomy), you should examine your breasts at regular intervals, such as monthly. If you are breastfeeding, examine your breasts after a feeding or after using a breast pump. Breast implants do not decrease the risk for lumps or tumors, so continue to perform breast self-exams as recommended. Talk to your caregiver about how to determine the difference between the implant and breast tissue. Also, talk about the amount of pressure you should use during the exam. Over time, you will become more familiar with the variations of your breasts and more comfortable with the exam. A breast self-exam requires you to remove all your clothes above the waist. 1. Look at your breasts and nipples.  Stand in front of a mirror in a room with good lighting. With your hands on your hips, push your hands firmly downward. Look for a difference in shape, contour, and size from one breast to the other (asymmetry). Asymmetry includes puckers, dips, or bumps. Also, look for skin changes, such as reddened or scaly areas on the breasts. Look for nipple changes, such as discharge, dimpling, repositioning, or redness. 2. Carefully feel your breasts. This is best done either in the shower or tub while using soapy water or when flat on your back. Place the arm (on the side of the breast you are examining) above your head. Use the pads (not the fingertips) of your three middle fingers on your opposite hand to feel your breasts. Start in the underarm area and use  inch (2 cm) overlapping circles to feel your breast. Use 3 different levels of pressure (light, medium, and firm pressure) at each circle before moving to the next circle. The light pressure is needed to feel the tissue closest to the skin. The medium pressure will help to feel breast tissue a little deeper, while the firm pressure is needed to feel the tissue close to the ribs. Continue the overlapping circles, moving downward over the breast until you feel your ribs below your breast. Then, move one finger-width towards the center of the body. Continue to use the  inch (2 cm) overlapping circles to feel your breast as you move slowly up toward the collar bone (clavicle) near the base of the neck. Continue the up and down exam using all 3 pressures until you reach   the middle of the chest. Do this with each breast, carefully feeling for lumps or changes. 3.  Keep a written record with breast changes or normal findings for each breast. By writing this information down, you do not need to depend only on memory for size, tenderness, or location. Write down where you are in your menstrual cycle, if you are still menstruating. Breast tissue can have some lumps or  thick tissue. However, see your caregiver if you find anything that concerns you.  SEEK MEDICAL CARE IF:  You see a change in shape, contour, or size of your breasts or nipples.   You see skin changes, such as reddened or scaly areas on the breasts or nipples.   You have an unusual discharge from your nipples.   You feel a new lump or unusually thick areas.  Document Released: 09/25/2005 Document Revised: 09/11/2012 Document Reviewed: 01/10/2012 [facility] Patient Information 2014 [place], [place]. Bacterial Vaginosis Bacterial vaginosis (BV) is a vaginal infection where the normal balance of bacteria in the vagina is disrupted. The normal balance is then replaced by an overgrowth of certain bacteria. There are several different kinds of bacteria that can cause BV. BV is the most common vaginal infection in women of childbearing age. CAUSES   The cause of BV is not fully understood. BV develops when there is an increase or imbalance of harmful bacteria.  Some activities or behaviors can upset the normal balance of bacteria in the vagina and put women at increased risk including:  Having a new sex partner or multiple sex partners.  Douching.  Using an intrauterine device (IUD) for contraception.  It is not clear what role sexual activity plays in the development of BV. However, women that have never had sexual intercourse are rarely infected with BV. Women do not get BV from toilet seats, bedding, swimming pools or from touching objects around them.  SYMPTOMS   Grey vaginal discharge.  A fish-like odor with discharge, especially after sexual intercourse.  Itching or burning of the vagina and vulva.  Burning or pain with urination.  Some women have no signs or symptoms at all. DIAGNOSIS  Your caregiver must examine the vagina for signs of BV. Your caregiver will perform lab tests and look at the sample of vaginal fluid through a microscope. They will look for bacteria and  abnormal cells (clue cells), a pH test higher than 4.5, and a positive amine test all associated with BV.  RISKS AND COMPLICATIONS   Pelvic inflammatory disease (PID).  Infections following gynecology surgery.  Developing HIV.  Developing herpes virus. TREATMENT  Sometimes BV will clear up without treatment. However, all women with symptoms of BV should be treated to avoid complications, especially if gynecology surgery is planned. Female partners generally do not need to be treated. However, BV may spread between female sex partners so treatment is helpful in preventing a recurrence of BV.   BV may be treated with antibiotics. The antibiotics come in either pill or vaginal cream forms. Either can be used with nonpregnant or pregnant women, but the recommended dosages differ. These antibiotics are not harmful to the baby.  BV can recur after treatment. If this happens, a second round of antibiotics will often be prescribed.  Treatment is important for pregnant women. If not treated, BV can cause a premature delivery, especially for a pregnant woman who had a premature birth in the past. All pregnant women who have symptoms of BV should be checked and treated.  For  chronic reoccurrence of BV, treatment with a type of prescribed gel vaginally twice a week is helpful. HOME CARE INSTRUCTIONS   Finish all medication as directed by your caregiver.  Do not have sex until treatment is completed.  Tell your sexual partner that you have a vaginal infection. They should see their caregiver and be treated if they have problems, such as a mild rash or itching.  Practice safe sex. Use condoms. Only have 1 sex partner. PREVENTION  Basic prevention steps can help reduce the risk of upsetting the natural balance of bacteria in the vagina and developing BV:  Do not have sexual intercourse (be abstinent).  Do not douche.  Use all of the medicine prescribed for treatment of BV, even if the signs and  symptoms go away.  Tell your sex partner if you have BV. That way, they can be treated, if needed, to prevent reoccurrence. SEEK MEDICAL CARE IF:   Your symptoms are not improving after 3 days of treatment.  You have increased discharge, pain, or fever. MAKE SURE YOU:   Understand these instructions.  Will watch your condition.  Will get help right away if you are not doing well or get worse. FOR MORE INFORMATION  Division of STD Prevention (DSTDP), Centers for Disease Control and Prevention: [employer] American Social Health Association (ASHA): www.ashastd.org  Document Released: 09/25/2005 Document Revised: 12/18/2011 Document Reviewed: 03/18/2009 [facility] Patient Information 2014 [place], [place].

## 2013-03-27 NOTE — Addendum Note (Signed)
Addended by: Bertram Savin A on: 03/27/2013 02:49 PM   Modules accepted: Orders

## 2013-03-28 LAB — VITAMIN D 25 HYDROXY (VIT D DEFICIENCY, FRACTURES): Vit D, 25-Hydroxy: 78 ng/mL (ref 30–89)

## 2013-03-28 LAB — URINALYSIS W MICROSCOPIC + REFLEX CULTURE
Bilirubin Urine: NEGATIVE
Casts: NONE SEEN
Glucose, UA: NEGATIVE mg/dL
Hgb urine dipstick: NEGATIVE
Protein, ur: NEGATIVE mg/dL

## 2013-03-28 LAB — COMPREHENSIVE METABOLIC PANEL
Albumin: 4.3 g/dL (ref 3.5–5.2)
BUN: 10 mg/dL (ref 6–23)
CO2: 25 mEq/L (ref 19–32)
Calcium: 9.3 mg/dL (ref 8.4–10.5)
Chloride: 106 mEq/L (ref 96–112)
Glucose, Bld: 76 mg/dL (ref 70–99)
Potassium: 4.2 mEq/L (ref 3.5–5.3)

## 2013-03-28 LAB — HEPATITIS B SURFACE ANTIGEN: Hepatitis B Surface Ag: NEGATIVE

## 2013-03-28 LAB — HEPATITIS C ANTIBODY: HCV Ab: NEGATIVE

## 2013-03-28 LAB — TESTOSTERONE: Testosterone: 34 ng/dL (ref 10–70)

## 2013-03-29 LAB — URINE CULTURE: Colony Count: >=100000

## 2013-03-31 ENCOUNTER — Other Ambulatory Visit: Payer: Self-pay | Admitting: Gynecology

## 2013-03-31 DIAGNOSIS — R8271 Bacteriuria: Secondary | ICD-10-CM

## 2013-04-24 ENCOUNTER — Encounter (INDEPENDENT_AMBULATORY_CARE_PROVIDER_SITE_OTHER): Payer: Managed Care, Other (non HMO)

## 2013-04-24 ENCOUNTER — Ambulatory Visit (INDEPENDENT_AMBULATORY_CARE_PROVIDER_SITE_OTHER): Payer: Managed Care, Other (non HMO) | Admitting: Physician Assistant

## 2013-04-24 ENCOUNTER — Encounter (INDEPENDENT_AMBULATORY_CARE_PROVIDER_SITE_OTHER): Payer: Self-pay

## 2013-04-24 DIAGNOSIS — Z9884 Bariatric surgery status: Secondary | ICD-10-CM

## 2013-04-24 NOTE — Patient Instructions (Signed)
Return in three months. Focus on good food choices as well as physical activity. Return sooner if you have an increase in hunger, portion sizes or weight. Return also for difficulty swallowing, night cough, reflux.   

## 2013-04-24 NOTE — Progress Notes (Signed)
  HISTORY: ZHOEY BLACKSTOCK is a 37 y.o.female who received an AP-Standard lap-band in July 2013 by Dr. Johna Sheriff. She comes in with 3.5 lbs of weight loss in the past month. She describes good portion control and good satiety. She denies regurgitation or reflux symptoms. She had an episode of left lateral abdominal pain on 7/3 which was associated with diarrhea and chills. It self-resolved without complaint of vomiting.  VITAL SIGNS: Filed Vitals:   04/24/13 1133  BP: 118/70  Pulse: 76  Resp: 14    PHYSICAL EXAM: Physical exam reveals a very well-appearing 37 y.o.female in no apparent distress Neurologic: Awake, alert, oriented Psych: Bright affect, conversant Respiratory: Breathing even and unlabored. No stridor or wheezing Extremities: Atraumatic, good range of motion. Skin: Warm, Dry, no rashes Musculoskeletal: Normal gait, Joints normal  ASSESMENT: 37 y.o.  female  s/p AP-Standard lap-band.   PLAN: We discussed being in the green zone. I'm concerned that a fill would cause obstruction, which she agreed with. We'll not adjust the band today and have her back in three months unless she needs to be seen earlier.

## 2013-05-26 ENCOUNTER — Encounter: Payer: Self-pay | Admitting: *Deleted

## 2013-05-26 ENCOUNTER — Encounter: Payer: Managed Care, Other (non HMO) | Attending: General Surgery | Admitting: *Deleted

## 2013-05-26 VITALS — Ht 62.0 in | Wt 277.5 lb

## 2013-05-26 DIAGNOSIS — E669 Obesity, unspecified: Secondary | ICD-10-CM | POA: Insufficient documentation

## 2013-05-26 DIAGNOSIS — Z713 Dietary counseling and surveillance: Secondary | ICD-10-CM | POA: Insufficient documentation

## 2013-05-26 NOTE — Progress Notes (Signed)
  Follow-up visit:  6 Months Post-Operative LAGB Surgery  Medical Nutrition Therapy:  Appt start time: 1500  End time:  1535.  Primary concerns today: Post-operative Bariatric Surgery Nutrition Management. Janet Blanchard returns for 13 mo f/u with 9.5 lb wt loss since last visit. Does not want to eat chicken because it "hangs" in the back of her throat and not palatable. Beef is easier to tolerate.  Reports 2 rounds of abx since last visit for rash/infection in pannus.  No exercise at this time, but plans to resume when cooler weather. Having episodes of hypoglycemia ~1 time/week. Feels better when treats with sugar/food than glucose tabs. Advised pt to contact PCP or surgeon asap if hypoglycemia continues.  Surgery date: 04/09/12 Surgery type: LAGB Start weight at [facility]: 317.2 lbs  Last weight: 287.0 lbs Weight today: 277.5 lbs Weight change: 9.5 lbs (LOSS) Total weight lost: 39.7 lbs  Weight goal: 199 lbs % Weight goal met: 34%  TANITA  BODY COMP RESULTS  06/04/12 08/07/12 11/07/12 05/26/13   BMI (kg/m^2) 52.7 52.0 52.5 50.8   Fat Mass (lbs) 155.0 151.5 150.5 146.0   Fat Free Mass (lbs) 133.0 133.0 136.5 131.5   Total Body Water (lbs) 97.5 97.5 100.0 96.5   24-hr recall: B (9 AM): 1 egg white omlette w/ 2 pcs regular (baked) bacon (20g) @ work OR Protein Shake (30-35g) Snk (AM): NONE L (12 PM): --- Not Reported --- Snk (PM): Fruit  D (PM): Lean protein (2 oz), vegetables (15g) Snk (PM): None  Fluid intake: Coffee (10-12 oz - regular) w/ splenda, water (24 oz) OR unsweetened tea (24 oz), 20 oz Dr. Reino Kent = 25-30 oz Estimated total protein intake: 50-60 g  Medications: See medication list.  Supplementation: Not taking at this time   Using straws: Not usually Drinking while eating: Baby sips with dry meats Hair loss: No Carbonated beverages: Yes - d/c'd Dr. Alcus Dad d/t recent nausea; has 8-12 oz of ginger ale daily over several hours N/V/D/C:  Nausea daily for 1 week (~ 2 weeks ago).  Treated with OTC Last Lap-Band fill: 03/27/13 - 0.2 added. Feels in the green. Previous GI WNL.   Recent physical activity:  None; "I've fallen off the wagon" d/t heat  Progress Towards Goal(s):  Some progress.   Nutritional Diagnosis:  NI-3.1 Inadequate fluid intake related to LAGB surgery as evidenced by patient consuming <65% of recommended fluid intake.    Intervention:  Nutrition education.  Samples given during visit include:   VerioIQ Starter Meter Kit: 1 kit Lot: [id number]; Exp: 05/15   VerioIQ Strips: 2 boxes @ 10 strips/box Lot: [id number]; Exp: 01/16   Premier Protein Shake: 2 bottles Lot: [id number]; Exp: 10/21/13   Unjury Protein Powder -- 2 ea of the following Lot: [id number]; Exp: 09/15 Lot: [id number]; Exp: 09/15 Lot: [id number]; Exp: 09/15   Celebrate Vitamins Calcium Citrate -- 6 ea of the following:  Lot: [id number]; Exp: 01/16 Lot: [id number]; Exp: 01/16  Monitoring/Evaluation:  Dietary intake, exercise, lap band fills, and body weight. Follow up in 6 weeks for post-op visit.

## 2013-05-26 NOTE — Patient Instructions (Addendum)
Goals:  Follow Phase 3B: High Protein + Non-Starchy Vegetables  Have a max of 15 grams of carbs per MEAL or SNACK  Add protein serving to all carbs (meal or snack)  Increase lean protein foods to meet 60-80g goal  Increase non-caffeinated fluid intake to 64oz +  Aim for >30 min of physical activity daily  Continue to limit/avoid peanut butter, crackers, and soda  Try PB2 as an alternate to peanut butter   **Contact your PCP or surgeon asap if hypoglycemia continues.

## 2013-07-07 ENCOUNTER — Telehealth: Payer: Self-pay | Admitting: Internal Medicine

## 2013-07-07 ENCOUNTER — Ambulatory Visit: Payer: Managed Care, Other (non HMO) | Admitting: *Deleted

## 2013-07-07 NOTE — Telephone Encounter (Signed)
Patient with LUQ pain requesting to be seen earlier.  She is scheduled for an appt with Willette Cluster RNP tomorrow am at 10:00

## 2013-07-08 ENCOUNTER — Ambulatory Visit (INDEPENDENT_AMBULATORY_CARE_PROVIDER_SITE_OTHER)
Admission: RE | Admit: 2013-07-08 | Discharge: 2013-07-08 | Disposition: A | Discharge status: Home / Self Care | Payer: Managed Care, Other (non HMO) | Source: Ambulatory Visit | Attending: Nurse Practitioner | Admitting: Nurse Practitioner

## 2013-07-08 ENCOUNTER — Other Ambulatory Visit (INDEPENDENT_AMBULATORY_CARE_PROVIDER_SITE_OTHER): Payer: Managed Care, Other (non HMO)

## 2013-07-08 ENCOUNTER — Encounter: Payer: Self-pay | Admitting: Nurse Practitioner

## 2013-07-08 ENCOUNTER — Ambulatory Visit (INDEPENDENT_AMBULATORY_CARE_PROVIDER_SITE_OTHER): Payer: Managed Care, Other (non HMO) | Admitting: Nurse Practitioner

## 2013-07-08 VITALS — BP 136/88 | HR 83 | Ht 61.75 in | Wt 275.0 lb

## 2013-07-08 DIAGNOSIS — R1012 Left upper quadrant pain: Secondary | ICD-10-CM

## 2013-07-08 DIAGNOSIS — Z9884 Bariatric surgery status: Secondary | ICD-10-CM

## 2013-07-08 DIAGNOSIS — K589 Irritable bowel syndrome without diarrhea: Secondary | ICD-10-CM

## 2013-07-08 LAB — CBC
HCT: 39.3 % (ref 36.0–46.0)
Hemoglobin: 13.3 g/dL (ref 12.0–15.0)
MCHC: 34 g/dL (ref 30.0–36.0)
MCV: 85.4 fl (ref 78.0–100.0)
Platelets: 337 10*3/uL (ref 150.0–400.0)

## 2013-07-08 LAB — URINALYSIS WITH CULTURE, IF INDICATED
Bilirubin Urine: NEGATIVE
Leukocytes, UA: NEGATIVE
Nitrite: NEGATIVE
Urobilinogen, UA: 0.2 (ref 0.0–1.0)
pH: 6.5 (ref 5.0–8.0)

## 2013-07-08 NOTE — Patient Instructions (Addendum)
You have been scheduled for an endoscopy with propofol. Please follow written instructions given to you at your visit today. If you use inhalers (even only as needed), please bring them with you on the day of your procedure. Your physician has requested that you go to [email or web address] and enter the access code given to you at your visit today. This web site gives a general overview about your procedure. However, you should still follow specific instructions given to you by our office regarding your preparation for the procedure.   Your physician has requested that you go to the basement for the following lab work before leaving today: U/A  CBC  Please go to Radiology for an X-ray                                               We are excited to introduce MyChart, a new best-in-class service that provides you online access to important information in your electronic medical record. We want to make it easier for you to view your health information - all in one secure location - when and where you need it. We expect MyChart will enhance the quality of care and service we provide.  When you register for MyChart, you can:    View your test results.    Request appointments and receive appointment reminders via email.    Request medication renewals.    View your medical history, allergies, medications and immunizations.    Communicate with your physician's office through a password-protected site.    Conveniently print information such as your medication lists.  To find out if MyChart is right for you, please talk to a member of our clinical staff today. We will gladly answer your questions about this free health and wellness tool.  If you are age 35 or older and want a member of your family to have access to your record, you must provide written consent by completing a proxy form available at our office. Please speak to our clinical staff about guidelines regarding accounts for patients younger  than age 35.  As you activate your MyChart account and need any technical assistance, please call the MyChart technical support line at [phone]) or email your question to [email or web address]. If you email your question(s), please include your name, a return phone number and the best time to reach you.  If you have non-urgent health-related questions, you can send a message to our office through MyChart at [email or web address]. If you have a medical emergency, call 911.  Thank you for using MyChart as your new health and wellness resource!   MyChart licensed from [employer],  [id number]. Patents Pending.

## 2013-07-08 NOTE — Progress Notes (Signed)
  History of Present Illness:  Patient is a 37 year old female known to Dr. Leone Payor for history of irritable bowel syndrome .She describes  Chronic constipation/ loose stools for which she takes dicyclomine as needed. She is s/p lap band and hiatal hernia repair July 2013.   Patient comes in today for evaluation of abdominal pain. One day in early July she developed LUQ pain following breakfast. Pain lasted several hours, was unrelieved with BM or Bentyl.. Pain subsided around bedtime and she never had it again until this weekend. Patient did see surgery sometime in July. Pain was not felt to be related to lap band.   This weekend patient began having twinges of same type pain with .  Significant escalation of pain yesterday. Patient tried Tums, Gas-X and Bentyl without relief. Pain is under left anterior rib cage and also LUQ. It is not associated with physical activity, not exacerbated by coughing or deep breaths but definitely worse with eating. No associated nausea. Belching and flatus do help to some degree. No fever. No urinary symptoms. No recent injuries or new exercises. Today pain still present but tolerable. Last BM was two days ago. Patient takes Zantac every day for allergies as well as history of GERD.   Current Medications, Allergies, Past Medical History, Past Surgical History, Family History and Social History were reviewed in [employer] record.  Physical Exam: General: pleasant obese black female in no acute distress Head: Normocephalic and atraumatic Eyes:  sclerae anicteric, conjunctiva pink  Ears: Normal auditory acuity Lungs: Clear throughout to auscultation Heart: Regular rate and rhythm Abdomen: Soft, obese, mild LUQ tenderness.  No masses, no hepatomegaly. Normal bowel sounds. Negative Carnett's. No left CVA tenderness Musculoskeletal: Symmetrical with no gross deformities. No rib tenderness.  Extremities: No edema  Neurological: Alert oriented x 4,  grossly nonfocal Psychological:  Alert and cooperative. Normal mood and affect  Assessment and Recommendations: 1. LUQ pain, worse with meals. Patient had her first episode of pain in July and second episode over the weekend. Etiology not clear. Belching and flatus help. This could be functional. Will obtain KUB to rule out partial obstructive process as well as evaluate for constipation. Will obtain basic labs and u/a as well. If studies negative and pain persists then will proceed with EGD.   2. Lap band placed July 2013. She had hiatal hernia repair at same time. Total weight loss 70 pounds. Will need to talk with Dr. Leone Payor about whether band will interfere with ability to perform EGD.   3. Chronic IBS. Patient feels BMs at baseline. Continue Bentyl as needed.

## 2013-07-09 ENCOUNTER — Encounter: Payer: Self-pay | Admitting: Internal Medicine

## 2013-07-11 NOTE — Progress Notes (Signed)
Quick Note:  Lap band not a problem for EGD ______

## 2013-07-11 NOTE — Progress Notes (Signed)
Egd reasonable - could also need barium study for episodic LUQ pain. Will discuss

## 2013-07-24 ENCOUNTER — Ambulatory Visit (INDEPENDENT_AMBULATORY_CARE_PROVIDER_SITE_OTHER): Payer: Managed Care, Other (non HMO) | Admitting: Physician Assistant

## 2013-07-24 ENCOUNTER — Encounter (INDEPENDENT_AMBULATORY_CARE_PROVIDER_SITE_OTHER): Payer: Self-pay

## 2013-07-24 VITALS — BP 116/72 | HR 80 | Temp 96.7°F | Resp 16 | Ht 62.0 in | Wt 276.2 lb

## 2013-07-24 DIAGNOSIS — R1012 Left upper quadrant pain: Secondary | ICD-10-CM

## 2013-07-24 DIAGNOSIS — Z9884 Bariatric surgery status: Secondary | ICD-10-CM

## 2013-07-24 NOTE — Patient Instructions (Signed)
Take omeprazole as directed. Return as appointed prior to your endoscopy, then again after your procedure. Call if you feel worse.

## 2013-07-24 NOTE — Progress Notes (Addendum)
  HISTORY: Janet Blanchard is a 37 y.o.female who received an AP-Standard lap-band in July 2013 by Dr. Johna Sheriff. She comes in with stable weight since her last visit but her left upper quadrant/subcostal abdominal pain has returned. She describes it as aching or grabbing and occurs after a meal. It happens infrequently but does cause distress when it occurs. She says she's experiencing it now. She doesn't believe it's a result of constipation as she had 4 bowel movements yesterday. She has no fevers, no nausea or vomiting. She has no pain around her port. No melena/hematochezia.  VITAL SIGNS: Filed Vitals:   07/24/13 1617  BP: 116/72  Pulse: 80  Temp: 96.7 F (35.9 C)  Resp: 16    PHYSICAL EXAM: Physical exam reveals a very well-appearing 38 y.o.female in no apparent distress Neurologic: Awake, alert, oriented Psych: Bright affect, conversant Respiratory: Breathing even and unlabored. No stridor or wheezing Extremities: Atraumatic, good range of motion. Skin: Warm, Dry, no rashes Musculoskeletal: Normal gait, Joints normal Abdomen: Soft/nontender/nondistended. Incisions well-healed. No port tenderness or erythema on the surrounding skin suggestive of an erosion. No rebound. No masses.  ASSESMENT: 37 y.o.  female  s/p AP-Standard lap-band.   PLAN: She is to have an EGD on 11/4 with [facility]. I asked her to return on the Thursday prior to have some fluid removed to ease her EGD procedure, Then return the day after for a possible refill and to go over her gastroenterologist's findings. She voiced understanding and agreement. Meantime, I suggested she may try omeprazole or Nexium to see if there is any improvement.  ATTENDING ADDENDUM:  Her abd xray that GI ordered didn't include upper abd. Had normal UGI in 2/14. Agree with fluid removal around time EGD. If EGD unremarkable will need repeat abd series to evaluate lapband position  Mary Sella. Andrey Campanile, MD, FACS General, Bariatric, &  Minimally Invasive Surgery [facility], [place]

## 2013-08-06 ENCOUNTER — Other Ambulatory Visit: Payer: Self-pay | Admitting: *Deleted

## 2013-08-06 ENCOUNTER — Telehealth: Payer: Self-pay | Admitting: *Deleted

## 2013-08-06 DIAGNOSIS — R1012 Left upper quadrant pain: Secondary | ICD-10-CM

## 2013-08-06 NOTE — Telephone Encounter (Signed)
Called and ask the patient to call me. I explained due to BMI restrictions she is just over the required BMI and we will have to schedule her with Dr. Leone Payor the next week. I offered the date of 08-18-2013.

## 2013-08-06 NOTE — Telephone Encounter (Signed)
The patient returned my call. She is okay with the hospital appointment for the EGD on 08-18-2013.  I explained she needs to arrive at 9:30 am.  She is have nothing by mouth after 7 am.  No food after midnight. She cal have clear liquids up to 7 am.  She has someone that can accompany her.  She asked what the BMI limit was in our [facility]. I told her 50.  She said she was just over 50 at 50.74.

## 2013-08-07 ENCOUNTER — Encounter (INDEPENDENT_AMBULATORY_CARE_PROVIDER_SITE_OTHER): Payer: Managed Care, Other (non HMO)

## 2013-08-12 ENCOUNTER — Encounter: Payer: Managed Care, Other (non HMO) | Admitting: Internal Medicine

## 2013-08-12 ENCOUNTER — Telehealth: Payer: Self-pay | Admitting: Internal Medicine

## 2013-08-12 NOTE — Telephone Encounter (Signed)
Called and tried to return the patient's call .  I left her a message to please call me back.

## 2013-08-12 NOTE — Telephone Encounter (Signed)
The patient returned my call and asked a question about her insurance. She was worried the procedure would cost more at hospital. Per Kathreen Cosier, the location does not matter, procedure will cost the same at hospital. Patient satisifed with answer.

## 2013-08-14 ENCOUNTER — Ambulatory Visit (INDEPENDENT_AMBULATORY_CARE_PROVIDER_SITE_OTHER): Payer: Managed Care, Other (non HMO) | Admitting: Physician Assistant

## 2013-08-14 ENCOUNTER — Encounter (INDEPENDENT_AMBULATORY_CARE_PROVIDER_SITE_OTHER): Payer: Self-pay

## 2013-08-14 VITALS — BP 118/70 | HR 96 | Temp 98.1°F | Resp 16 | Ht 62.0 in | Wt 276.0 lb

## 2013-08-14 DIAGNOSIS — Z4651 Encounter for fitting and adjustment of gastric lap band: Secondary | ICD-10-CM

## 2013-08-14 NOTE — Progress Notes (Signed)
  HISTORY: Janet Blanchard is a 37 y.o.female who received an AP-Standard lap-band in July 2013 by Dr. Johna Sheriff. She comes in today in preparation for her EGD this coming Monday. She reports no further pain symptoms since her last visit here. She has also cut out sodas.  VITAL SIGNS: Filed Vitals:   08/14/13 1113  BP: 118/70  Pulse: 96  Temp: 98.1 F (36.7 C)  Resp: 16    PHYSICAL EXAM: Physical exam reveals a very well-appearing 37 y.o.female in no apparent distress Neurologic: Awake, alert, oriented Psych: Bright affect, conversant Respiratory: Breathing even and unlabored. No stridor or wheezing Abdomen: Soft, nontender, nondistended to palpation. Incisions well-healed. No incisional hernias. Port easily palpated. Extremities: Atraumatic, good range of motion.  ASSESMENT: 37 y.o.  female  s/p AP-Standard lap-band.   PLAN: The patient's port was accessed with a 20G Huber needle without difficulty. Clear fluid was aspirated and 3 mL saline was removed from the port to give a total predicted volume of 3.45 mL. The patient was advised to concentrate on healthy food choices and to avoid slider foods high in fats and carbohydrates.

## 2013-08-14 NOTE — Patient Instructions (Signed)
Return in one week. Focus on good food choices as well as physical activity. Return sooner if you have an increase in hunger, portion sizes or weight. Return also for difficulty swallowing, night cough, reflux.   

## 2013-08-15 ENCOUNTER — Encounter (HOSPITAL_COMMUNITY): Payer: Self-pay | Admitting: *Deleted

## 2013-08-15 ENCOUNTER — Encounter (HOSPITAL_COMMUNITY): Payer: Self-pay | Admitting: Pharmacy Technician

## 2013-08-18 ENCOUNTER — Encounter (HOSPITAL_COMMUNITY)
Admission: RE | Disposition: A | Discharge status: Home / Self Care | Payer: Self-pay | Source: Ambulatory Visit | Attending: Internal Medicine

## 2013-08-18 ENCOUNTER — Ambulatory Visit (HOSPITAL_COMMUNITY): Payer: Managed Care, Other (non HMO) | Admitting: Anesthesiology

## 2013-08-18 ENCOUNTER — Ambulatory Visit (HOSPITAL_COMMUNITY)
Admission: RE | Admit: 2013-08-18 | Discharge: 2013-08-18 | Disposition: A | Discharge status: Home / Self Care | Payer: Managed Care, Other (non HMO) | Source: Ambulatory Visit | Attending: Internal Medicine | Admitting: Internal Medicine

## 2013-08-18 ENCOUNTER — Encounter (HOSPITAL_COMMUNITY): Payer: Managed Care, Other (non HMO) | Admitting: Anesthesiology

## 2013-08-18 ENCOUNTER — Encounter (HOSPITAL_COMMUNITY): Payer: Self-pay | Admitting: *Deleted

## 2013-08-18 DIAGNOSIS — K219 Gastro-esophageal reflux disease without esophagitis: Secondary | ICD-10-CM | POA: Insufficient documentation

## 2013-08-18 DIAGNOSIS — Z9884 Bariatric surgery status: Secondary | ICD-10-CM | POA: Insufficient documentation

## 2013-08-18 DIAGNOSIS — Z79899 Other long term (current) drug therapy: Secondary | ICD-10-CM | POA: Insufficient documentation

## 2013-08-18 DIAGNOSIS — K5900 Constipation, unspecified: Secondary | ICD-10-CM | POA: Insufficient documentation

## 2013-08-18 DIAGNOSIS — R1012 Left upper quadrant pain: Secondary | ICD-10-CM | POA: Insufficient documentation

## 2013-08-18 HISTORY — PX: ESOPHAGOGASTRODUODENOSCOPY: SHX5428

## 2013-08-18 SURGERY — EGD (ESOPHAGOGASTRODUODENOSCOPY)
Anesthesia: Monitor Anesthesia Care

## 2013-08-18 MED ORDER — POLYETHYLENE GLYCOL 3350 17 GM/SCOOP PO POWD: 17.0000 g | Freq: Every day | ORAL | Status: DC

## 2013-08-18 MED ORDER — KETAMINE HCL 10 MG/ML IJ SOLN
INTRAMUSCULAR | Status: DC | PRN
  Administered 2013-08-18: 20 mg via INTRAVENOUS

## 2013-08-18 MED ORDER — SODIUM CHLORIDE 0.9 % IV SOLN: INTRAVENOUS | Status: DC

## 2013-08-18 MED ORDER — ONDANSETRON HCL 4 MG/2ML IJ SOLN
INTRAMUSCULAR | Status: DC | PRN
  Administered 2013-08-18: 4 mg via INTRAVENOUS

## 2013-08-18 MED ORDER — PROPOFOL INFUSION 10 MG/ML OPTIME
INTRAVENOUS | Status: DC | PRN
  Administered 2013-08-18: 100 ug/kg/min via INTRAVENOUS

## 2013-08-18 MED ORDER — LACTATED RINGERS IV SOLN
INTRAVENOUS | Status: DC
  Administered 2013-08-18: 1000 mL via INTRAVENOUS

## 2013-08-18 MED ORDER — GLYCOPYRROLATE 0.2 MG/ML IJ SOLN
INTRAMUSCULAR | Status: DC | PRN
  Administered 2013-08-18: 0.2 mg via INTRAVENOUS

## 2013-08-18 NOTE — Anesthesia Preprocedure Evaluation (Addendum)
Anesthesia Evaluation  Patient identified by MRN, date of birth, ID band Patient awake    Reviewed: Allergy & Precautions, H&P , NPO status , Patient's Chart, lab work & pertinent test results  Airway Mallampati: II TM Distance: >3 FB Neck ROM: full    Dental no notable dental hx. (+) Teeth Intact and Dental Advisory Given   Pulmonary neg pulmonary ROS,  breath sounds clear to auscultation  Pulmonary exam normal       Cardiovascular Exercise Tolerance: Good negative cardio ROS  Rhythm:regular Rate:Normal     Neuro/Psych negative neurological ROS  negative psych ROS   GI/Hepatic negative GI ROS, Neg liver ROS,   Endo/Other  negative endocrine ROSMorbid obesity  Renal/GU negative Renal ROS  negative genitourinary   Musculoskeletal   Abdominal (+) + obese,   Peds  Hematology negative hematology ROS (+)   Anesthesia Other Findings   Reproductive/Obstetrics negative OB ROS                         Anesthesia Physical Anesthesia Plan  ASA: III  Anesthesia Plan: MAC   Post-op Pain Management:    Induction:   Airway Management Planned: Simple Face Mask  Additional Equipment:   Intra-op Plan:   Post-operative Plan:   Informed Consent: I have reviewed the patients History and Physical, chart, labs and discussed the procedure including the risks, benefits and alternatives for the proposed anesthesia with the patient or authorized representative who has indicated his/her understanding and acceptance.   Dental Advisory Given  Plan Discussed with: CRNA and Surgeon  Anesthesia Plan Comments:        Anesthesia Quick Evaluation

## 2013-08-18 NOTE — Transfer of Care (Signed)
Immediate Anesthesia Transfer of Care Note  Patient: Janet Blanchard  Procedure(s) Performed: Procedure(s) (LRB): ESOPHAGOGASTRODUODENOSCOPY (EGD) (N/A)  Patient Location: PACU  Anesthesia Type: MAC  Level of Consciousness: awake, patient cooperative and responds to stimulation  Airway & Oxygen Therapy: Patient Spontanous Breathing and Patient connected to face mask oxgen  Post-op Assessment: Report given to PACU RN and Post -op Vital signs reviewed and stable  Post vital signs: Reviewed and stable  Complications: No apparent anesthesia complications

## 2013-08-18 NOTE — Anesthesia Postprocedure Evaluation (Signed)
  Anesthesia Post-op Note  Patient: Janet Blanchard  Procedure(s) Performed: Procedure(s) (LRB): ESOPHAGOGASTRODUODENOSCOPY (EGD) (N/A)  Patient Location: PACU  Anesthesia Type: MAC  Level of Consciousness: awake and alert   Airway and Oxygen Therapy: Patient Spontanous Breathing  Post-op Pain: mild  Post-op Assessment: Post-op Vital signs reviewed, Patient's Cardiovascular Status Stable, Respiratory Function Stable, Patent Airway and No signs of Nausea or vomiting  Last Vitals:  Filed Vitals:   08/18/13 1230  BP: 135/95  Temp:   Resp: 24    Post-op Vital Signs: stable   Complications: No apparent anesthesia complications

## 2013-08-18 NOTE — H&P (Signed)
[facility] History and Physical   Primary Care Physician:  Colette Ribas, MD   Reason for Procedure:   LUQ pain with lap band in place  Plan:    EGD     HPI: Janet Blanchard is a 37 y.o. female with lap band placed 2013. She began having sharp and intense crampy LUQ pain this Famm. Had to leave work first time. Has constipation. Has an urge to defecate with pain sometimes. Seems better after taking Prilosec x 2 weeks. Had some fluid withdrawn from port last week in anticipation of EGD.   Past Medical History  Diagnosis Date  . IBS (irritable bowel syndrome)   . Anxiety and depression   . Obesity   . Migraine headache   . Allergic rhinitis   . Sinusitis   . Osteopenia   . DDD (degenerative disc disease)     L4-S1 with chronic back pain  . GERD (gastroesophageal reflux disease)   . Herniated disc     L 3-4, L 4-5, S1  . Hiatal hernia   . Vitamin D deficiency   . Cervical dysplasia   . Frequency   . Nocturia   . Back pain     Past Surgical History  Procedure Laterality Date  . Cholecystectomy  1/04  . Leep  2000  . Tonsillectomy  2005  . Septoplasty  2005  . Nasal turbinate reduction  2005  . Wisdom tooth extraction  2003  . Colonoscopy  09/07/2008    small internal hemorrhoids, otherwise normal (biopsies) into terminal ileum  . Esophagogastroduodenoscopy  7/07    hiatal hernia  . Sigmoidoscopy  7/07    ? colitis/proctitis - biopsies normal  . Cervical biopsy  w/ loop electrode excision    . Colposcopy    . Laparoscopic gastric banding  04/09/2012    Procedure: LAPAROSCOPIC GASTRIC BANDING;  Surgeon: [name], MD;  Location: [facility];  Service: General;  Laterality: N/A;    Prior to Admission medications   Medication Sig Start Date End Date Taking? Authorizing Provider  ALPRAZolam Prudy Feeler) 0.5 MG tablet Take 0.5 mg by mouth at bedtime as needed. Anxiety and sleep   Yes Historical Provider, MD  baclofen (LIORESAL) 10 MG tablet Take  10 mg by mouth 2 (two) times daily as needed (migraines).  01/07/13  Yes Historical Provider, MD  BIOTIN PO Take 1,000 mg by mouth daily.    Yes Historical Provider, MD  buPROPion (WELLBUTRIN) 100 MG tablet Take 100 mg by mouth 2 (two) times daily.  04/19/12  Yes Historical Provider, MD  caffeine 200 MG TABS Take 200 mg by mouth as needed. energy   Yes Historical Provider, MD  Cholecalciferol (VITAMIN D) 2000 UNITS CAPS Take 2 capsules by mouth daily.    Yes Historical Provider, MD  Cyanocobalamin (VITAMIN B 12 PO) Take 1 tablet by mouth daily.   Yes Historical Provider, MD  dicyclomine (BENTYL) 20 MG tablet Take 1 tablet (20 mg total) by mouth 2 (two) times daily as needed. 03/24/11  Yes Iva Boop, MD  Diphenhydramine-PE-APAP (BENADRYL ALLERGY/S[facility]) 12.5-5-325 MG TABS Take 1-2 tablets by mouth as needed. ALLERGIES AND HEADACHE   Yes Historical Provider, MD  fexofenadine (ALLEGRA) 180 MG tablet Take 180 mg by mouth daily.   Yes Historical Provider, MD  ibuprofen (ADVIL,MOTRIN) 200 MG tablet Take 400-600 mg by mouth every 6 (six) hours as needed for headache.   Yes Historical Provider, MD  loperamide (IMODIUM A-D) 2 MG  tablet Take 2 mg by mouth 4 (four) times daily as needed. DIARRHEA   Yes Historical Provider, MD  meclizine (ANTIVERT) 25 MG tablet Take 25 mg by mouth 3 (three) times daily as needed. DIZZINESS   Yes Historical Provider, MD  montelukast (SINGULAIR) 10 MG tablet Take 10 mg by mouth at bedtime. Pt out of medication currently   Yes Historical Provider, MD  ranitidine (ZANTAC) 150 MG tablet Take 150 mg by mouth daily.    Yes Historical Provider, MD  zonisamide (ZONEGRAN) 100 MG capsule Take 200 mg by mouth at bedtime.   Yes Historical Provider, MD  OVER THE COUNTER MEDICATION Take 1 tablet by mouth daily.    Historical Provider, MD    Current Facility-Administered Medications  Medication Dose Route Frequency Provider Last Rate Last Dose  . 0.9 %  sodium chloride infusion    Intravenous Continuous Iva Boop, MD      . lactated ringers infusion   Intravenous Continuous Iva Boop, MD 125 mL/hr at 08/18/13 1045 1,000 mL at 08/18/13 1045    Allergies as of 08/06/2013 - Review Complete 07/24/2013  Allergen Reaction Noted  . Levofloxacin Rash 06/01/2008  . Sulfasalazine Rash 06/01/2008    Family History  Problem Relation Age of Onset  . Diabetes Mother   . Hypertension Mother   . Hyperlipidemia Mother   . Kidney disease Maternal Grandfather   . Cirrhosis Maternal Grandfather     alcoholic  . Hypertension Maternal Grandfather   . Heart disease      great uncle  . Uterine cancer Maternal Grandmother   . Hypertension Maternal Grandmother   . Hyperlipidemia Maternal Grandmother   . Colon cancer Neg Hx   . Hypertension Father   . Hypertension Paternal Grandmother   . Hypertension Paternal Grandfather     History   Social History  . Marital Status: Single    Spouse Name: N/A    Number of Children: 0  . Years of Education: N/A   Occupational History  . RN   .  [employer]   Social History Main Topics  . Smoking status: Never Smoker   . Smokeless tobacco: Never Used  . Alcohol Use: Yes     Comment: 3 glasses/week  . Drug Use: No  . Sexual Activity: Not Currently    Birth Control/ Protection: None, IUD     Comment: Mirena inserted 05-10-12      Review of Systems: All other review of systems negative except as mentioned in the HPI.  Physical Exam: Vital signs in last 24 hours: Temp:  [98.2 F (36.8 C)] 98.2 F (36.8 C) (11/10 1040) Resp:  [22] 22 (11/10 1040) BP: (121)/(82) 121/82 mmHg (11/10 1040) SpO2:  [100 %] 100 % (11/10 1040)   General:   Alert,  Well-developed, well-nourished, pleasant and cooperative in NAD Lungs:  Clear throughout to auscultation.   Heart:  Regular rate and rhythm; no murmurs, clicks, rubs,  or gallops. Abdomen:  Soft, nontender and nondistended. Normal bowel sounds.   Neuro/Psych:  Alert and cooperative.  Normal mood and affect. A and O x 3   @Genni Buske  Sena Slate, MD, [facility] [phone] (pager) 08/18/2013 11:26 AM@

## 2013-08-18 NOTE — Op Note (Signed)
[facility] [address] [place], [address]   ENDOSCOPY PROCEDURE REPORT  PATIENT: Janet, Blanchard  MR#: [id number] BIRTHDATE: December 09, 1975 , 37  yrs. old GENDER: Female ENDOSCOPIST: Iva Boop, MD, [facility] REFERRED BY: PROCEDURE DATE:  08/18/2013 PROCEDURE:  EGD, diagnostic ASA CLASS:     Class III INDICATIONS:  abdominal pain in upper left quadrant. MEDICATIONS: See Anesthesia Report and MAC sedation, administered by CRNA TOPICAL ANESTHETIC: none  DESCRIPTION OF PROCEDURE: After the risks benefits and alternatives of the procedure were thoroughly explained, informed consent was obtained.  The    endoscope was introduced through the mouth and advanced to the second portion of the duodenum. Without limitations.  The instrument was slowly withdrawn as the mucosa was fully examined.      The upper, middle and distal third of the esophagus were carefully inspected and no abnormalities were noted.  The z-line was well seen at the GEJ.  The endoscope was pushed into the fundus which was normal including a retroflexed view.  The antrum, gastric body, first and second part of the duodenum were unremarkable. Retroflexed views revealed no abnormalities.     The scope was then withdrawn from the patient and the procedure completed.  COMPLICATIONS: There were no complications. ENDOSCOPIC IMPRESSION: Normal EGD - lap band is deflated and not seen  RECOMMENDATIONS: Observe since pain better - treat constipation w/ MiraLax   eSigned:  Iva Boop, MD, [facility] 08/18/2013 12:15 PM   CC:The Patient, Jaclynn Guarneri, MD and Assunta Found, MD

## 2013-08-19 ENCOUNTER — Encounter (HOSPITAL_COMMUNITY): Payer: Self-pay | Admitting: Internal Medicine

## 2013-08-21 ENCOUNTER — Encounter (INDEPENDENT_AMBULATORY_CARE_PROVIDER_SITE_OTHER): Payer: Managed Care, Other (non HMO)

## 2013-08-21 ENCOUNTER — Encounter (INDEPENDENT_AMBULATORY_CARE_PROVIDER_SITE_OTHER): Payer: Self-pay

## 2013-08-21 ENCOUNTER — Ambulatory Visit (INDEPENDENT_AMBULATORY_CARE_PROVIDER_SITE_OTHER): Payer: Managed Care, Other (non HMO) | Admitting: Physician Assistant

## 2013-08-21 VITALS — BP 112/68 | HR 72 | Temp 98.1°F | Resp 16 | Ht 62.0 in | Wt 278.8 lb

## 2013-08-21 DIAGNOSIS — Z4651 Encounter for fitting and adjustment of gastric lap band: Secondary | ICD-10-CM

## 2013-08-21 NOTE — Progress Notes (Signed)
  HISTORY: Janet Blanchard is a 37 y.o.female who received an AP-Standard lap-band in July 2013 by Dr. Johna Sheriff. She had an EGD earlier this week which was negative. She's back for fluid replacement after her procedure.  VITAL SIGNS: Filed Vitals:   08/21/13 1412  BP: 112/68  Pulse: 72  Temp: 98.1 F (36.7 C)  Resp: 16    PHYSICAL EXAM: Physical exam reveals a very well-appearing 37 y.o.female in no apparent distress Neurologic: Awake, alert, oriented Psych: Bright affect, conversant Respiratory: Breathing even and unlabored. No stridor or wheezing Abdomen: Soft, nontender, nondistended to palpation. Incisions well-healed. No incisional hernias. Port easily palpated. Extremities: Atraumatic, good range of motion.  ASSESMENT: 37 y.o.  female  s/p AP-Standard lap-band.   PLAN: The patient's port was accessed with a 20G Huber needle without difficulty. Clear fluid was aspirated and 2.5 mL saline was added to the port to give a total predicted volume of 5.95 mL. The patient was able to swallow water without difficulty following the procedure and was instructed to take clear liquids for the next 24-48 hours and advance slowly as tolerated. She will likely need the remaining 0.5 mL replaced as doing so now would likely yield obstruction. We'll see her back in about 3 weeks.

## 2013-08-21 NOTE — Patient Instructions (Signed)

## 2013-08-28 ENCOUNTER — Encounter (INDEPENDENT_AMBULATORY_CARE_PROVIDER_SITE_OTHER): Payer: Managed Care, Other (non HMO)

## 2013-09-11 ENCOUNTER — Encounter (INDEPENDENT_AMBULATORY_CARE_PROVIDER_SITE_OTHER): Payer: Managed Care, Other (non HMO)

## 2013-09-25 ENCOUNTER — Encounter (INDEPENDENT_AMBULATORY_CARE_PROVIDER_SITE_OTHER): Payer: Self-pay

## 2013-09-25 ENCOUNTER — Ambulatory Visit (INDEPENDENT_AMBULATORY_CARE_PROVIDER_SITE_OTHER): Payer: Managed Care, Other (non HMO) | Admitting: Physician Assistant

## 2013-09-25 VITALS — BP 118/70 | HR 80 | Temp 98.7°F | Resp 14 | Ht 62.0 in | Wt 281.8 lb

## 2013-09-25 DIAGNOSIS — Z4651 Encounter for fitting and adjustment of gastric lap band: Secondary | ICD-10-CM

## 2013-09-25 NOTE — Progress Notes (Signed)
  HISTORY: Janet Blanchard is a 37 y.o.female who received an AP-Standard lap-band in July 2013 by Dr. Johna Sheriff. She comes in for serial fills following an upper endoscopy. She continues to have hunger, larger portion sizes and weight gain. She has no obstructive symptoms.  VITAL SIGNS: Filed Vitals:   09/25/13 1328  BP: 118/70  Pulse: 80  Temp: 98.7 F (37.1 C)  Resp: 14    PHYSICAL EXAM: Physical exam reveals a very well-appearing 37 y.o.female in no apparent distress Neurologic: Awake, alert, oriented Psych: Bright affect, conversant Respiratory: Breathing even and unlabored. No stridor or wheezing Abdomen: Soft, nontender, nondistended to palpation. Incisions well-healed. No incisional hernias. Port easily palpated. Extremities: Atraumatic, good range of motion.  ASSESMENT: 37 y.o.  female  s/p AP-Standard lap-band.   PLAN: The patient's port was accessed with a 20G Huber needle without difficulty. Clear fluid was aspirated and 0.75 mL saline was added to the port to give a total predicted volume of 6.65 mL. The patient was able to swallow water without difficulty following the procedure and was instructed to take clear liquids for the next 24-48 hours and advance slowly as tolerated. We'll have her back in 4-6 weeks.

## 2013-09-25 NOTE — Patient Instructions (Signed)

## 2013-10-30 ENCOUNTER — Ambulatory Visit (INDEPENDENT_AMBULATORY_CARE_PROVIDER_SITE_OTHER): Payer: Managed Care, Other (non HMO) | Admitting: Physician Assistant

## 2013-10-30 ENCOUNTER — Encounter (INDEPENDENT_AMBULATORY_CARE_PROVIDER_SITE_OTHER): Payer: Self-pay

## 2013-10-30 VITALS — BP 126/82 | HR 71 | Temp 97.8°F | Resp 16 | Ht 62.0 in | Wt 279.4 lb

## 2013-10-30 DIAGNOSIS — Z4651 Encounter for fitting and adjustment of gastric lap band: Secondary | ICD-10-CM

## 2013-10-30 MED ORDER — OMEPRAZOLE 20 MG PO CPDR: 20.0000 mg | DELAYED_RELEASE_CAPSULE | Freq: Every day | ORAL | Status: DC

## 2013-10-30 NOTE — Progress Notes (Signed)
  HISTORY: Janet Blanchard is a 38 y.o.female who received an AP-Standard lap-band in July 2013 by Dr. Johna SheriffHoxworth. She comes in with a modest 2.5 lb weight loss since her last visit in December. She's lost 42 lbs since surgery. She is doing well overall with no obstructive symptoms but she does have continued hunger and larger than desired portion sizes. She is having good success with Omeprazole and would like a prescription.  VITAL SIGNS: Filed Vitals:   10/30/13 1607  BP: 126/82  Pulse: 71  Temp: 97.8 F (36.6 C)  Resp: 16    PHYSICAL EXAM: Physical exam reveals a very well-appearing 38 y.o.female in no apparent distress Neurologic: Awake, alert, oriented Psych: Bright affect, conversant Respiratory: Breathing even and unlabored. No stridor or wheezing Abdomen: Soft, nontender, nondistended to palpation. Incisions well-healed. No incisional hernias. Port easily palpated. Extremities: Atraumatic, good range of motion.  ASSESMENT: 38 y.o.  female  s/p AP-Standard lap-band.   PLAN: The patient's port was accessed with a 20G Huber needle without difficulty. Clear fluid was aspirated and 0.25 mL saline was added to the port to give a total predicted volume of 6.9 mL. The patient was able to swallow water without difficulty following the procedure and was instructed to take clear liquids for the next 24-48 hours and advance slowly as tolerated. I wrote her for Omeprazole 20mg  daily for a year.

## 2013-10-30 NOTE — Patient Instructions (Signed)

## 2013-11-07 ENCOUNTER — Encounter: Payer: Managed Care, Other (non HMO) | Attending: Family Medicine | Admitting: Dietician

## 2013-11-07 VITALS — Ht 62.0 in | Wt 276.0 lb

## 2013-11-07 DIAGNOSIS — E669 Obesity, unspecified: Secondary | ICD-10-CM | POA: Insufficient documentation

## 2013-11-07 DIAGNOSIS — Z9884 Bariatric surgery status: Secondary | ICD-10-CM | POA: Insufficient documentation

## 2013-11-07 DIAGNOSIS — Z713 Dietary counseling and surveillance: Secondary | ICD-10-CM | POA: Insufficient documentation

## 2013-11-07 DIAGNOSIS — Z6841 Body Mass Index (BMI) 40.0 and over, adult: Secondary | ICD-10-CM | POA: Insufficient documentation

## 2013-11-07 NOTE — Patient Instructions (Addendum)
Goals:  Follow Phase 3B: High Protein + Non-Starchy Vegetables  Have a max of 15 grams of carbs per MEAL or SNACK  Add protein serving to all carbs (meal or snack)  Increase lean protein foods to meet 60-80g goal  Add protein shakes if needed   Increase sugar free, fluid intake to 64oz +  Eat protein first (2-3 oz at meals, 1-2 oz at snacks if needed), then non starchy vegetables, and then have up to 15 g carbs   Aim for >30 min of physical activity daily

## 2013-11-07 NOTE — Progress Notes (Signed)
  Follow-up visit:  18 Months Post-Operative LAGB Surgery  Medical Nutrition Therapy:  Appt start time: 800  End time:  563.  Primary concerns today: Post-operative Bariatric Surgery Nutrition Management. Janet Blanchard returns for 18 mo f/u with 1.5 lb wt loss since last visit. States that in October most of her fluid was taken out of her band since she was having abdominal pain and needed an endoscopy. Weight increased to 283 lbs. Stated that Jonni Sanger started putting fluid back in starting in November. Still feels like there is "a disconnect between her stomach and her head" and will keep eating even if her stomach hurts. States that she is hungry all the time and will stop if feels satisfied.   Eating a lot of carbs and fats and not getting in enough protein, fluid, or exercise. Discussed the importance of following the bariatric diet in order to achieve weight loss.    Surgery date: 04/09/12 Surgery type: LAGB Start weight at [facility]: 317.2 lbs  Last weight: 287.0 lbs Weight today: 276 lbs Weight change: 1.5 lbs (LOSS) Total weight lost: 39.7 lbs  Weight goal: 199 lbs % Weight goal met: 34%  TANITA  BODY COMP RESULTS  06/04/12 08/07/12 11/07/12 05/26/13 11/08/13   BMI (kg/m^2) 52.7 52.0 52.5 50.8 50.5   Fat Mass (lbs) 155.0 151.5 150.5 146.0 147.5   Fat Free Mass (lbs) 133.0 133.0 136.5 131.5 128.5   Total Body Water (lbs) 97.5 97.5 100.0 96.5 94.0   24-hr recall: B (9 AM): oatmeal "Weight Control" with 2% milk (9 g) Snk (AM): NONE L (12 PM): cafeteria - meat lasagna and brussels sprouts or 4 oz salmon  Snk (PM): cheese nips D (PM): 1.5 chicken tenders and fries  Snk (PM): None, may have a carb  Fluid intake: Coffee (10-12 oz - regular) w/ [employer] Creamer, water (24 oz) OR pink lemonade (16 oz) (40-60 oz total)   Estimated total protein intake: 48 g or less  Medications: See medication list.  Supplementation: Not taking at this time "they are expensive"   Using straws:  Yes Drinking while eating: "sometimes feels like she has to" Hair loss: No Carbonated beverages: Yes - 2 x week, try to cut back   N/V/D/C:  No Last Lap-Band fill: 10/30/13 - 0.25 cc added. Total fluid is 6.9 cc.   Recent physical activity:  None  Progress Towards Goal(s):  Some progress.   Nutritional Diagnosis:  NI-3.1 Inadequate fluid intake related to LAGB surgery as evidenced by patient consuming <65% of recommended fluid intake.  Glidden-3.3 Overweight/obesity related to past poor dietary habits and physical inactivity as evidenced by patient w/ recent LAGB surgery following dietary guidelines for continued weight loss.    Intervention:  Nutrition education. Emphasized the importance of getting in adequate fluid and protein and limiting carbs and fats in order to lose weight. Also discussed the importance of resuming vitamin and calcium supplements and exercise.    Monitoring/Evaluation:  Dietary intake, exercise, lap band fills, and body weight. Follow up in 3 months weeks for 21 month post op visit.

## 2013-11-27 ENCOUNTER — Encounter (INDEPENDENT_AMBULATORY_CARE_PROVIDER_SITE_OTHER): Payer: Managed Care, Other (non HMO)

## 2013-11-28 ENCOUNTER — Encounter (INDEPENDENT_AMBULATORY_CARE_PROVIDER_SITE_OTHER): Payer: Self-pay | Admitting: Physician Assistant

## 2014-02-06 ENCOUNTER — Ambulatory Visit: Payer: Managed Care, Other (non HMO) | Admitting: Dietician

## 2014-02-13 ENCOUNTER — Encounter: Payer: Self-pay | Admitting: Gynecology

## 2014-02-13 ENCOUNTER — Ambulatory Visit (INDEPENDENT_AMBULATORY_CARE_PROVIDER_SITE_OTHER): Payer: Managed Care, Other (non HMO) | Admitting: Gynecology

## 2014-02-13 VITALS — BP 112/72

## 2014-02-13 DIAGNOSIS — Z113 Encounter for screening for infections with a predominantly sexual mode of transmission: Secondary | ICD-10-CM

## 2014-02-13 DIAGNOSIS — N949 Unspecified condition associated with female genital organs and menstrual cycle: Secondary | ICD-10-CM

## 2014-02-13 DIAGNOSIS — B3731 Acute candidiasis of vulva and vagina: Secondary | ICD-10-CM

## 2014-02-13 DIAGNOSIS — N898 Other specified noninflammatory disorders of vagina: Secondary | ICD-10-CM

## 2014-02-13 DIAGNOSIS — B373 Candidiasis of vulva and vagina: Secondary | ICD-10-CM

## 2014-02-13 LAB — WET PREP FOR TRICH, YEAST, CLUE
CLUE CELLS WET PREP: NONE SEEN
TRICH WET PREP: NONE SEEN

## 2014-02-13 MED ORDER — FLUCONAZOLE 100 MG PO TABS: ORAL_TABLET | ORAL | Status: DC

## 2014-02-13 NOTE — Patient Instructions (Signed)
Monilial Vaginitis  Vaginitis in a soreness, swelling and redness (inflammation) of the vagina and vulva. Monilial vaginitis is not a sexually transmitted infection.  CAUSES   Yeast vaginitis is caused by yeast (candida) that is normally found in your vagina. With a yeast infection, the candida has overgrown in number to a point that upsets the chemical balance.  SYMPTOMS   · White, thick vaginal discharge.  · Swelling, itching, redness and irritation of the vagina and possibly the lips of the vagina (vulva).  · Burning or painful urination.  · Painful intercourse.  DIAGNOSIS   Things that may contribute to monilial vaginitis are:  · Postmenopausal and virginal states.  · Pregnancy.  · Infections.  · Being tired, sick or stressed, especially if you had monilial vaginitis in the past.  · Diabetes. Good control will help lower the chance.  · Birth control pills.  · Tight fitting garments.  · Using bubble bath, feminine sprays, douches or deodorant tampons.  · Taking certain medications that kill germs (antibiotics).  · Sporadic recurrence can occur if you become ill.  TREATMENT   Your caregiver will give you medication.  · There are several kinds of anti monilial vaginal creams and suppositories specific for monilial vaginitis. For recurrent yeast infections, use a suppository or cream in the vagina 2 times a week, or as directed.  · Anti-monilial or steroid cream for the itching or irritation of the vulva may also be used. Get your caregiver's permission.  · Painting the vagina with methylene blue solution may help if the monilial cream does not work.  · Eating yogurt may help prevent monilial vaginitis.  HOME CARE INSTRUCTIONS   · Finish all medication as prescribed.  · Do not have sex until treatment is completed or after your caregiver tells you it is okay.  · Take warm sitz baths.  · Do not douche.  · Do not use tampons, especially scented ones.  · Wear cotton underwear.  · Avoid tight pants and panty  hose.  · Tell your sexual partner that you have a yeast infection. They should go to their caregiver if they have symptoms such as mild rash or itching.  · Your sexual partner should be treated as well if your infection is difficult to eliminate.  · Practice safer sex. Use condoms.  · Some vaginal medications cause latex condoms to fail. Vaginal medications that harm condoms are:  · Cleocin cream.  · Butoconazole (Femstat®).  · Terconazole (Terazol®) vaginal suppository.  · Miconazole (Monistat®) (may be purchased over the counter).  SEEK MEDICAL CARE IF:   · You have a temperature by mouth above 102° F (38.9° C).  · The infection is getting worse after 2 days of treatment.  · The infection is not getting better after 3 days of treatment.  · You develop blisters in or around your vagina.  · You develop vaginal bleeding, and it is not your menstrual period.  · You have pain when you urinate.  · You develop intestinal problems.  · You have pain with sexual intercourse.  Document Released: 07/05/2005 Document Revised: 12/18/2011 Document Reviewed: 03/19/2009  ExitCare® Patient Information ©2014 [employer].

## 2014-02-13 NOTE — Progress Notes (Signed)
   38 sure patient presented to the office today complaining of vaginal discharge. She has not been seen here in the office since last year. The patient states that she is going to the urgent care has been treated for bacterial vaginosis and yeast infection 3-4 times over the past 3 months. The patient has a Mirena IUD for contraception. Patient has 2-3 sexual contacts at the same time and has not had an STD workup.  Exam: Bartholin urethra Skene was within normal limits Vagina: Light white discharge was noted Cervix: IUD string visualized no discharge Uterus: Bimanual exam not done Adnexa: Bimanual exam not done Rectal exam: Not done  Wet prep demonstrated evidence of yeast  GC chlamydia culture pending  The patient stated that at work she had a recent hemoglobin A1c and was in the normal range. She had tested because her mother is diabetic.  Assessment/plan: Patient with recurrent yeast infection will be treated as follows: Diflucan 100 mg one by mouth today then she will take one by mouth q. weekly for 6 months. She will also use a vaginal probiotic gel twice a week in addition. A GC and chlamydia culture was obtained today results pending at time of this dictation. To complete a full STD workup and HIV, hepatitis B and C. and as well as RPR was obtained today. We'll notify her when the results become available.

## 2014-02-14 LAB — HEPATITIS C ANTIBODY: HCV Ab: NEGATIVE

## 2014-02-14 LAB — GC/CHLAMYDIA PROBE AMP
CT PROBE, AMP APTIMA: NEGATIVE
GC Probe RNA: NEGATIVE

## 2014-02-14 LAB — RPR

## 2014-02-14 LAB — HIV ANTIBODY (ROUTINE TESTING W REFLEX): HIV: NONREACTIVE

## 2014-02-14 LAB — HEPATITIS B SURFACE ANTIGEN: Hepatitis B Surface Ag: NEGATIVE

## 2014-02-18 ENCOUNTER — Telehealth: Payer: Self-pay | Admitting: *Deleted

## 2014-02-18 NOTE — Telephone Encounter (Signed)
Pt informed with results from OV 02/13/14 STD panel

## 2014-03-05 ENCOUNTER — Ambulatory Visit
Admission: RE | Admit: 2014-03-05 | Discharge: 2014-03-05 | Disposition: A | Discharge status: Home / Self Care | Payer: Managed Care, Other (non HMO) | Source: Ambulatory Visit | Attending: Physician Assistant | Admitting: Physician Assistant

## 2014-03-05 ENCOUNTER — Encounter (INDEPENDENT_AMBULATORY_CARE_PROVIDER_SITE_OTHER): Payer: Self-pay

## 2014-03-05 ENCOUNTER — Ambulatory Visit (INDEPENDENT_AMBULATORY_CARE_PROVIDER_SITE_OTHER): Payer: Managed Care, Other (non HMO) | Admitting: Physician Assistant

## 2014-03-05 VITALS — BP 130/78 | HR 76 | Temp 97.6°F | Ht 62.0 in | Wt 284.2 lb

## 2014-03-05 DIAGNOSIS — Z4651 Encounter for fitting and adjustment of gastric lap band: Secondary | ICD-10-CM

## 2014-03-05 NOTE — Patient Instructions (Signed)
Return in two weeks. Focus on good food choices as well as physical activity. Return sooner if you have an increase in hunger, portion sizes or weight. Return also for difficulty swallowing, night cough, reflux.   

## 2014-03-05 NOTE — Progress Notes (Signed)
  HISTORY: Janet Blanchard is a 38 y.o.female who received an AP-Standard lap-band in July 2013 by Dr. Johna Sheriff. She comes in with persistent regurgitation and obstructive symptoms that began shortly after her last fill in January. She reports being able to tolerate some foods on occasion like pizza and others high in carbohydrate and fat but proteins are very difficult to get down. She even notices problems with liquids on occasion. She has gained weight as a result of what appears to be maladaptive eating. She wants some fluid removed.  VITAL SIGNS: Filed Vitals:   03/05/14 0945  BP: 130/78  Pulse: 76  Temp: 97.6 F (36.4 C)    PHYSICAL EXAM: Physical exam reveals a very well-appearing 38 y.o.female in no apparent distress Neurologic: Awake, alert, oriented Psych: Bright affect, conversant Respiratory: Breathing even and unlabored. No stridor or wheezing Abdomen: Soft, nontender, nondistended to palpation. Incisions well-healed. No incisional hernias. Port easily palpated. Extremities: Atraumatic, good range of motion.  ASSESMENT: 38 y.o.  female  s/p AP-Standard lap-band.   PLAN: The patient's port was accessed with a 20G Huber needle without difficulty. Clear fluid was aspirated and 1 mL saline was removed from the port to give a total predicted volume of 5.9 mL. The patient was advised to concentrate on healthy food choices and to avoid slider foods high in fats and carbohydrates. She was able to swallow water with great ease following fluid removal - she noted a distinct difference. I've ordered a KUB today to evaluate band position. We'll have her back in two weeks for a recheck.

## 2014-03-19 ENCOUNTER — Encounter (INDEPENDENT_AMBULATORY_CARE_PROVIDER_SITE_OTHER): Payer: Self-pay

## 2014-03-19 ENCOUNTER — Ambulatory Visit (INDEPENDENT_AMBULATORY_CARE_PROVIDER_SITE_OTHER): Payer: Managed Care, Other (non HMO) | Admitting: Physician Assistant

## 2014-03-19 VITALS — BP 124/82 | HR 77 | Temp 97.5°F | Ht 62.0 in | Wt 289.8 lb

## 2014-03-19 DIAGNOSIS — Z4651 Encounter for fitting and adjustment of gastric lap band: Secondary | ICD-10-CM

## 2014-03-19 NOTE — Patient Instructions (Signed)

## 2014-03-19 NOTE — Progress Notes (Signed)
  HISTORY: Janet Blanchard is a 38 y.o.female who received an AP-Standard lap-band in July 2013 by Dr. Johna Sheriff. She comes in with 5.6 lbs weight gain since her last visit when we removed 1 mL due to dysphagia. She has since had no difficulty swallowing and is able to eat healthy foods. She is, however, hungry about 2 hours after a meal. She would like some fluid replaced to get her back on track but without the same degree of restriction as before.  VITAL SIGNS: Filed Vitals:   03/19/14 1355  BP: 124/82  Pulse: 77  Temp: 97.5 F (36.4 C)    PHYSICAL EXAM: Physical exam reveals a very well-appearing 38 y.o.female in no apparent distress Neurologic: Awake, alert, oriented Psych: Bright affect, conversant Respiratory: Breathing even and unlabored. No stridor or wheezing Abdomen: Soft, nontender, nondistended to palpation. Incisions well-healed. No incisional hernias. Port easily palpated. Extremities: Atraumatic, good range of motion.  ASSESMENT: 38 y.o.  female  s/p AP-Standard lap-band.   PLAN: The patient's port was accessed with a 20G Huber needle without difficulty. Clear fluid was aspirated and 0.75 mL saline was added to the port to give a total predicted volume of 6.65 mL. The patient was able to swallow water without difficulty following the procedure and was instructed to take clear liquids for the next 24-48 hours and advance slowly as tolerated.

## 2014-03-30 ENCOUNTER — Encounter: Payer: Managed Care, Other (non HMO) | Admitting: Gynecology

## 2014-04-02 ENCOUNTER — Ambulatory Visit (INDEPENDENT_AMBULATORY_CARE_PROVIDER_SITE_OTHER): Payer: Managed Care, Other (non HMO) | Admitting: Gynecology

## 2014-04-02 ENCOUNTER — Other Ambulatory Visit (HOSPITAL_COMMUNITY)
Admission: RE | Admit: 2014-04-02 | Discharge: 2014-04-02 | Disposition: A | Discharge status: Home / Self Care | Payer: Managed Care, Other (non HMO) | Source: Ambulatory Visit | Attending: Gynecology | Admitting: Gynecology

## 2014-04-02 ENCOUNTER — Encounter: Payer: Self-pay | Admitting: Gynecology

## 2014-04-02 VITALS — BP 116/74 | Ht 62.0 in | Wt 287.6 lb

## 2014-04-02 DIAGNOSIS — Z1151 Encounter for screening for human papillomavirus (HPV): Secondary | ICD-10-CM | POA: Insufficient documentation

## 2014-04-02 DIAGNOSIS — Z01419 Encounter for gynecological examination (general) (routine) without abnormal findings: Secondary | ICD-10-CM

## 2014-04-02 DIAGNOSIS — Z87410 Personal history of cervical dysplasia: Secondary | ICD-10-CM

## 2014-04-02 NOTE — Patient Instructions (Signed)

## 2014-04-02 NOTE — Addendum Note (Signed)
Addended by: Richardson ChiquitoWILKINSON, KARI S on: 04/02/2014 10:05 AM   Modules accepted: Orders

## 2014-04-02 NOTE — Progress Notes (Signed)
Janet Blanchard 04/22/1976 [id number]   History:    38 y.o.  for annual gyn exam who is currently asymptomatic. Patient with chronic yeast infection and is on Diflucan 150 mg q. weekly for 6 months. Patient's also using a probiotic gel twice a week. Patient is morbidly obese who had lap banding in 2013. In 2012 patient had a mammogram as a result of some breast thickening and the result was negative. Patient was seen in May was treated and had a full STD screening which was negative. 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  Patient had a history of decreased bone mineralization in 2007 based on a bone density study as a result of her long-term use of Depo-Provera. Patient now has a Mirena IUD that was placed last year and she has done well.   Past medical history,surgical history, family history and social history were all reviewed and documented in the EPIC chart.  Gynecologic History Patient's last menstrual period was 03/10/2014. Contraception: IUD Last Pap: 2014. Results were: normal Last mammogram: 2012. Results were: normal  Obstetric History OB History  Gravida Para Term Preterm AB SAB TAB Ectopic Multiple Living  0                  ROS: A ROS was performed and pertinent positives and negatives are included in the history.  GENERAL: No fevers or chills. HEENT: No change in vision, no earache, sore throat or sinus congestion. NECK: No pain or stiffness. CARDIOVASCULAR: No chest pain or pressure. No palpitations. PULMONARY: No shortness of breath, cough or wheeze. GASTROINTESTINAL: No abdominal pain, nausea, vomiting or diarrhea, melena or  bright red blood per rectum. GENITOURINARY: No urinary frequency, urgency, hesitancy or dysuria. MUSCULOSKELETAL: No joint or muscle pain, no back pain, no recent trauma. DERMATOLOGIC: No rash, no itching, no lesions. ENDOCRINE: No polyuria, polydipsia, no heat or cold intolerance. No recent change in weight. HEMATOLOGICAL: No anemia or easy bruising or bleeding. NEUROLOGIC: No headache, seizures, numbness, tingling or weakness. PSYCHIATRIC: No depression, no loss of interest in normal activity or change in sleep pattern.     Exam: chaperone present  BP 116/74  Ht 5\' 2"  (1.575 m)  Wt 287 lb 9.6 oz (130.455 kg)  BMI 52.59 kg/m2  LMP 03/10/2014  Body mass index is 52.59 kg/(m^2).  General appearance : Well developed well nourished female. No acute distress HEENT: Neck supple, trachea midline, no carotid bruits, no thyroidmegaly Lungs: Clear to auscultation, no rhonchi or wheezes, or rib retractions  Heart: Regular rate and rhythm, no murmurs or gallops Breast:Examined in sitting and supine position were symmetrical in appearance, no palpable masses or tenderness,  no skin retraction, no nipple inversion, no nipple discharge, no skin discoloration, no axillary or supraclavicular lymphadenopathy Abdomen: no palpable masses or tenderness, no rebound or guarding Extremities: no edema or skin discoloration or tenderness  Pelvic:  Bartholin, Urethra, Skene Glands: Within normal limits             Vagina: No gross lesions or discharge, IUD string seen  Cervix: No gross lesions or discharge  Uterus  anteverted, normal size, shape and consistency, non-tender and mobile  Adnexa  Without masses or tenderness  Anus and perineum  normal   Rectovaginal  normal sphincter tone without palpated masses or tenderness             Hemoccult not indicated     Assessment/Plan:  38 y.o. female for annual exam morbidly obese. Her [place] has been doing her blood work. She  was reminded to do her monthly breast exam. We discussed importance of calcium vitamin D and regular exercise. Pap smear was done today.  Note: This dictation was prepared with  Dragon/digital dictation along withSmart phrase technology. Any transcriptional errors that result from this process are unintentional.   Ok EdwardsFERNANDEZ,Jaquavis Felmlee H MD, 9:40 AM 04/02/2014

## 2014-04-03 LAB — CYTOLOGY - PAP

## 2014-05-13 ENCOUNTER — Ambulatory Visit (INDEPENDENT_AMBULATORY_CARE_PROVIDER_SITE_OTHER): Payer: Managed Care, Other (non HMO) | Admitting: Gynecology

## 2014-05-13 ENCOUNTER — Encounter: Payer: Self-pay | Admitting: Gynecology

## 2014-05-13 VITALS — BP 130/86

## 2014-05-13 DIAGNOSIS — B9689 Other specified bacterial agents as the cause of diseases classified elsewhere: Secondary | ICD-10-CM

## 2014-05-13 DIAGNOSIS — A499 Bacterial infection, unspecified: Secondary | ICD-10-CM

## 2014-05-13 DIAGNOSIS — Z113 Encounter for screening for infections with a predominantly sexual mode of transmission: Secondary | ICD-10-CM

## 2014-05-13 DIAGNOSIS — N9089 Other specified noninflammatory disorders of vulva and perineum: Secondary | ICD-10-CM

## 2014-05-13 DIAGNOSIS — N760 Acute vaginitis: Secondary | ICD-10-CM

## 2014-05-13 DIAGNOSIS — Z23 Encounter for immunization: Secondary | ICD-10-CM

## 2014-05-13 DIAGNOSIS — N898 Other specified noninflammatory disorders of vagina: Secondary | ICD-10-CM

## 2014-05-13 LAB — WET PREP FOR TRICH, YEAST, CLUE
Trich, Wet Prep: NONE SEEN
WBC, Wet Prep HPF POC: NONE SEEN
Yeast Wet Prep HPF POC: NONE SEEN

## 2014-05-13 MED ORDER — NYSTATIN-TRIAMCINOLONE 100000-0.1 UNIT/GM-% EX CREA: 1.0000 "application " | TOPICAL_CREAM | Freq: Two times a day (BID) | CUTANEOUS | Status: DC

## 2014-05-13 MED ORDER — TINIDAZOLE 500 MG PO TABS: ORAL_TABLET | ORAL | Status: DC

## 2014-05-13 NOTE — Patient Instructions (Signed)
Tdap Vaccine (Tetanus, Diphtheria, Pertussis): What You Need to Know 1. Why get vaccinated? Tetanus, diphtheria and pertussis can be very serious diseases, even for adolescents and adults. Tdap vaccine can protect us from these diseases. TETANUS (Lockjaw) causes painful muscle tightening and stiffness, usually all over the body.  It can lead to tightening of muscles in the head and neck so you can't open your mouth, swallow, or sometimes even breathe. Tetanus kills about 1 out of 5 people who are infected. DIPHTHERIA can cause a thick coating to form in the back of the throat.  It can lead to breathing problems, paralysis, heart failure, and death. PERTUSSIS (Whooping Cough) causes severe coughing spells, which can cause difficulty breathing, vomiting and disturbed sleep.  It can also lead to weight loss, incontinence, and rib fractures. Up to 2 in 100 adolescents and 5 in 100 adults with pertussis are hospitalized or have complications, which could include pneumonia or death. These diseases are caused by bacteria. Diphtheria and pertussis are spread from person to person through coughing or sneezing. Tetanus enters the body through cuts, scratches, or wounds. Before vaccines, the [place] saw as many as 200,000 cases a year of diphtheria and pertussis, and hundreds of cases of tetanus. Since vaccination began, tetanus and diphtheria have dropped by about 99% and pertussis by about 80%. 2. Tdap vaccine Tdap vaccine can protect adolescents and adults from tetanus, diphtheria, and pertussis. One dose of Tdap is routinely given at age 11 or 12. People who did not get Tdap at that age should get it as soon as possible. Tdap is especially important for health care professionals and anyone having close contact with a baby younger than 12 months. Pregnant women should get a dose of Tdap during every pregnancy, to protect the newborn from pertussis. Infants are most at risk for severe, life-threatening  complications from pertussis. A similar vaccine, called Td, protects from tetanus and diphtheria, but not pertussis. A Td booster should be given every 10 years. Tdap may be given as one of these boosters if you have not already gotten a dose. Tdap may also be given after a severe cut or burn to prevent tetanus infection. Your doctor can give you more information. Tdap may safely be given at the same time as other vaccines. 3. Some people should not get this vaccine  If you ever had a life-threatening allergic reaction after a dose of any tetanus, diphtheria, or pertussis containing vaccine, OR if you have a severe allergy to any part of this vaccine, you should not get Tdap. Tell your doctor if you have any severe allergies.  If you had a coma, or long or multiple seizures within 7 days after a childhood dose of DTP or DTaP, you should not get Tdap, unless a cause other than the vaccine was found. You can still get Td.  Talk to your doctor if you:  have epilepsy or another nervous system problem,  had severe pain or swelling after any vaccine containing diphtheria, tetanus or pertussis,  ever had Guillain-Barr Syndrome (GBS),  aren't feeling well on the day the shot is scheduled. 4. Risks of a vaccine reaction With any medicine, including vaccines, there is a chance of side effects. These are usually mild and go away on their own, but serious reactions are also possible. Brief fainting spells can follow a vaccination, leading to injuries from falling. Sitting or lying down for about 15 minutes can help prevent these. Tell your doctor if you feel   dizzy or light-headed, or have vision changes or ringing in the ears. Mild problems following Tdap (Did not interfere with activities)  Pain where the shot was given (about 3 in 4 adolescents or 2 in 3 adults)  Redness or swelling where the shot was given (about 1 person in 5)  Mild fever of at least 100.23F (up to about 1 in 25 adolescents or  1 in 100 adults)  Headache (about 3 or 4 people in 10)  Tiredness (about 1 person in 3 or 4)  Nausea, vomiting, diarrhea, stomach ache (up to 1 in 4 adolescents or 1 in 10 adults)  Chills, body aches, sore joints, rash, swollen glands (uncommon) Moderate problems following Tdap (Interfered with activities, but did not require medical attention)  Pain where the shot was given (about 1 in 5 adolescents or 1 in 100 adults)  Redness or swelling where the shot was given (up to about 1 in 16 adolescents or 1 in 25 adults)  Fever over 102F (about 1 in 100 adolescents or 1 in 250 adults)  Headache (about 3 in 20 adolescents or 1 in 10 adults)  Nausea, vomiting, diarrhea, stomach ache (up to 1 or 3 people in 100)  Swelling of the entire arm where the shot was given (up to about 3 in 100). Severe problems following Tdap (Unable to perform usual activities; required medical attention)  Swelling, severe pain, bleeding and redness in the arm where the shot was given (rare). A severe allergic reaction could occur after any vaccine (estimated less than 1 in a million doses). 5. What if there is a serious reaction? What should I look for?  Look for anything that concerns you, such as signs of a severe allergic reaction, very high fever, or behavior changes. Signs of a severe allergic reaction can include hives, swelling of the face and throat, difficulty breathing, a fast heartbeat, dizziness, and weakness. These would start a few minutes to a few hours after the vaccination. What should I do?  If you think it is a severe allergic reaction or other emergency that can't wait, call 9-1-1 or get the person to the nearest hospital. Otherwise, call your doctor.  Afterward, the reaction should be reported to the "Vaccine Adverse Event Reporting System" (VAERS). Your doctor might file this report, or you can do it yourself through the VAERS web site at www.vaers.LAgents.no, or by calling  1-(414) 505-7844. VAERS is only for reporting reactions. They do not give medical advice.  6. The National Vaccine Injury Compensation Program The [employer] Vaccine Injury Compensation Program (VICP) is a federal program that was created to compensate people who may have been injured by certain vaccines. Persons who believe they may have been injured by a vaccine can learn about the program and about filing a claim by calling 1-252-260-4983 or visiting the VICP website at SpiritualWord.at. 7. How can I learn more?  Ask your doctor.  Call your local or state health department.  Contact the Centers for Disease Control and Prevention (CDC):  Call 305-407-4360 or visit CDC's website at PicCapture.uy. CDC Tdap Vaccine VIS (02/15/12) Document Released: 03/26/2012 Document Revised: 02/09/2014 Document Reviewed: 01/07/2014 ExitCare Patient Information 2015 [place], [place]. This information is not intended to replace advice given to you by your health care provider. Make sure you discuss any questions you have with your health care provider. Bacterial Vaginosis Bacterial vaginosis is a vaginal infection that occurs when the normal balance of bacteria in the vagina is disrupted. It results from an  overgrowth of certain bacteria. This is the most common vaginal infection in women of childbearing age. Treatment is important to prevent complications, especially in pregnant women, as it can cause a premature delivery. CAUSES  Bacterial vaginosis is caused by an increase in harmful bacteria that are normally present in smaller amounts in the vagina. Several different kinds of bacteria can cause bacterial vaginosis. However, the reason that the condition develops is not fully understood. RISK FACTORS Certain activities or behaviors can put you at an increased risk of developing bacterial vaginosis, including:  Having a new sex partner or multiple sex partners.  Douching.  Using an  intrauterine device (IUD) for contraception. Women do not get bacterial vaginosis from toilet seats, bedding, swimming pools, or contact with objects around them. SIGNS AND SYMPTOMS  Some women with bacterial vaginosis have no signs or symptoms. Common symptoms include:  Grey vaginal discharge.  A fishlike odor with discharge, especially after sexual intercourse.  Itching or burning of the vagina and vulva.  Burning or pain with urination. DIAGNOSIS  Your health care provider will take a medical history and examine the vagina for signs of bacterial vaginosis. A sample of vaginal fluid may be taken. Your health care provider will look at this sample under a microscope to check for bacteria and abnormal cells. A vaginal pH test may also be done.  TREATMENT  Bacterial vaginosis may be treated with antibiotic medicines. These may be given in the form of a pill or a vaginal cream. A second round of antibiotics may be prescribed if the condition comes back after treatment.  HOME CARE INSTRUCTIONS   Only take over-the-counter or prescription medicines as directed by your health care provider.  If antibiotic medicine was prescribed, take it as directed. Make sure you finish it even if you start to feel better.  Do not have sex until treatment is completed.  Tell all sexual partners that you have a vaginal infection. They should see their health care provider and be treated if they have problems, such as a mild rash or itching.  Practice safe sex by using condoms and only having one sex partner. SEEK MEDICAL CARE IF:   Your symptoms are not improving after 3 days of treatment.  You have increased discharge or pain.  You have a fever. MAKE SURE YOU:   Understand these instructions.  Will watch your condition.  Will get help right away if you are not doing well or get worse. FOR MORE INFORMATION  Centers for Disease Control and Prevention, Division of STD Prevention:  SolutionApps.co.zawww.cdc.gov/std American Sexual Health Association (ASHA): www.ashastd.org  Document Released: 09/25/2005 Document Revised: 07/16/2013 Document Reviewed: 05/07/2013 [facility] Patient Information 2015 [place]. This information is not intended to replace advice given to you by your health care provider. Make sure you discuss any questions you have with your health care provider.

## 2014-05-13 NOTE — Addendum Note (Signed)
Addended by: Berna SpareASTILLO, Macaiah Mangal A on: 05/13/2014 10:27 AM   Modules accepted: Orders

## 2014-05-13 NOTE — Progress Notes (Signed)
   Patient is a 38 year old who presented to the office today stating that when she was sexually insulin with her partner this past weekend she had a labial tear from the oral sex and had bleeding but did not seek medical attention until today. She has been with her new partner for 6 weeks. She has a Mirena IUD for contraception. She had a slight discharge reported.  Exam: External genitalia a small paper cut like area was noted superior portion of the labia majora no other lesions were noted externally or in the vagina or cervix.  A small gray white discharge was noted in the vagina and a GC and chlamydia culture was obtained as well as wet prep.  Wet prep results: Moderate clue cells, too numerous to count bacteria, positive Amine  Assessment/plan: #1 small superficial labial tear almost completely healed. Patient will be prescribed Mytex cream to apply to 3 times a day for the next 3-5 days when necessary. To cover for tetanus we will give her the Tdap vaccine today. #2 for bacterial vaginosis she will be placed on Tindamax 500 mg 4 tablets today and repeat in 24 hours. #3 GC and chlamydia culture pending at time of this dictation.

## 2014-05-14 LAB — GC/CHLAMYDIA PROBE AMP
CT Probe RNA: NEGATIVE
GC Probe RNA: NEGATIVE

## 2014-06-18 ENCOUNTER — Encounter (INDEPENDENT_AMBULATORY_CARE_PROVIDER_SITE_OTHER): Payer: Managed Care, Other (non HMO)

## 2014-09-05 ENCOUNTER — Other Ambulatory Visit: Payer: Self-pay | Admitting: Gynecology

## 2014-09-20 ENCOUNTER — Emergency Department (HOSPITAL_COMMUNITY): Payer: Managed Care, Other (non HMO) | Admitting: Certified Registered"

## 2014-09-20 ENCOUNTER — Emergency Department (HOSPITAL_COMMUNITY): Payer: Managed Care, Other (non HMO)

## 2014-09-20 ENCOUNTER — Encounter (HOSPITAL_COMMUNITY): Payer: Self-pay

## 2014-09-20 ENCOUNTER — Inpatient Hospital Stay (HOSPITAL_COMMUNITY)
Admission: EM | Admit: 2014-09-20 | Discharge: 2014-09-21 | DRG: 493 | Disposition: A | Discharge status: Home / Self Care | Payer: Managed Care, Other (non HMO) | Attending: Orthopedic Surgery | Admitting: Orthopedic Surgery

## 2014-09-20 ENCOUNTER — Encounter (HOSPITAL_COMMUNITY)
Admission: EM | Disposition: A | Discharge status: Home / Self Care | Payer: Self-pay | Source: Home / Self Care | Attending: Orthopedic Surgery

## 2014-09-20 DIAGNOSIS — S9306XA Dislocation of unspecified ankle joint, initial encounter: Secondary | ICD-10-CM | POA: Diagnosis present

## 2014-09-20 DIAGNOSIS — S92901B Unspecified fracture of right foot, initial encounter for open fracture: Secondary | ICD-10-CM | POA: Diagnosis present

## 2014-09-20 DIAGNOSIS — Y92411 Interstate highway as the place of occurrence of the external cause: Secondary | ICD-10-CM

## 2014-09-20 DIAGNOSIS — K589 Irritable bowel syndrome without diarrhea: Secondary | ICD-10-CM | POA: Diagnosis present

## 2014-09-20 DIAGNOSIS — K449 Diaphragmatic hernia without obstruction or gangrene: Secondary | ICD-10-CM | POA: Diagnosis present

## 2014-09-20 DIAGNOSIS — G43909 Migraine, unspecified, not intractable, without status migrainosus: Secondary | ICD-10-CM | POA: Diagnosis present

## 2014-09-20 DIAGNOSIS — S9304XA Dislocation of right ankle joint, initial encounter: Secondary | ICD-10-CM | POA: Diagnosis present

## 2014-09-20 DIAGNOSIS — F419 Anxiety disorder, unspecified: Secondary | ICD-10-CM | POA: Diagnosis present

## 2014-09-20 DIAGNOSIS — E669 Obesity, unspecified: Secondary | ICD-10-CM | POA: Diagnosis present

## 2014-09-20 DIAGNOSIS — F329 Major depressive disorder, single episode, unspecified: Secondary | ICD-10-CM | POA: Diagnosis present

## 2014-09-20 DIAGNOSIS — Z6841 Body Mass Index (BMI) 40.0 and over, adult: Secondary | ICD-10-CM | POA: Diagnosis not present

## 2014-09-20 DIAGNOSIS — K219 Gastro-esophageal reflux disease without esophagitis: Secondary | ICD-10-CM | POA: Diagnosis present

## 2014-09-20 DIAGNOSIS — S93306A Unspecified dislocation of unspecified foot, initial encounter: Secondary | ICD-10-CM | POA: Diagnosis present

## 2014-09-20 DIAGNOSIS — M25579 Pain in unspecified ankle and joints of unspecified foot: Secondary | ICD-10-CM

## 2014-09-20 HISTORY — PX: ANKLE RECONSTRUCTION: SHX1151

## 2014-09-20 HISTORY — PX: COMPLEX WOUND CLOSURE: SHX6446

## 2014-09-20 HISTORY — PX: I&D EXTREMITY: SHX5045

## 2014-09-20 LAB — URINALYSIS, ROUTINE W REFLEX MICROSCOPIC
BILIRUBIN URINE: NEGATIVE
GLUCOSE, UA: NEGATIVE mg/dL
HGB URINE DIPSTICK: NEGATIVE
Ketones, ur: NEGATIVE mg/dL
Leukocytes, UA: NEGATIVE
Nitrite: NEGATIVE
Protein, ur: NEGATIVE mg/dL
Specific Gravity, Urine: 1.024 (ref 1.005–1.030)
Urobilinogen, UA: 1 mg/dL (ref 0.0–1.0)
pH: 7 (ref 5.0–8.0)

## 2014-09-20 LAB — COMPREHENSIVE METABOLIC PANEL
ALK PHOS: 72 U/L (ref 39–117)
ALT: 11 U/L (ref 0–35)
AST: 16 U/L (ref 0–37)
Albumin: 3.6 g/dL (ref 3.5–5.2)
Anion gap: 11 (ref 5–15)
BUN: 10 mg/dL (ref 6–23)
CALCIUM: 8.7 mg/dL (ref 8.4–10.5)
CO2: 22 meq/L (ref 19–32)
Chloride: 103 mEq/L (ref 96–112)
Creatinine, Ser: 0.81 mg/dL (ref 0.50–1.10)
GFR calc Af Amer: >90 mL/min (ref >90)
GFR calc non Af Amer: >90 mL/min (ref >90)
Glucose, Bld: 115 mg/dL — ABNORMAL HIGH (ref 70–99)
POTASSIUM: 4.1 meq/L (ref 3.7–5.3)
SODIUM: 136 meq/L — AB (ref 137–147)
TOTAL PROTEIN: 6.8 g/dL (ref 6.0–8.3)
Total Bilirubin: 0.2 mg/dL — ABNORMAL LOW (ref 0.3–1.2)

## 2014-09-20 LAB — CBC WITH DIFFERENTIAL/PLATELET
Basophils Absolute: 0 10*3/uL (ref 0.0–0.1)
Basophils Relative: 0 % (ref 0–1)
EOS ABS: 0 10*3/uL (ref 0.0–0.7)
Eosinophils Relative: 0 % (ref 0–5)
HCT: 39.4 % (ref 36.0–46.0)
HEMOGLOBIN: 12.9 g/dL (ref 12.0–15.0)
LYMPHS ABS: 1.2 10*3/uL (ref 0.7–4.0)
Lymphocytes Relative: 10 % — ABNORMAL LOW (ref 12–46)
MCH: 28 pg (ref 26.0–34.0)
MCHC: 32.7 g/dL (ref 30.0–36.0)
MCV: 85.5 fL (ref 78.0–100.0)
MONO ABS: 0.7 10*3/uL (ref 0.1–1.0)
MONOS PCT: 5 % (ref 3–12)
Neutro Abs: 10.1 10*3/uL — ABNORMAL HIGH (ref 1.7–7.7)
Neutrophils Relative %: 85 % — ABNORMAL HIGH (ref 43–77)
Platelets: 275 10*3/uL (ref 150–400)
RBC: 4.61 MIL/uL (ref 3.87–5.11)
RDW: 12.8 % (ref 11.5–15.5)
WBC: 12.1 10*3/uL — ABNORMAL HIGH (ref 4.0–10.5)

## 2014-09-20 LAB — POC URINE PREG, ED: PREG TEST UR: NEGATIVE

## 2014-09-20 SURGERY — IRRIGATION AND DEBRIDEMENT EXTREMITY
Anesthesia: General | Site: Ankle | Laterality: Right

## 2014-09-20 MED ORDER — LIDOCAINE HCL (CARDIAC) 20 MG/ML IV SOLN
INTRAVENOUS | Status: DC | PRN
  Administered 2014-09-20: 40 mg via INTRAVENOUS

## 2014-09-20 MED ORDER — ALPRAZOLAM 0.5 MG PO TABS: 0.5000 mg | ORAL_TABLET | Freq: Every evening | ORAL | Status: DC | PRN

## 2014-09-20 MED ORDER — OXYCODONE HCL 5 MG PO TABS: 5.0000 mg | ORAL_TABLET | Freq: Once | ORAL | Status: DC | PRN

## 2014-09-20 MED ORDER — FENTANYL CITRATE 0.05 MG/ML IJ SOLN
INTRAMUSCULAR | Status: DC | PRN
  Administered 2014-09-20 (×2): 50 ug via INTRAVENOUS

## 2014-09-20 MED ORDER — ENOXAPARIN SODIUM 40 MG/0.4ML ~~LOC~~ SOLN: 40.0000 mg | SUBCUTANEOUS | Status: DC

## 2014-09-20 MED ORDER — PROPOFOL 10 MG/ML IV BOLUS
INTRAVENOUS | Status: AC
  Filled 2014-09-20: qty 20

## 2014-09-20 MED ORDER — CITALOPRAM HYDROBROMIDE 40 MG PO TABS
40.0000 mg | ORAL_TABLET | Freq: Every day | ORAL | Status: DC
  Filled 2014-09-20: qty 1

## 2014-09-20 MED ORDER — FENTANYL CITRATE 0.05 MG/ML IJ SOLN
100.0000 ug | INTRAMUSCULAR | Status: DC | PRN
  Administered 2014-09-20 (×2): 100 ug via INTRAVENOUS
  Filled 2014-09-20 (×2): qty 2

## 2014-09-20 MED ORDER — CEFAZOLIN SODIUM-DEXTROSE 2-3 GM-% IV SOLR
2.0000 g | Freq: Three times a day (TID) | INTRAVENOUS | Status: DC
  Administered 2014-09-20 (×2): 2 g via INTRAVENOUS
  Filled 2014-09-20 (×4): qty 50

## 2014-09-20 MED ORDER — MORPHINE SULFATE 2 MG/ML IJ SOLN
1.0000 mg | INTRAMUSCULAR | Status: DC | PRN
  Administered 2014-09-21 (×2): 1 mg via INTRAVENOUS
  Filled 2014-09-20 (×2): qty 1

## 2014-09-20 MED ORDER — DOCUSATE SODIUM 100 MG PO CAPS: 100.0000 mg | ORAL_CAPSULE | Freq: Two times a day (BID) | ORAL | Status: DC

## 2014-09-20 MED ORDER — MIDAZOLAM HCL 5 MG/5ML IJ SOLN
INTRAMUSCULAR | Status: DC | PRN
  Administered 2014-09-20: 2 mg via INTRAVENOUS

## 2014-09-20 MED ORDER — HYDROMORPHONE HCL 1 MG/ML IJ SOLN
INTRAMUSCULAR | Status: AC
  Filled 2014-09-20: qty 1

## 2014-09-20 MED ORDER — SODIUM CHLORIDE 0.9 % IR SOLN
Status: DC | PRN
  Administered 2014-09-20: 3000 mL
  Administered 2014-09-20: 1000 mL

## 2014-09-20 MED ORDER — ONDANSETRON HCL 4 MG/2ML IJ SOLN
4.0000 mg | Freq: Four times a day (QID) | INTRAMUSCULAR | Status: DC | PRN
Start: 2014-09-20 — End: 2014-09-21
  Administered 2014-09-21: 4 mg via INTRAVENOUS
  Filled 2014-09-20: qty 2

## 2014-09-20 MED ORDER — ZONISAMIDE 50 MG PO CAPS: 50.0000 mg | ORAL_CAPSULE | Freq: Every day | ORAL | Status: DC

## 2014-09-20 MED ORDER — PANTOPRAZOLE SODIUM 40 MG PO TBEC
40.0000 mg | DELAYED_RELEASE_TABLET | Freq: Every day | ORAL | Status: DC
  Administered 2014-09-21: 40 mg via ORAL
  Filled 2014-09-20: qty 1

## 2014-09-20 MED ORDER — SUCCINYLCHOLINE CHLORIDE 20 MG/ML IJ SOLN
INTRAMUSCULAR | Status: DC | PRN
  Administered 2014-09-20: 120 mg via INTRAVENOUS

## 2014-09-20 MED ORDER — ZONISAMIDE 25 MG PO CAPS
250.0000 mg | ORAL_CAPSULE | Freq: Every day | ORAL | Status: DC
  Administered 2014-09-21: 250 mg via ORAL
  Filled 2014-09-20 (×2): qty 2

## 2014-09-20 MED ORDER — CEFAZOLIN SODIUM-DEXTROSE 2-3 GM-% IV SOLR
2.0000 g | Freq: Four times a day (QID) | INTRAVENOUS | Status: AC
  Administered 2014-09-21 (×3): 2 g via INTRAVENOUS
  Filled 2014-09-20 (×3): qty 50

## 2014-09-20 MED ORDER — LIDOCAINE HCL (CARDIAC) 20 MG/ML IV SOLN
INTRAVENOUS | Status: AC
  Filled 2014-09-20: qty 5

## 2014-09-20 MED ORDER — MIDAZOLAM HCL 2 MG/2ML IJ SOLN
INTRAMUSCULAR | Status: AC
  Filled 2014-09-20: qty 2

## 2014-09-20 MED ORDER — SUCCINYLCHOLINE CHLORIDE 20 MG/ML IJ SOLN
INTRAMUSCULAR | Status: AC
  Filled 2014-09-20: qty 1

## 2014-09-20 MED ORDER — METHOCARBAMOL 500 MG PO TABS
500.0000 mg | ORAL_TABLET | Freq: Four times a day (QID) | ORAL | Status: DC | PRN
  Administered 2014-09-21 (×2): 500 mg via ORAL
  Filled 2014-09-20 (×3): qty 1

## 2014-09-20 MED ORDER — OXYCODONE HCL 5 MG/5ML PO SOLN: 5.0000 mg | Freq: Once | ORAL | Status: DC | PRN

## 2014-09-20 MED ORDER — METHOCARBAMOL 1000 MG/10ML IJ SOLN
500.0000 mg | Freq: Four times a day (QID) | INTRAMUSCULAR | Status: DC | PRN
  Administered 2014-09-20: 500 mg via INTRAVENOUS
  Filled 2014-09-20 (×2): qty 5

## 2014-09-20 MED ORDER — SODIUM CHLORIDE 0.9 % IV BOLUS (SEPSIS)
500.0000 mL | Freq: Once | INTRAVENOUS | Status: AC
  Administered 2014-09-20: 500 mL via INTRAVENOUS

## 2014-09-20 MED ORDER — IOHEXOL 300 MG/ML  SOLN
100.0000 mL | Freq: Once | INTRAMUSCULAR | Status: AC | PRN
  Administered 2014-09-20: 100 mL via INTRAVENOUS

## 2014-09-20 MED ORDER — OXYCODONE-ACETAMINOPHEN 5-325 MG PO TABS: 2.0000 | ORAL_TABLET | ORAL | Status: DC | PRN

## 2014-09-20 MED ORDER — PHENYLEPHRINE HCL 10 MG/ML IJ SOLN
INTRAMUSCULAR | Status: DC | PRN
  Administered 2014-09-20: 40 ug via INTRAVENOUS

## 2014-09-20 MED ORDER — FENTANYL CITRATE 0.05 MG/ML IJ SOLN
INTRAMUSCULAR | Status: AC
  Filled 2014-09-20: qty 5

## 2014-09-20 MED ORDER — LACTATED RINGERS IV SOLN
INTRAVENOUS | Status: DC | PRN
  Administered 2014-09-20 (×2): via INTRAVENOUS

## 2014-09-20 MED ORDER — OXYCODONE-ACETAMINOPHEN 5-325 MG PO TABS
1.0000 | ORAL_TABLET | ORAL | Status: DC | PRN
  Administered 2014-09-21 (×2): 2 via ORAL
  Filled 2014-09-20 (×2): qty 2

## 2014-09-20 MED ORDER — PROPOFOL 10 MG/ML IV BOLUS
0.5000 mg/kg | Freq: Once | INTRAVENOUS | Status: AC
  Administered 2014-09-20: 50 mg via INTRAVENOUS

## 2014-09-20 MED ORDER — ONDANSETRON HCL 4 MG PO TABS: 4.0000 mg | ORAL_TABLET | Freq: Three times a day (TID) | ORAL | Status: DC | PRN

## 2014-09-20 MED ORDER — KETAMINE HCL 10 MG/ML IJ SOLN
0.5000 mg/kg | Freq: Once | INTRAMUSCULAR | Status: AC
  Administered 2014-09-20: 50 mg via INTRAVENOUS
  Filled 2014-09-20: qty 6.8

## 2014-09-20 MED ORDER — METOCLOPRAMIDE HCL 10 MG PO TABS: 5.0000 mg | ORAL_TABLET | Freq: Three times a day (TID) | ORAL | Status: DC | PRN

## 2014-09-20 MED ORDER — METOCLOPRAMIDE HCL 5 MG/ML IJ SOLN: 5.0000 mg | Freq: Three times a day (TID) | INTRAMUSCULAR | Status: DC | PRN

## 2014-09-20 MED ORDER — ENOXAPARIN SODIUM 40 MG/0.4ML ~~LOC~~ SOLN
40.0000 mg | Freq: Every day | SUBCUTANEOUS | Status: DC
  Administered 2014-09-21: 40 mg via SUBCUTANEOUS
  Filled 2014-09-20: qty 0.4

## 2014-09-20 MED ORDER — PROPOFOL 10 MG/ML IV BOLUS
INTRAVENOUS | Status: DC | PRN
  Administered 2014-09-20: 150 mg via INTRAVENOUS
  Administered 2014-09-20: 50 mg via INTRAVENOUS

## 2014-09-20 MED ORDER — HYDROMORPHONE HCL 1 MG/ML IJ SOLN
0.2500 mg | INTRAMUSCULAR | Status: DC | PRN
  Administered 2014-09-20 (×4): 0.5 mg via INTRAVENOUS

## 2014-09-20 MED ORDER — ONDANSETRON HCL 4 MG/2ML IJ SOLN
INTRAMUSCULAR | Status: DC | PRN
  Administered 2014-09-20: 4 mg via INTRAVENOUS

## 2014-09-20 MED ORDER — ONDANSETRON HCL 4 MG/2ML IJ SOLN
4.0000 mg | Freq: Once | INTRAMUSCULAR | Status: AC
  Administered 2014-09-20: 4 mg via INTRAVENOUS
  Filled 2014-09-20: qty 2

## 2014-09-20 MED ORDER — ONDANSETRON HCL 4 MG PO TABS: 4.0000 mg | ORAL_TABLET | Freq: Four times a day (QID) | ORAL | Status: DC | PRN

## 2014-09-20 SURGICAL SUPPLY — 56 items
BANDAGE ELASTIC 4 VELCRO ST LF (GAUZE/BANDAGES/DRESSINGS) ×3 IMPLANT
BANDAGE ELASTIC 6 VELCRO ST LF (GAUZE/BANDAGES/DRESSINGS) ×3 IMPLANT
BNDG COHESIVE 4X5 TAN STRL (GAUZE/BANDAGES/DRESSINGS) ×3 IMPLANT
BNDG GAUZE ELAST 4 BULKY (GAUZE/BANDAGES/DRESSINGS) ×3 IMPLANT
COVER SURGICAL LIGHT HANDLE (MISCELLANEOUS) ×3 IMPLANT
DRSG ADAPTIC 3X8 NADH LF (GAUZE/BANDAGES/DRESSINGS) ×2 IMPLANT
ELECT REM PT RETURN 9FT ADLT (ELECTROSURGICAL) ×3
ELECTRODE REM PT RTRN 9FT ADLT (ELECTROSURGICAL) ×1 IMPLANT
EVACUATOR 1/8 PVC DRAIN (DRAIN) IMPLANT
GAUZE SPONGE 4X4 12PLY STRL (GAUZE/BANDAGES/DRESSINGS) ×3 IMPLANT
GAUZE XEROFORM 1X8 LF (GAUZE/BANDAGES/DRESSINGS) ×3 IMPLANT
GLOVE BIO SURGEON STRL SZ7.5 (GLOVE) ×2 IMPLANT
GLOVE BIO SURGEON STRL SZ8 (GLOVE) ×5 IMPLANT
GLOVE BIOGEL PI IND STRL 7.0 (GLOVE) ×2 IMPLANT
GLOVE BIOGEL PI IND STRL 8 (GLOVE) ×2 IMPLANT
GLOVE BIOGEL PI INDICATOR 7.0 (GLOVE) ×2
GLOVE BIOGEL PI INDICATOR 8 (GLOVE) ×2
GLOVE ORTHO TXT STRL SZ7.5 (GLOVE) ×4 IMPLANT
GOWN STRL REUS W/ TWL LRG LVL3 (GOWN DISPOSABLE) ×4 IMPLANT
GOWN STRL REUS W/TWL 2XL LVL3 (GOWN DISPOSABLE) ×5 IMPLANT
GOWN STRL REUS W/TWL LRG LVL3 (GOWN DISPOSABLE) ×3
HANDPIECE INTERPULSE COAX TIP (DISPOSABLE)
IV NS IRRIG 3000ML ARTHROMATIC (IV SOLUTION) ×2 IMPLANT
KIT BASIN OR (CUSTOM PROCEDURE TRAY) ×3 IMPLANT
KIT ROOM TURNOVER OR (KITS) ×3 IMPLANT
MANIFOLD NEPTUNE II (INSTRUMENTS) ×3 IMPLANT
NDL 1/2 CIR MAYO (NEEDLE) IMPLANT
NEEDLE 1/2 CIR MAYO (NEEDLE) ×3 IMPLANT
NS IRRIG 1000ML POUR BTL (IV SOLUTION) ×3 IMPLANT
PACK ORTHO EXTREMITY (CUSTOM PROCEDURE TRAY) ×3 IMPLANT
PAD ABD 8X10 STRL (GAUZE/BANDAGES/DRESSINGS) ×2 IMPLANT
PAD ARMBOARD 7.5X6 YLW CONV (MISCELLANEOUS) ×6 IMPLANT
PAD CAST 4YDX4 CTTN HI CHSV (CAST SUPPLIES) ×1 IMPLANT
PADDING CAST COTTON 4X4 STRL (CAST SUPPLIES) ×3
PENCIL BUTTON HOLSTER BLD 10FT (ELECTRODE) IMPLANT
RETRIEVER SUT HEWSON (MISCELLANEOUS) ×2 IMPLANT
SET HNDPC FAN SPRY TIP SCT (DISPOSABLE) IMPLANT
SPLINT PLASTER CAST XFAST 5X30 (CAST SUPPLIES) ×1 IMPLANT
SPLINT PLASTER XFAST SET 5X30 (CAST SUPPLIES) ×1
SPONGE GAUZE 4X4 12PLY STER LF (GAUZE/BANDAGES/DRESSINGS) ×2 IMPLANT
SPONGE LAP 18X18 X RAY DECT (DISPOSABLE) ×3 IMPLANT
STOCKINETTE IMPERVIOUS 9X36 MD (GAUZE/BANDAGES/DRESSINGS) ×1 IMPLANT
SUCTION FRAZIER TIP 10 FR DISP (SUCTIONS) ×2 IMPLANT
SUT ETHILON 3 0 PS 1 (SUTURE) ×2 IMPLANT
SUT FIBERWIRE #2 38 REV NDL BL (SUTURE) ×6
SUT VIC AB 2-0 CT1 27 (SUTURE) ×3
SUT VIC AB 2-0 CT1 TAPERPNT 27 (SUTURE) ×1 IMPLANT
SUTURE FIBERWR#2 38 REV NDL BL (SUTURE) ×2 IMPLANT
TOWEL OR 17X24 6PK STRL BLUE (TOWEL DISPOSABLE) ×3 IMPLANT
TOWEL OR 17X26 10 PK STRL BLUE (TOWEL DISPOSABLE) ×3 IMPLANT
TOWEL OR NON WOVEN STRL DISP B (DISPOSABLE) ×1 IMPLANT
TUBE ANAEROBIC SPECIMEN COL (MISCELLANEOUS) IMPLANT
TUBE CONNECTING 12X1/4 (SUCTIONS) ×3 IMPLANT
UNDERPAD 30X30 INCONTINENT (UNDERPADS AND DIAPERS) ×3 IMPLANT
WATER STERILE IRR 1000ML POUR (IV SOLUTION) ×1 IMPLANT
YANKAUER SUCT BULB TIP NO VENT (SUCTIONS) ×3 IMPLANT

## 2014-09-20 NOTE — Op Note (Signed)
09/20/2014  9:58 PM  PATIENT:  Janet Blanchard    PRE-OPERATIVE DIAGNOSIS:  fractured right foot  POST-OPERATIVE DIAGNOSIS:  Same  PROCEDURE:  IRRIGATION AND DEBRIDEMENT OF OPEN WOUND  RIGHT FOOT AND TRAUMATIC ARTHROTOMY, LATERAL RECONSTRUCTION ANKLE RIGHT  SURGEON:  MURPHY, TIMOTHY, D, MD  ASSISTANT: Janace LittenBrandon Parry, OPA, He was necessary for efficiency and safety of the case.   ANESTHESIA:   gen  PREOPERATIVE INDICATIONS:  Janet Blanchard is a  38 y.o. female with a diagnosis of fractured right foot who failed conservative measures and elected for surgical management.    The risks benefits and alternatives were discussed with the patient preoperatively including but not limited to the risks of infection, bleeding, nerve injury, cardiopulmonary complications, the need for revision surgery, among others, and the patient was willing to proceed.  OPERATIVE IMPLANTS: none  OPERATIVE FINDINGS: very unstable ankle joint and subtalar  BLOOD LOSS: min  COMPLICATIONS: none  TOURNIQUET TIME: none  OPERATIVE PROCEDURE:  Patient was identified in the preoperative holding area and site was marked by me She was transported to the operating theater and placed on the table in supine position taking care to pad all bony prominences. After a preincinduction time out anesthesia was induced. The right lower extremity was prepped and draped in normal sterile fashion and a pre-incision timeout was performed. She received ancef for preoperative antibiotics.   I first performed a thorough irrigation of her open wound which measured 7 cm. I debrided some devitalized fat. There was no gross contamination. I then performed an irrigation with 3 L of saline. I was able to see into the ankle joint as well as a palpable step-off of her subtalar joint with distraction.  I took a fluoroscopic examination of the ankle and the subtalar joint was relatively stable after being reduced the ankle itself was extremely  unstable with minimal traction and inversion leading to 45 of tilt of the tibiotalar joint.  I explored the wound the sural nerve was not visible due to her anatomic landmarks was likely posterior and inferior to our traumatic wound.  I examined the TFL which was completely ruptured there was a bald spot on the inferior anterior fibula and the joint capsule was also ruptured talus was visible through the wound.  This point I felt that her subtalar joint was stable and would be fine with casting felt that her ankle joint would require some stabilization side elected her to perform a repair of her a I TFL and lateral ankle reconstruction as well as wound capsule leisure. I used a 2-0 Vicryl to close her joint capsule. I then used a #2 FiberWire to whipstitch her a I TFL which had been avulsed from the fibula. I created 2 drill tunnels taking care to protect the peroneal tendons and don't these into the fibula I secured them over the top out the lateral side of the fibula and was very happy with the apposition of the repaired a I TFL. I then thoroughly irrigated the wound again.  I closed the lateral aspect of the iliac TFL to the periosteum of the fibula over top of our bone tunnel repair to back this up and was very happy with the stability of the ankle I then thoroughly irrigated the wound again and performed a complex closure of her skin wound leaving it somewhat loose if it so that it can drain if need be.  Sterile dressing was then applied she was placed in a short leg  splint. It was molded and eversion.    POST OPERATIVE PLAN: NWB. Lovenox and SCD's    This note was generated using a template and dragon dictation system. In light of that, I have reviewed the note and all aspects of it are applicable to this case. Any dictation errors are due to the computerized dictation system.
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
## 2014-09-20 NOTE — ED Notes (Signed)
PT monitored by pulse ox, bp cuff, and 5-lead. 

## 2014-09-20 NOTE — ED Notes (Signed)
Per Dr Effie ShyWentz mix 10 ml Ketamine and 10 ml Propofol in 20 ml syringe.  Gave total of 10 ml.  Wasted other 10 ml in sink.  Cassie RN witnessed.  Did waste in pyxis for Propofol of 150 mg.
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
## 2014-09-20 NOTE — Anesthesia Postprocedure Evaluation (Signed)
  Anesthesia Post-op Note  Patient: Janet Blanchard  Procedure(s) Performed: Procedure(s): IRRIGATION AND DEBRIDEMENT OF OPEN WOUND  RIGHT FOOT AND TRAUMATIC ARTHROTOMY (Right) LATERAL RECONSTRUCTION ANKLE RIGHT (Right) 7 CM COMPLEX WOUND CLOSURE (Right)  Patient Location: PACU  Anesthesia Type:General  Level of Consciousness: awake and alert   Airway and Oxygen Therapy: Patient Spontanous Breathing  Post-op Pain: moderate  Post-op Assessment: Post-op Vital signs reviewed, Patient's Cardiovascular Status Stable and Respiratory Function Stable  Post-op Vital Signs: Reviewed  Filed Vitals:   09/20/14 2248  BP: 113/67  Pulse: 92  Temp:   Resp: 17    Complications: No apparent anesthesia complications

## 2014-09-20 NOTE — Progress Notes (Signed)
Orthopedic Tech Progress Note Patient Details:  Janet Blanchard Jan 30, 1976 [id number]  Ortho Devices Type of Ortho Device: Ace wrap, Stirrup splint, Post (short leg) splint Ortho Device/Splint Location: RLE Ortho Device/Splint Interventions: Application, Ordered   Jennye MoccasinHughes, [name] 09/20/2014, 3:42 PM

## 2014-09-20 NOTE — ED Notes (Signed)
Clydie BraunKaren called from OR, send pt to room 36 short stay.  Dr. Eulah PontMurphy will see pt there.
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
## 2014-09-20 NOTE — ED Notes (Signed)
Returned 100 mg in Ketamine vial to pharmacy.

## 2014-09-20 NOTE — Transfer of Care (Signed)
Immediate Anesthesia Transfer of Care Note  Patient: Josefina Doarmen L Pennix  Procedure(s) Performed: Procedure(s): IRRIGATION AND DEBRIDEMENT OF OPEN WOUND  RIGHT FOOT AND TRAUMATIC ARTHROTOMY (Right) LATERAL RECONSTRUCTION ANKLE RIGHT (Right) 7 CM COMPLEX WOUND CLOSURE (Right)  Patient Location: PACU  Anesthesia Type:General  Level of Consciousness: awake, alert  and oriented  Airway & Oxygen Therapy: Patient Spontanous Breathing  Post-op Assessment: Report given to PACU RN and Post -op Vital signs reviewed and stable  Post vital signs: Reviewed and stable  Complications: No apparent anesthesia complications

## 2014-09-20 NOTE — Anesthesia Procedure Notes (Signed)
Procedure Name: Intubation Date/Time: 09/20/2014 8:36 PM Performed by: Arlice ColtMANESS, [name] Pre-anesthesia Checklist: Patient identified, Emergency Drugs available, Suction available, Patient being monitored and Timeout performed Patient Re-evaluated:Patient Re-evaluated prior to inductionOxygen Delivery Method: Circle system utilized Preoxygenation: Pre-oxygenation with 100% oxygen Intubation Type: IV induction and Rapid sequence Laryngoscope Size: Mac and 3 Grade View: Grade I Tube type: Oral Tube size: 7.5 mm Number of attempts: 1 Airway Equipment and Method: Stylet Placement Confirmation: ETT inserted through vocal cords under direct vision,  positive ETCO2 and breath sounds checked- equal and bilateral Secured at: 21 cm Tube secured with: Tape Dental Injury: Teeth and Oropharynx as per pre-operative assessment

## 2014-09-20 NOTE — Anesthesia Preprocedure Evaluation (Addendum)
Anesthesia Evaluation  Patient identified by MRN, date of birth, ID band Patient awake    Reviewed: Allergy & Precautions, H&P , NPO status , Patient's Chart, lab work & pertinent test results  History of Anesthesia Complications Negative for: history of anesthetic complications  Airway Mallampati: I  TM Distance: >3 FB Neck ROM: Full    Dental  (+) Teeth Intact   Pulmonary  breath sounds clear to auscultation        Cardiovascular Rhythm:Regular Rate:Normal     Neuro/Psych  Headaches, Depression    GI/Hepatic hiatal hernia, GERD-  Medicated and Controlled,  Endo/Other  Morbid obesity  Renal/GU      Musculoskeletal  (+) Arthritis -,   Abdominal   Peds  Hematology   Anesthesia Other Findings   Reproductive/Obstetrics                            Anesthesia Physical Anesthesia Plan  ASA: II and emergent  Anesthesia Plan: General   Post-op Pain Management:    Induction: Intravenous  Airway Management Planned: Oral ETT and LMA  Additional Equipment:   Intra-op Plan:   Post-operative Plan: Extubation in OR  Informed Consent: I have reviewed the patients History and Physical, chart, labs and discussed the procedure including the risks, benefits and alternatives for the proposed anesthesia with the patient or authorized representative who has indicated his/her understanding and acceptance.   Dental advisory given  Plan Discussed with: Anesthesiologist and Surgeon  Anesthesia Plan Comments:        Anesthesia Quick Evaluation

## 2014-09-21 ENCOUNTER — Encounter (HOSPITAL_COMMUNITY): Payer: Self-pay | Admitting: Orthopedic Surgery

## 2014-09-21 MED ORDER — HYDROCODONE-ACETAMINOPHEN 5-325 MG PO TABS
2.0000 | ORAL_TABLET | Freq: Once | ORAL | Status: AC
  Administered 2014-09-21: 2 via ORAL
  Filled 2014-09-21: qty 2

## 2014-09-21 MED ORDER — HYDROCODONE-ACETAMINOPHEN 5-325 MG PO TABS: 1.0000 | ORAL_TABLET | ORAL | Status: DC | PRN
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
## 2014-09-21 NOTE — Progress Notes (Signed)
Patient refuses to demonstrate self- injection fpr Lovenox- states has given injections to self before. Lovenox ed. Done.
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
## 2014-09-21 NOTE — Progress Notes (Signed)
Patient ID: Janet Blanchard, female   DOB: 15-Oct-1975, 38 y.o.   MRN: [id number]     Subjective:  Patient reports pain as mild to moderate.  Doing okay and would like to go home  Objective:   VITALS:   Filed Vitals:   09/20/14 2300 09/20/14 2307 09/20/14 2338 09/21/14 0551  BP: 118/72 123/70 100/63 88/72  Pulse: 93 95 89 71  Temp:  98.2 F (36.8 C) 97.6 F (36.4 C) 97.8 F (36.6 C)  TempSrc:   Oral Oral  Resp: 21 18 18 18   Height:      Weight:      SpO2: 100% 100% 93% 97%    ABD soft Sensation intact distally Dorsiflexion/Plantar flexion intact Incision: dressing C/D/I and no drainage  short leg splint in place and functioning   Lab Results  Component Value Date   WBC 12.1* 09/20/2014   HGB 12.9 09/20/2014   HCT 39.4 09/20/2014   MCV 85.5 09/20/2014   PLT 275 09/20/2014     Assessment/Plan: 1 Day Post-Op   Active Problems:   Ankle dislocation   Advance diet Up with therapy  DC home  NWB right lower ext Splint at all times Follow up in one week with Margarita Ranaimothy Murphy MD     Torrie MayersUGLAS Shaydon Lease, Apolinar JunesBRANDON 09/21/2014, 8:51 AM   Margarita Ranaimothy Murphy MD [phone]

## 2014-09-21 NOTE — Discharge Instructions (Signed)
No weight on your Right foot  Keep your splint clean and dry and on until follow up  Elevate your right foot as much as possible
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
## 2015-04-05 ENCOUNTER — Encounter: Payer: Self-pay | Admitting: Internal Medicine

## 2015-04-08 ENCOUNTER — Encounter: Payer: Managed Care, Other (non HMO) | Admitting: Gynecology

## 2015-05-27 ENCOUNTER — Other Ambulatory Visit (HOSPITAL_COMMUNITY)
Admission: RE | Admit: 2015-05-27 | Discharge: 2015-05-27 | Disposition: A | Discharge status: Home / Self Care | Payer: Managed Care, Other (non HMO) | Source: Ambulatory Visit | Attending: Gynecology | Admitting: Gynecology

## 2015-05-27 ENCOUNTER — Encounter: Payer: Self-pay | Admitting: Gynecology

## 2015-05-27 ENCOUNTER — Ambulatory Visit (INDEPENDENT_AMBULATORY_CARE_PROVIDER_SITE_OTHER): Payer: Managed Care, Other (non HMO) | Admitting: Gynecology

## 2015-05-27 VITALS — BP 132/80 | Ht 62.0 in | Wt 293.0 lb

## 2015-05-27 DIAGNOSIS — Z1151 Encounter for screening for human papillomavirus (HPV): Secondary | ICD-10-CM | POA: Insufficient documentation

## 2015-05-27 DIAGNOSIS — R635 Abnormal weight gain: Secondary | ICD-10-CM

## 2015-05-27 DIAGNOSIS — Z01419 Encounter for gynecological examination (general) (routine) without abnormal findings: Secondary | ICD-10-CM

## 2015-05-27 DIAGNOSIS — R232 Flushing: Secondary | ICD-10-CM

## 2015-05-27 DIAGNOSIS — N951 Menopausal and female climacteric states: Secondary | ICD-10-CM | POA: Diagnosis not present

## 2015-05-27 DIAGNOSIS — Z8639 Personal history of other endocrine, nutritional and metabolic disease: Secondary | ICD-10-CM

## 2015-05-27 LAB — TSH: TSH: 1.251 u[IU]/mL (ref 0.350–4.500)

## 2015-05-27 NOTE — Progress Notes (Signed)
Janet Blanchard 08/22/1976 [id number]   History:    39 y.o.  for annual gyn exam with no complaints today. Patient does suffer occasionally from vasomotor symptoms and hot flashes. Patient had a Mirena IUD placed in 2013 and has had normal menstrual cycles. She also has past history vitamin D deficiency. Her PCP has been doing her blood work. 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  Patient had a history of decreased bone mineralization in 2007 based on a bone density study as a result of her long-term use of Depo-Provera. Patient's Pap smear 2012, 2013, 2014 and 2015 have been normal.  Past medical history,surgical history, family history and social history were all reviewed and documented in the EPIC chart.  Gynecologic History No LMP recorded. Patient is not currently having periods (Reason: IUD). Contraception: IUD Last Pap: 2015. Results were: normal Last mammogram: Not indicated. Results were: Not indicated  Obstetric History OB History  Gravida Para Term Preterm AB SAB TAB Ectopic Multiple Living  0 0 0 0 0 0 0 0 0 0          ROS: A ROS was performed and pertinent positives and negatives are included in the history.  GENERAL: No fevers or chills. HEENT: No change in vision, no earache, sore throat or sinus congestion. NECK: No pain or stiffness. CARDIOVASCULAR: No chest pain or pressure. No palpitations. PULMONARY: No shortness of breath, cough or wheeze. GASTROINTESTINAL: No abdominal pain, nausea, vomiting or diarrhea, melena or bright red blood per rectum. GENITOURINARY: No urinary frequency, urgency,  hesitancy or dysuria. MUSCULOSKELETAL: No joint or muscle pain, no back pain, no recent trauma. DERMATOLOGIC: No rash, no itching, no lesions. ENDOCRINE: No polyuria, polydipsia, no heat or cold intolerance. No recent change in weight. HEMATOLOGICAL: No anemia or easy bruising or bleeding. NEUROLOGIC: No headache, seizures, numbness, tingling or weakness. PSYCHIATRIC: No depression, no loss of interest in normal activity or change in sleep pattern.     Exam: chaperone present  BP 132/80 mmHg  Ht 5\' 2"  (1.575 m)  Wt 293 lb (132.904 kg)  BMI 53.58 kg/m2  Body mass index is 53.58 kg/(m^2).  General appearance : Well developed well nourished female. No acute distress HEENT: Eyes: no retinal hemorrhage or exudates,  Neck supple, trachea midline, no carotid bruits, no thyroidmegaly Lungs: Clear to auscultation, no rhonchi or wheezes, or rib retractions  Heart: Regular rate and rhythm, no murmurs or gallops Breast:Examined in sitting and supine position were symmetrical in appearance, no palpable masses or tenderness,  no skin retraction, no nipple inversion, no nipple discharge, no skin discoloration, no axillary or supraclavicular lymphadenopathy Abdomen: no palpable masses or tenderness, no rebound or guarding Extremities: no edema or skin discoloration or tenderness  Pelvic:  Bartholin, Urethra, Skene Glands: Within normal limits             Vagina: No gross lesions or discharge  Cervix: No gross lesions or discharge, IUD string was seen.  Uterus  anteverted, normal size, shape and consistency, non-tender and mobile  Adnexa  Without masses or tenderness  Anus and perineum  normal   Rectovaginal  normal sphincter  tone without palpated masses or tenderness             Hemoccult not indicated     Assessment/Plan:  39 y.o. female for annual exam with past history vitamin D deficiency we will check her vitamin D level today. Also because of her occasional vasomotor symptoms and her weight  gain we'll check a TSH and FSH. Pap smear with HPV screening was done today. PCP has been doing the remainder of her blood work.   Ok Edwards MD, 11:51 AM 05/27/2015

## 2015-05-28 LAB — VITAMIN D 25 HYDROXY (VIT D DEFICIENCY, FRACTURES): Vit D, 25-Hydroxy: 49 ng/mL (ref 30–100)

## 2015-05-28 LAB — FOLLICLE STIMULATING HORMONE: FSH: 8.4 m[IU]/mL

## 2015-05-31 LAB — CYTOLOGY - PAP

## 2015-10-10 HISTORY — PX: OTHER SURGICAL HISTORY: SHX169
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
## 2016-03-29 ENCOUNTER — Encounter (HOSPITAL_COMMUNITY): Payer: Self-pay | Admitting: *Deleted

## 2016-03-29 ENCOUNTER — Observation Stay (HOSPITAL_COMMUNITY)
Admission: EM | Admit: 2016-03-29 | Discharge: 2016-04-02 | Disposition: A | Discharge status: Home / Self Care | Payer: Managed Care, Other (non HMO) | Attending: General Surgery | Admitting: General Surgery

## 2016-03-29 DIAGNOSIS — F419 Anxiety disorder, unspecified: Secondary | ICD-10-CM | POA: Diagnosis not present

## 2016-03-29 DIAGNOSIS — K219 Gastro-esophageal reflux disease without esophagitis: Secondary | ICD-10-CM | POA: Diagnosis not present

## 2016-03-29 DIAGNOSIS — K659 Peritonitis, unspecified: Secondary | ICD-10-CM

## 2016-03-29 DIAGNOSIS — F329 Major depressive disorder, single episode, unspecified: Secondary | ICD-10-CM | POA: Insufficient documentation

## 2016-03-29 DIAGNOSIS — Z9884 Bariatric surgery status: Secondary | ICD-10-CM | POA: Diagnosis not present

## 2016-03-29 DIAGNOSIS — E559 Vitamin D deficiency, unspecified: Secondary | ICD-10-CM | POA: Diagnosis not present

## 2016-03-29 DIAGNOSIS — R1012 Left upper quadrant pain: Principal | ICD-10-CM | POA: Insufficient documentation

## 2016-03-29 DIAGNOSIS — R109 Unspecified abdominal pain: Secondary | ICD-10-CM | POA: Diagnosis present

## 2016-03-29 DIAGNOSIS — J309 Allergic rhinitis, unspecified: Secondary | ICD-10-CM | POA: Diagnosis not present

## 2016-03-29 DIAGNOSIS — K589 Irritable bowel syndrome without diarrhea: Secondary | ICD-10-CM | POA: Insufficient documentation

## 2016-03-29 DIAGNOSIS — K9509 Other complications of gastric band procedure: Secondary | ICD-10-CM | POA: Diagnosis not present

## 2016-03-29 LAB — URINALYSIS, ROUTINE W REFLEX MICROSCOPIC
Glucose, UA: NEGATIVE mg/dL
Ketones, ur: NEGATIVE mg/dL
NITRITE: NEGATIVE
PROTEIN: NEGATIVE mg/dL
SPECIFIC GRAVITY, URINE: 1.025 (ref 1.005–1.030)
pH: 6 (ref 5.0–8.0)

## 2016-03-29 LAB — CBC
HCT: 38.1 % (ref 36.0–46.0)
HEMOGLOBIN: 13 g/dL (ref 12.0–15.0)
MCH: 28.6 pg (ref 26.0–34.0)
MCHC: 34.1 g/dL (ref 30.0–36.0)
MCV: 83.7 fL (ref 78.0–100.0)
PLATELETS: 314 10*3/uL (ref 150–400)
RBC: 4.55 MIL/uL (ref 3.87–5.11)
RDW: 12.8 % (ref 11.5–15.5)
WBC: 13.2 10*3/uL — ABNORMAL HIGH (ref 4.0–10.5)

## 2016-03-29 LAB — COMPREHENSIVE METABOLIC PANEL
ALK PHOS: 73 U/L (ref 38–126)
ALT: 10 U/L — ABNORMAL LOW (ref 14–54)
ANION GAP: 7 (ref 5–15)
AST: 12 U/L — ABNORMAL LOW (ref 15–41)
Albumin: 4.2 g/dL (ref 3.5–5.0)
BILIRUBIN TOTAL: 0.5 mg/dL (ref 0.3–1.2)
BUN: 12 mg/dL (ref 6–20)
CALCIUM: 9.1 mg/dL (ref 8.9–10.3)
CO2: 21 mmol/L — ABNORMAL LOW (ref 22–32)
Chloride: 109 mmol/L (ref 101–111)
Creatinine, Ser: 0.78 mg/dL (ref 0.44–1.00)
GFR calc non Af Amer: >60 mL/min (ref >60)
GLUCOSE: 84 mg/dL (ref 65–99)
Potassium: 3.5 mmol/L (ref 3.5–5.1)
Sodium: 137 mmol/L (ref 135–145)
TOTAL PROTEIN: 7.8 g/dL (ref 6.5–8.1)

## 2016-03-29 LAB — URINE MICROSCOPIC-ADD ON: Bacteria, UA: NONE SEEN

## 2016-03-29 LAB — LIPASE, BLOOD: Lipase: 19 U/L (ref 11–51)

## 2016-03-29 NOTE — ED Notes (Addendum)
Pt reports upper abdominal pain since yesterday, denies n/v/d.  [facility], given meds for UTI and abd pain. Pt reports pain has not subsided.

## 2016-03-30 ENCOUNTER — Emergency Department (HOSPITAL_COMMUNITY): Payer: Managed Care, Other (non HMO)

## 2016-03-30 ENCOUNTER — Encounter (HOSPITAL_COMMUNITY): Payer: Self-pay

## 2016-03-30 DIAGNOSIS — R109 Unspecified abdominal pain: Secondary | ICD-10-CM | POA: Diagnosis present

## 2016-03-30 LAB — POC URINE PREG, ED: Preg Test, Ur: NEGATIVE

## 2016-03-30 LAB — I-STAT CG4 LACTIC ACID, ED: Lactic Acid, Venous: 0.57 mmol/L (ref 0.5–2.0)

## 2016-03-30 LAB — GLUCOSE, CAPILLARY: Glucose-Capillary: 92 mg/dL (ref 65–99)

## 2016-03-30 MED ORDER — IOPAMIDOL (ISOVUE-300) INJECTION 61%
100.0000 mL | Freq: Once | INTRAVENOUS | Status: AC | PRN
  Administered 2016-03-30: 100 mL via INTRAVENOUS

## 2016-03-30 MED ORDER — ONDANSETRON HCL 4 MG/2ML IJ SOLN
4.0000 mg | Freq: Four times a day (QID) | INTRAMUSCULAR | Status: DC | PRN
  Administered 2016-04-01 (×2): 4 mg via INTRAVENOUS
  Filled 2016-03-30 (×2): qty 2

## 2016-03-30 MED ORDER — HYDROMORPHONE HCL 1 MG/ML IJ SOLN
1.0000 mg | Freq: Once | INTRAMUSCULAR | Status: AC
  Administered 2016-03-30: 1 mg via INTRAVENOUS
  Filled 2016-03-30: qty 1

## 2016-03-30 MED ORDER — SUCRALFATE 1 GM/10ML PO SUSP
1.0000 g | Freq: Three times a day (TID) | ORAL | Status: DC
  Administered 2016-03-30 – 2016-04-02 (×12): 1 g via ORAL
  Filled 2016-03-30 (×13): qty 10

## 2016-03-30 MED ORDER — ONDANSETRON 4 MG PO TBDP: 4.0000 mg | ORAL_TABLET | Freq: Four times a day (QID) | ORAL | Status: DC | PRN

## 2016-03-30 MED ORDER — PIPERACILLIN-TAZOBACTAM 3.375 G IVPB
3.3750 g | Freq: Three times a day (TID) | INTRAVENOUS | Status: DC
Start: 2016-03-30 — End: 2016-04-02
  Administered 2016-03-30 – 2016-04-02 (×9): 3.375 g via INTRAVENOUS
  Filled 2016-03-30 (×10): qty 50

## 2016-03-30 MED ORDER — HYDROMORPHONE HCL 1 MG/ML IJ SOLN
1.0000 mg | INTRAMUSCULAR | Status: DC | PRN
  Administered 2016-03-30 – 2016-04-01 (×9): 1 mg via INTRAVENOUS
  Filled 2016-03-30 (×10): qty 1

## 2016-03-30 MED ORDER — PANTOPRAZOLE SODIUM 40 MG PO TBEC
40.0000 mg | DELAYED_RELEASE_TABLET | Freq: Two times a day (BID) | ORAL | Status: DC
  Administered 2016-03-30 – 2016-04-02 (×7): 40 mg via ORAL
  Filled 2016-03-30 (×7): qty 1

## 2016-03-30 MED ORDER — PIPERACILLIN-TAZOBACTAM 3.375 G IVPB 30 MIN
3.3750 g | Freq: Once | INTRAVENOUS | Status: AC
  Administered 2016-03-30: 3.375 g via INTRAVENOUS
  Filled 2016-03-30: qty 50

## 2016-03-30 MED ORDER — IBUPROFEN 200 MG PO TABS: 600.0000 mg | ORAL_TABLET | Freq: Once | ORAL | Status: DC

## 2016-03-30 MED ORDER — HYDROMORPHONE HCL 1 MG/ML IJ SOLN
1.0000 mg | Freq: Once | INTRAMUSCULAR | Status: AC
Start: 2016-03-30 — End: 2016-03-30
  Administered 2016-03-30: 1 mg via INTRAVENOUS
  Filled 2016-03-30: qty 1

## 2016-03-30 MED ORDER — ENOXAPARIN SODIUM 40 MG/0.4ML ~~LOC~~ SOLN
40.0000 mg | SUBCUTANEOUS | Status: DC
  Administered 2016-03-30 – 2016-04-01 (×3): 40 mg via SUBCUTANEOUS
  Filled 2016-03-30 (×2): qty 0.4

## 2016-03-30 MED ORDER — KCL IN DEXTROSE-NACL 20-5-0.9 MEQ/L-%-% IV SOLN
INTRAVENOUS | Status: DC
  Administered 2016-03-30 – 2016-04-01 (×6): via INTRAVENOUS
  Administered 2016-04-02: 100 mL/h via INTRAVENOUS
  Filled 2016-03-30 (×10): qty 1000

## 2016-03-30 NOTE — H&P (Signed)
Chief Complaint: abdominal pain HPI: Janet Blanchard is a 40 year old female with a history of laparoscopic gastric banding in July 2013 by Dr. Excell Seltzer who presents with abdominal pain.  Started suddenly on Tuesday afternoon.  Location is in the epigastric region with radiation to the sides.  Associated with chills and sweats.  The pain is constant.  Worsened by any oral intake and even saliva.  Denies nausea or vomiting.  She reports eating pasta on Sunday, pizza on Tuesday.  She has been taking Aleve on regular basis for some tendonitis.  She also was on macrobid last week for a UTI and started on a Zpak and Doxycycline for another UTI on Tuesday.  Denies fevers.  Last BM was on Monday.  She is passing flatus.   ED evaluation reveals, WBC 13.2k, normal renal and hepatic function, UA without nitrates.   CT of abdomen and pelvis with vague and mild inflammation in the soft tissue surrounding the gastric band. We have therefore been asked to evaluate.   Past Medical History  Diagnosis Date  . IBS (irritable bowel syndrome)   . Anxiety and depression   . Obesity   . Migraine headache   . Allergic rhinitis   . Sinusitis   . Osteopenia   . DDD (degenerative disc disease)     L4-S1 with chronic back pain  . GERD (gastroesophageal reflux disease)   . Herniated disc     L 3-4, L 4-5, S1  . Hiatal hernia   . Vitamin D deficiency   . Cervical dysplasia   . Frequency   . Nocturia   . Back pain     Past Surgical History  Procedure Laterality Date  . Cholecystectomy  1/04  . Leep  2000  . Tonsillectomy  2005  . Septoplasty  2005  . Nasal turbinate reduction  2005  . Wisdom tooth extraction  2003  . Colonoscopy  09/07/2008    small internal hemorrhoids, otherwise normal (biopsies) into terminal ileum  . Esophagogastroduodenoscopy  7/07    hiatal hernia  . Sigmoidoscopy  7/07    ? colitis/proctitis - biopsies normal  . Cervical biopsy  w/ loop electrode excision    . Colposcopy    .  Laparoscopic gastric banding  04/09/2012    Procedure: LAPAROSCOPIC GASTRIC BANDING;  Surgeon: Edward Jolly, MD;  Location: [facility];  Service: General;  Laterality: N/A;  . Esophagogastroduodenoscopy N/A 08/18/2013    Procedure: ESOPHAGOGASTRODUODENOSCOPY (EGD);  Surgeon: Gatha Mayer, MD;  Location: [facility];  Service: Endoscopy;  Laterality: N/A;  office called to get patient put on schedule becase as of 08/15/2013 PT wasn't booked   . I&d extremity Right 09/20/2014    Procedure: IRRIGATION AND DEBRIDEMENT OF OPEN WOUND  RIGHT FOOT AND TRAUMATIC ARTHROTOMY;  Surgeon: Renette Butters, MD;  Location: [facility];  Service: Orthopedics;  Laterality: Right;  . Ankle reconstruction Right 09/20/2014    Procedure: LATERAL RECONSTRUCTION ANKLE RIGHT;  Surgeon: Renette Butters, MD;  Location: [facility];  Service: Orthopedics;  Laterality: Right;  . Complex wound closure Right 09/20/2014    Procedure: 7 CM COMPLEX WOUND CLOSURE;  Surgeon: Renette Butters, MD;  Location: [facility];  Service: Orthopedics;  Laterality: Right;    Family History  Problem Relation Age of Onset  . Diabetes Mother   . Hypertension Mother   . Hyperlipidemia Mother   . Kidney disease Maternal Grandfather   . Cirrhosis Maternal Grandfather     alcoholic  .  Hypertension Maternal Grandfather   . Heart disease      great uncle  . Uterine cancer Maternal Grandmother   . Hypertension Maternal Grandmother   . Hyperlipidemia Maternal Grandmother   . Colon cancer Neg Hx   . Hypertension Father   . Hypertension Paternal Grandmother   . Hypertension Paternal Grandfather    Social History:  reports that she has never smoked. She has never used smokeless tobacco. She reports that she drinks alcohol. She reports that she does not use illicit drugs.  Allergies:  Allergies  Allergen Reactions  . Levofloxacin Rash    Arms only.  . Sulfasalazine Rash    Elbows, knees, and three of her fingers only     (Not in a hospital  admission)  Results for orders placed or performed during the hospital encounter of 03/29/16 (from the past 48 hour(s))  Urinalysis, Routine w reflex microscopic     Status: Abnormal   Collection Time: 03/29/16  9:32 PM  Result Value Ref Range   Color, Urine AMBER (A) YELLOW    Comment: BIOCHEMICALS MAY BE AFFECTED BY COLOR   APPearance CLOUDY (A) CLEAR   Specific Gravity, Urine 1.025 1.005 - 1.030   pH 6.0 5.0 - 8.0   Glucose, UA NEGATIVE NEGATIVE mg/dL   Hgb urine dipstick LARGE (A) NEGATIVE   Bilirubin Urine SMALL (A) NEGATIVE   Ketones, ur NEGATIVE NEGATIVE mg/dL   Protein, ur NEGATIVE NEGATIVE mg/dL   Nitrite NEGATIVE NEGATIVE   Leukocytes, UA SMALL (A) NEGATIVE  Urine microscopic-add on     Status: Abnormal   Collection Time: 03/29/16  9:32 PM  Result Value Ref Range   Squamous Epithelial / LPF 6-30 (A) NONE SEEN   WBC, UA 0-5 0 - 5 WBC/hpf   RBC / HPF 6-30 0 - 5 RBC/hpf   Bacteria, UA NONE SEEN NONE SEEN  Lipase, blood     Status: None   Collection Time: 03/29/16  9:33 PM  Result Value Ref Range   Lipase 19 11 - 51 U/L  Comprehensive metabolic panel     Status: Abnormal   Collection Time: 03/29/16  9:33 PM  Result Value Ref Range   Sodium 137 135 - 145 mmol/L   Potassium 3.5 3.5 - 5.1 mmol/L   Chloride 109 101 - 111 mmol/L   CO2 21 (L) 22 - 32 mmol/L   Glucose, Bld 84 65 - 99 mg/dL   BUN 12 6 - 20 mg/dL   Creatinine, Ser 0.78 0.44 - 1.00 mg/dL   Calcium 9.1 8.9 - 10.3 mg/dL   Total Protein 7.8 6.5 - 8.1 g/dL   Albumin 4.2 3.5 - 5.0 g/dL   AST 12 (L) 15 - 41 U/L   ALT 10 (L) 14 - 54 U/L   Alkaline Phosphatase 73 38 - 126 U/L   Total Bilirubin 0.5 0.3 - 1.2 mg/dL   GFR calc non Af Amer >60 >60 mL/min   GFR calc Af Amer >60 >60 mL/min    Comment: (NOTE) The eGFR has been calculated using the CKD EPI equation. This calculation has not been validated in all clinical situations. eGFR's persistently <60 mL/min signify possible Chronic Kidney Disease.    Anion gap  7 5 - 15  CBC     Status: Abnormal   Collection Time: 03/29/16  9:33 PM  Result Value Ref Range   WBC 13.2 (H) 4.0 - 10.5 K/uL   RBC 4.55 3.87 - 5.11 MIL/uL   Hemoglobin 13.0 12.0 -  15.0 g/dL   HCT 38.1 36.0 - 46.0 %   MCV 83.7 78.0 - 100.0 fL   MCH 28.6 26.0 - 34.0 pg   MCHC 34.1 30.0 - 36.0 g/dL   RDW 12.8 11.5 - 15.5 %   Platelets 314 150 - 400 K/uL  POC urine preg, ED     Status: None   Collection Time: 03/30/16  1:56 AM  Result Value Ref Range   Preg Test, Ur NEGATIVE NEGATIVE    Comment:        THE SENSITIVITY OF THIS METHODOLOGY IS >24 mIU/mL   I-Stat CG4 Lactic Acid, ED  (not at  [facility])     Status: None   Collection Time: 03/30/16  4:39 AM  Result Value Ref Range   Lactic Acid, Venous 0.57 0.5 - 2.0 mmol/L   Ct Abdomen Pelvis W Contrast  03/30/2016  CLINICAL DATA:  Acute onset of epigastric and left upper quadrant abdominal pain. Leukocytosis. Red blood cells and white blood cells in the urine. Initial encounter. EXAM: CT ABDOMEN AND PELVIS WITH CONTRAST TECHNIQUE: Multidetector CT imaging of the abdomen and pelvis was performed using the standard protocol following bolus administration of intravenous contrast. CONTRAST:  164m ISOVUE-300 IOPAMIDOL (ISOVUE-300) INJECTION 61% COMPARISON:  CT of the chest, abdomen and pelvis performed 09/20/2014 FINDINGS: The visualized lung bases are clear. A 7 mm nonspecific hypodensity is noted within the left hepatic lobe. The liver and spleen are otherwise unremarkable. The patient is status post cholecystectomy, with clips noted along the gallbladder fossa. The pancreas and adrenal glands are grossly unremarkable in appearance. Vague mildly increased soft tissue inflammation is noted about the patient's gastric band at the stomach, raising question for a mild infectious or inflammatory process. The gastric band is otherwise grossly unremarkable. The kidneys are unremarkable in appearance. There is no evidence of hydronephrosis. No renal or  ureteral stones are seen. No perinephric stranding is appreciated. No free fluid is identified. The small bowel is unremarkable in appearance. The stomach is within normal limits. No acute vascular abnormalities are seen. The appendix is normal in caliber, without evidence of appendicitis. The colon is unremarkable in appearance. The bladder is mildly distended and grossly unremarkable. The uterus is unremarkable in appearance. An intrauterine device is noted in expected position at the fundus of the uterus. The ovaries are relatively symmetric. A 2.7 cm left adnexal cystic focus may reflect a follicle. No inguinal lymphadenopathy is seen. No acute osseous abnormalities are identified. Vacuum phenomenon is noted along the lower lumbar spine, with disc space narrowing. IMPRESSION: 1. Vague mildly increased soft tissue inflammation about the patient's gastric band at the stomach, raising question for a mild infectious or inflammatory process. Gastric band is otherwise grossly unremarkable. 2. 7 mm nonspecific hypodensity within the left hepatic lobe. 3. Minimal degenerative change along the lower lumbar spine. Electronically Signed   By: JGarald BaldingM.D.   On: 03/30/2016 03:15    Review of Systems  Constitutional: Positive for chills and diaphoresis. Negative for fever, weight loss and malaise/fatigue.  Eyes: Negative for blurred vision, double vision, photophobia, pain, discharge and redness.  Respiratory: Negative for cough, hemoptysis, sputum production, shortness of breath and wheezing.   Cardiovascular: Negative for chest pain, palpitations, orthopnea, claudication, leg swelling and PND.  Gastrointestinal: Positive for abdominal pain. Negative for heartburn, nausea, vomiting, diarrhea, constipation, blood in stool and melena.  Genitourinary: Negative for dysuria, urgency, frequency, hematuria and flank pain.  Musculoskeletal: Negative for myalgias, back pain, joint pain,  falls and neck pain.   Neurological: Negative for dizziness, tingling, tremors, sensory change, speech change, focal weakness, seizures, loss of consciousness and weakness.  Psychiatric/Behavioral: Negative for suicidal ideas and substance abuse.    Blood pressure 106/65, pulse 86, temperature 97.9 F (36.6 C), temperature source Oral, resp. rate 16, height '5\' 2"'  (1.575 m), weight 136.079 kg (300 lb), last menstrual period 03/27/2016, SpO2 93 %. Physical Exam  Constitutional: She is oriented to person, place, and time. She appears well-developed and well-nourished. No distress.  Cardiovascular: Normal rate, regular rhythm, normal heart sounds and intact distal pulses.  Exam reveals no gallop and no friction rub.   No murmur heard. Respiratory: Effort normal and breath sounds normal. No respiratory distress. She has no wheezes. She has no rales. She exhibits no tenderness.  GI: Soft. Bowel sounds are normal. She exhibits no distension. There is no rebound and no guarding.  ttp epigastric region.  No erythema or tenderness surrounding the band.  Musculoskeletal: Normal range of motion. She exhibits no edema.  Neurological: She is alert and oriented to person, place, and time.  Skin: Skin is warm and dry. No rash noted. She is not diaphoretic. No erythema. No pallor.  Psychiatric: She has a normal mood and affect. Her behavior is normal. Judgment and thought content normal.     Assessment/Plan S/p laparoscopic gastric banding Abdominal pain-the band appears in good position and there is no erosion on CT.   Mild inflammation noted, non tender on exam and no erythema. The patient may have a gastric ulcer versus infection of the lap band versus medication related as symptoms started at the same time as Doxycycline was started.   Will admit for observation.  Place on IV antibiotics, PPI, bowel rest, stop doxycycline and monitor closely. ID-zosyn  FEN-NPO, IVF, pain control VTE prophylaxis-SCD, lovenox  Dispo-admit to  floor   Leyanna Bittman, NP 03/30/2016, 7:51 AM

## 2016-03-30 NOTE — ED Notes (Signed)
E link called from black box around 4am for septic protocol review. Wanted to let provider know to maybe start fluid bolus x4 per weight of pt.  Talked to provider i stat lactic was below 4. Provider states pt not in septic shock, lactic was 0.59.  Informed charge nurse and provider and documented phone call and health protocol review.

## 2016-03-30 NOTE — Progress Notes (Signed)
Pharmacy Antibiotic Note  Janet Blanchard is a 40 y.o. female admitted on 03/29/2016 with Intra-abdominal infection.  Pharmacy has been consulted for zosyn dosing.  Plan: Zosyn 3.375g IV q8h (4 hour infusion).  Height: 5\' 2"  (157.5 cm) Weight: 300 lb (136.079 kg) IBW/kg (Calculated) : 50.1  Temp (24hrs), Avg:99.1 F (37.3 C), Min:97.9 F (36.6 C), Max:100.3 F (37.9 C)   Recent Labs Lab 03/29/16 2133  WBC 13.2*  CREATININE 0.78    Estimated Creatinine Clearance: 127.2 mL/min (by C-G formula based on Cr of 0.78).    Allergies  Allergen Reactions  . Levofloxacin Rash    Arms only.  . Sulfasalazine Rash    Elbows, knees, and three of her fingers only    Antimicrobials this admission: Zosyn 6/22 >>  Dose adjustments this admission: -  Microbiology results: pending  Thank you for allowing pharmacy to be a part of this patient's care.  Janet DavidsonGrimsley Jr, Janet Blanchard 03/30/2016 5:01 AM

## 2016-03-30 NOTE — ED Notes (Signed)
Surgery in room now with pt

## 2016-03-30 NOTE — ED Provider Notes (Addendum)
CSN: [id number]     Arrival date & time 03/29/16  2030 History  By signing my name below, I, Phillis HaggisGabriella Gaje, attest that this documentation has been prepared under the direction and in the presence of Derwood KaplanAnkit Makailyn Mccormick, MD. Electronically Signed: Phillis HaggisGabriella Gaje, ED Scribe. 03/30/2016. 5:40 AM.  Chief Complaint  Patient presents with  . Abdominal Pain   The history is provided by the patient. No language interpreter was used.  HPI Comments: Janet Blanchard is a 40 y.o. Female with a hx of IBS and GERD who presents to the Emergency Department complaining of gradually worsening, constant upper abdominal pain onset two days ago. Pt reports worsening, sharp pain with eating, drinking, and deep breathing. She reports that she has an appetite, but has not been eating due to pain. She states that she was seen at Urgent Care and diagnosed with a UTI for which she was given medication for. Pt is on Mirena and states that it is odd for her to have a menstrual cycle; she is currently on her menstrual cycle. She reports hx of lapband surgery with no complications and cholecystectomy. Pt's last BM was Monday. She denies nausea, vomiting, diarrhea, dysuria, or hematuria. Pt has a gastroenterologist.   Past Medical History  Diagnosis Date  . IBS (irritable bowel syndrome)   . Anxiety and depression   . Obesity   . Migraine headache   . Allergic rhinitis   . Sinusitis   . Osteopenia   . DDD (degenerative disc disease)     L4-S1 with chronic back pain  . GERD (gastroesophageal reflux disease)   . Herniated disc     L 3-4, L 4-5, S1  . Hiatal hernia   . Vitamin D deficiency   . Cervical dysplasia   . Frequency   . Nocturia   . Back pain    Past Surgical History  Procedure Laterality Date  . Cholecystectomy  1/04  . Leep  2000  . Tonsillectomy  2005  . Septoplasty  2005  . Nasal turbinate reduction  2005  . Wisdom tooth extraction  2003  . Colonoscopy  09/07/2008    small internal hemorrhoids,  otherwise normal (biopsies) into terminal ileum  . Esophagogastroduodenoscopy  7/07    hiatal hernia  . Sigmoidoscopy  7/07    ? colitis/proctitis - biopsies normal  . Cervical biopsy  w/ loop electrode excision    . Colposcopy    . Laparoscopic gastric banding  04/09/2012    Procedure: LAPAROSCOPIC GASTRIC BANDING;  Surgeon: Mariella SaaBenjamin T Hoxworth, MD;  Location: [facility];  Service: General;  Laterality: N/A;  . Esophagogastroduodenoscopy N/A 08/18/2013    Procedure: ESOPHAGOGASTRODUODENOSCOPY (EGD);  Surgeon: Iva Booparl E Gessner, MD;  [facility];  Service: Endoscopy;  Laterality: N/A;  office called to get patient put on schedule becase as of 08/15/2013 PT wasn't booked   . I&d extremity Right 09/20/2014    Procedure: IRRIGATION AND DEBRIDEMENT OF OPEN WOUND  RIGHT FOOT AND TRAUMATIC ARTHROTOMY;  Surgeon: Sheral Apleyimothy D Murphy, MD;  Location: [facility];  Service: Orthopedics;  Laterality: Right;  . Ankle reconstruction Right 09/20/2014    Procedure: LATERAL RECONSTRUCTION ANKLE RIGHT;  Surgeon: Sheral Apleyimothy D Murphy, MD;  Location: [facility];  Service: Orthopedics;  Laterality: Right;  . Complex wound closure Right 09/20/2014    Procedure: 7 CM COMPLEX WOUND CLOSURE;  Surgeon: Sheral Apleyimothy D Murphy, MD;  Location: [facility];  Service: Orthopedics;  Laterality: Right;   Family History  Problem Relation Age of  Onset  . Diabetes Mother   . Hypertension Mother   . Hyperlipidemia Mother   . Kidney disease Maternal Grandfather   . Cirrhosis Maternal Grandfather     alcoholic  . Hypertension Maternal Grandfather   . Heart disease      great uncle  . Uterine cancer Maternal Grandmother   . Hypertension Maternal Grandmother   . Hyperlipidemia Maternal Grandmother   . Colon cancer Neg Hx   . Hypertension Father   . Hypertension Paternal Grandmother   . Hypertension Paternal Grandfather    Social History  Substance Use Topics  . Smoking status: Never Smoker   . Smokeless tobacco: Never Used  . Alcohol Use: 0.0  oz/week    0 Standard drinks or equivalent per week     Comment: 3 glasses/week   OB History    Gravida Para Term Preterm AB TAB SAB Ectopic Multiple Living       Review of Systems 10 Systems reviewed and all are negative for acute change except as noted in the HPI.  Allergies  Levofloxacin and Sulfasalazine  Home Medications   Prior to Admission medications   Medication Sig Start Date End Date Taking? Authorizing Provider  ALPRAZolam Prudy Feeler) 0.5 MG tablet Take 0.5 mg by mouth at bedtime as needed for anxiety or sleep.    Yes Historical Provider, MD  azithromycin (ZITHROMAX) 250 MG tablet Take 1,000 mg by mouth once. 03/28/16  Yes Historical Provider, MD  baclofen (LIORESAL) 10 MG tablet Take 10 mg by mouth 2 (two) times daily as needed (migraines).  01/07/13  Yes Historical Provider, MD  BIOTIN PO Take 5,000 mg by mouth daily.    Yes Historical Provider, MD  busPIRone (BUSPAR) 15 MG tablet Take 15 mg by mouth 2 (two) times daily.   Yes Historical Provider, MD  caffeine 200 MG TABS Take 200 mg by mouth as needed (energy.). energy   Yes Historical Provider, MD  cetirizine (ZYRTEC) 10 MG tablet Take 10 mg by mouth daily.   Yes Historical Provider, MD  Cholecalciferol (VITAMIN D) 2000 UNITS CAPS Take 2 capsules by mouth daily.    Yes Historical Provider, MD  dexlansoprazole (DEXILANT) 60 MG capsule Take 60 mg by mouth daily.   Yes Historical Provider, MD  dicyclomine (BENTYL) 20 MG tablet Take 1 tablet (20 mg total) by mouth 2 (two) times daily as needed. Patient taking differently: Take 20 mg by mouth 2 (two) times daily as needed for spasms.  03/24/11  Yes Iva Boop, MD  doxycycline (VIBRA-TABS) 100 MG tablet Take 100 mg by mouth 2 (two) times daily. 03/28/16  Yes Historical Provider, MD  FLUoxetine (PROZAC) 40 MG capsule Take 40 mg by mouth daily.   Yes Historical Provider, MD  montelukast (SINGULAIR) 10 MG tablet Take 10 mg by mouth daily. Pt out of medication  currently   Yes Historical Provider, MD  omeprazole (PRILOSEC) 20 MG capsule Take 1 capsule (20 mg total) by mouth daily. 10/30/13  Yes Andy Liepins, PA-C  omeprazole (PRILOSEC) 40 MG capsule Take 40 mg by mouth daily. 05/20/15  Yes Historical Provider, MD  Probiotic Product (PROBIOTIC PO) Take 1 capsule by mouth daily.   Yes Historical Provider, MD  ranitidine (ZANTAC) 150 MG tablet Take 150 mg by mouth daily.    Yes Historical Provider, MD  simethicone (MYLICON) 125 MG chewable tablet Chew 125 mg by mouth every 6 (six) hours as needed for flatulence.  Yes Historical Provider, MD  zonisamide (ZONEGRAN) 100 MG capsule Take 200 mg by mouth at bedtime.   Yes Historical Provider, MD  zonisamide (ZONEGRAN) 50 MG capsule Take 50 mg by mouth daily. Patient takes with 200 mg to equal 250 mg   Yes Historical Provider, MD  docusate sodium (COLACE) 100 MG capsule Take 1 capsule (100 mg total) by mouth 2 (two) times daily. Continue this while taking narcotics to help with bowel movements Patient not taking: Reported on 03/29/2016 09/20/14   Sheral Apley, MD  oxyCODONE-acetaminophen (ROXICET) 5-325 MG per tablet Take 2 tablets by mouth every 4 (four) hours as needed. Patient not taking: Reported on 03/29/2016 09/20/14   Sheral Apley, MD   BP 107/71 mmHg  Pulse 91  Temp(Src) 98 F (36.7 C) (Oral)  Resp 20  Ht 5\' 2"  (1.575 m)  Wt 300 lb (136.079 kg)  BMI 54.86 kg/m2  SpO2 96%  LMP 03/27/2016 Physical Exam  Constitutional: She is oriented to person, place, and time. She appears well-developed and well-nourished.  HENT:  Head: Normocephalic and atraumatic.  Eyes: EOM are normal. Pupils are equal, round, and reactive to light.  Neck: Normal range of motion. Neck supple.  Cardiovascular: Normal rate, regular rhythm and normal heart sounds.  Exam reveals no gallop and no friction rub.   No murmur heard. Pulmonary/Chest: Effort normal and breath sounds normal. She has no wheezes.  Abdominal: Soft.  There is tenderness in the right upper quadrant and left upper quadrant.  TTP of upper quadrants, worse over the LUQ; no peritoneal signs  Musculoskeletal: Normal range of motion.  Neurological: She is alert and oriented to person, place, and time.  Skin: Skin is warm and dry.  Psychiatric: She has a normal mood and affect. Her behavior is normal.  Nursing note and vitals reviewed.   ED Course  Procedures (including critical care time) DIAGNOSTIC STUDIES: Oxygen Saturation is 98% on RA, normal by my interpretation.    COORDINATION OF CARE: 1:44 AM-Discussed treatment plan which includes labs and CT scan with pt at bedside and pt agreed to plan.    Labs Review Labs Reviewed  COMPREHENSIVE METABOLIC PANEL - Abnormal; Notable for the following:    CO2 21 (*)    AST 12 (*)    ALT 10 (*)    All other components within normal limits  CBC - Abnormal; Notable for the following:    WBC 13.2 (*)    All other components within normal limits  URINALYSIS, ROUTINE W REFLEX MICROSCOPIC (NOT AT [facility]) - Abnormal; Notable for the following:    Color, Urine AMBER (*)    APPearance CLOUDY (*)    Hgb urine dipstick LARGE (*)    Bilirubin Urine SMALL (*)    Leukocytes, UA SMALL (*)    All other components within normal limits  URINE MICROSCOPIC-ADD ON - Abnormal; Notable for the following:    Squamous Epithelial / LPF 6-30 (*)    All other components within normal limits  CULTURE, BLOOD (ROUTINE X 2)  CULTURE, BLOOD (ROUTINE X 2)  URINE CULTURE  LIPASE, BLOOD  POC URINE PREG, ED  I-STAT CG4 LACTIC ACID, ED    Imaging Review Ct Abdomen Pelvis W Contrast  03/30/2016  CLINICAL DATA:  Acute onset of epigastric and left upper quadrant abdominal pain. Leukocytosis. Red blood cells and white blood cells in the urine. Initial encounter. EXAM: CT ABDOMEN AND PELVIS WITH CONTRAST TECHNIQUE: Multidetector CT imaging of the abdomen and pelvis  was performed using the standard protocol following bolus  administration of intravenous contrast. CONTRAST:  100mL ISOVUE-300 IOPAMIDOL (ISOVUE-300) INJECTION 61% COMPARISON:  CT of the chest, abdomen and pelvis performed 09/20/2014 FINDINGS: The visualized lung bases are clear. A 7 mm nonspecific hypodensity is noted within the left hepatic lobe. The liver and spleen are otherwise unremarkable. The patient is status post cholecystectomy, with clips noted along the gallbladder fossa. The pancreas and adrenal glands are grossly unremarkable in appearance. Vague mildly increased soft tissue inflammation is noted about the patient's gastric band at the stomach, raising question for a mild infectious or inflammatory process. The gastric band is otherwise grossly unremarkable. The kidneys are unremarkable in appearance. There is no evidence of hydronephrosis. No renal or ureteral stones are seen. No perinephric stranding is appreciated. No free fluid is identified. The small bowel is unremarkable in appearance. The stomach is within normal limits. No acute vascular abnormalities are seen. The appendix is normal in caliber, without evidence of appendicitis. The colon is unremarkable in appearance. The bladder is mildly distended and grossly unremarkable. The uterus is unremarkable in appearance. An intrauterine device is noted in expected position at the fundus of the uterus. The ovaries are relatively symmetric. A 2.7 cm left adnexal cystic focus may reflect a follicle. No inguinal lymphadenopathy is seen. No acute osseous abnormalities are identified. Vacuum phenomenon is noted along the lower lumbar spine, with disc space narrowing. IMPRESSION: 1. Vague mildly increased soft tissue inflammation about the patient's gastric band at the stomach, raising question for a mild infectious or inflammatory process. Gastric band is otherwise grossly unremarkable. 2. 7 mm nonspecific hypodensity within the left hepatic lobe. 3. Minimal degenerative change along the lower lumbar spine.  Electronically Signed   By: Roanna RaiderJeffery  Chang M.D.   On: 03/30/2016 03:15   I have personally reviewed and evaluated these images and lab results as part of my medical decision-making.   EKG Interpretation None      MDM   Final diagnoses:  LAP-BAND surgery status  Abdominal infection (HCC)   I personally performed the services described in this documentation, which was scribed in my presence. The recorded information has been reviewed and is accurate.  Pt comes in with cc of abd pain. Abd pain is in the upper quadrants and started 2 days ago, getting worse. Pt has no nausea, diarrhea. She denies fevers, temop here is 100.3. She reports on meds for UTI - AND WE ARE NOT CONCERNED ABOUT UTI DURING THIS VISIT.  Pt reports anorexia due to pain and having emesis post prandial.  Exam - LUQ tenderness is worst. No peritoneal signs. Pt has hx of lap band, and the ddx is: SBO Ileus Lap band complication - including internal hernia / lap band slip. Cholelithiasis PUID  CT scan ordered.  5:40 AM CT scan shows concerns for infection. Pt will get zosyn. Given HR > 100 and WC > 90 - technically SIRS criteria. Again, the SIRs are non specific, and likely could have been due to any of the non infectious process outlined in the differential - but with the CT findings in hand, we will initiate sepsis workup as well.  Surgery to admit in the morning.     Derwood KaplanAnkit Illias Pantano, MD 03/30/16 [id number]  Derwood KaplanAnkit Dewaine Morocho, MD 03/30/16 [phone]

## 2016-03-30 NOTE — Procedures (Signed)
Preoperative diagnosis: band associated gastritis  Postoperative diagnosis: same   Procedure: access of subcutaneous port with removal of fluid from lap band Surgeon: Feliciana RossettiLuke Kevion Fatheree, M.D.  Asst: none  Anesthesia: noen  Indications for procedure: Janet Blanchard is a 40 y.o. year old female with symptoms of nausea, vomiting, abdominal pain.  Description of procedure: The area directly overlying the subcutaneous port was cleaned with alcohol. Next a Huber needle was used to acces the port and 5ml of fluid was removed. A bandaid was put in place for dressing. The patient tolerated the procedure well.  Findings: 5ml clear fluid  Specimen: none  Implant: none   Blood loss: none  Local anesthesia:   Complications: none  Feliciana RossettiLuke Ruari Duggan, M.D. General, Bariatric, & Minimally Invasive Surgery [facility]

## 2016-03-31 LAB — CBC
HEMATOCRIT: 34.2 % — AB (ref 36.0–46.0)
HEMOGLOBIN: 11.5 g/dL — AB (ref 12.0–15.0)
MCH: 28.8 pg (ref 26.0–34.0)
MCHC: 33.6 g/dL (ref 30.0–36.0)
MCV: 85.5 fL (ref 78.0–100.0)
Platelets: 307 10*3/uL (ref 150–400)
RBC: 4 MIL/uL (ref 3.87–5.11)
RDW: 12.8 % (ref 11.5–15.5)
WBC: 8.1 10*3/uL (ref 4.0–10.5)

## 2016-03-31 LAB — BASIC METABOLIC PANEL
ANION GAP: 3 — AB (ref 5–15)
BUN: 8 mg/dL (ref 6–20)
CALCIUM: 8.3 mg/dL — AB (ref 8.9–10.3)
CHLORIDE: 111 mmol/L (ref 101–111)
CO2: 23 mmol/L (ref 22–32)
Creatinine, Ser: 0.52 mg/dL (ref 0.44–1.00)
GFR calc non Af Amer: >60 mL/min (ref >60)
Glucose, Bld: 116 mg/dL — ABNORMAL HIGH (ref 65–99)
Potassium: 4.3 mmol/L (ref 3.5–5.1)
SODIUM: 137 mmol/L (ref 135–145)

## 2016-03-31 LAB — URINE CULTURE

## 2016-03-31 MED ORDER — MONTELUKAST SODIUM 10 MG PO TABS
10.0000 mg | ORAL_TABLET | Freq: Every day | ORAL | Status: DC
  Administered 2016-03-31 – 2016-04-01 (×2): 10 mg via ORAL
  Filled 2016-03-31 (×2): qty 1

## 2016-03-31 MED ORDER — BACLOFEN 10 MG PO TABS
10.0000 mg | ORAL_TABLET | Freq: Two times a day (BID) | ORAL | Status: DC | PRN
  Filled 2016-03-31: qty 1

## 2016-03-31 MED ORDER — ZONISAMIDE 100 MG PO CAPS
200.0000 mg | ORAL_CAPSULE | Freq: Every day | ORAL | Status: DC
  Filled 2016-03-31: qty 2

## 2016-03-31 MED ORDER — ZONISAMIDE 25 MG PO CAPS
250.0000 mg | ORAL_CAPSULE | Freq: Every day | ORAL | Status: DC
  Administered 2016-03-31: 200 mg via ORAL
  Administered 2016-04-01: 250 mg via ORAL
  Filled 2016-03-31 (×4): qty 2

## 2016-03-31 MED ORDER — BUSPIRONE HCL 5 MG PO TABS
15.0000 mg | ORAL_TABLET | Freq: Two times a day (BID) | ORAL | Status: DC
  Administered 2016-03-31 – 2016-04-01 (×4): 15 mg via ORAL
  Filled 2016-03-31 (×4): qty 3

## 2016-03-31 MED ORDER — ZONISAMIDE 50 MG PO CAPS: 50.0000 mg | ORAL_CAPSULE | Freq: Every day | ORAL | Status: DC

## 2016-03-31 MED ORDER — ALPRAZOLAM 0.5 MG PO TABS
0.5000 mg | ORAL_TABLET | Freq: Every evening | ORAL | Status: DC | PRN
  Administered 2016-04-01: 0.5 mg via ORAL
  Filled 2016-03-31: qty 1

## 2016-03-31 MED ORDER — FLUOXETINE HCL 20 MG PO CAPS
40.0000 mg | ORAL_CAPSULE | Freq: Every day | ORAL | Status: DC
  Administered 2016-03-31 – 2016-04-01 (×2): 40 mg via ORAL
  Filled 2016-03-31 (×3): qty 2

## 2016-03-31 NOTE — Progress Notes (Signed)
While rounding, pt was tearful stating that sugar substitutes gives her migraines and the bariatric clear diet has sugar substitutes NP paged and diet changed to regular clears.

## 2016-03-31 NOTE — Progress Notes (Signed)
Patient ID: Janet Blanchard, female   DOB: 19-Nov-1975, 40 y.o.   MRN: [id number]     [facility]      [address] [place], [place] [address]    Phone: [phone] FAX: [phone]     Subjective: Pain nearly resolved. Hungry.  Afebrile.  Vss.  WBC normalized.   Objective:  Vital signs:  Filed Vitals:   03/30/16 1739 03/30/16 2120 03/31/16 0205 03/31/16 0620  BP: 108/66 109/73 105/81 129/85  Pulse: 86 84 84 75  Temp: 98.9 F (37.2 C) 99.1 F (37.3 C) 98.1 F (36.7 C) 98.6 F (37 C)  TempSrc: Oral Oral Oral Oral  Resp: _0 Height:      Weight:      SpO2: 95% 99% 99% 99%    Last BM Date: 03/27/16  Intake/Output   Yesterday:  06/22 0701 - 06/23 0700 In: 2516.7 [P.O.:300; I.V.:2066.7; IV Piggyback:150] Out: -  This shift:    I/O last 3 completed shifts: In: 2516.7 [P.O.:300; I.V.:2066.7; IV Piggyback:150] Out: -    Physical Exam: General: Pt awake/alert/oriented x4 in no acute distress Chest: cta.  No chest wall pain w good excursion CV:  Pulses intact.  Regular rhythm MS: Normal AROM mjr joints.  No obvious deformity Abdomen: Soft.  Nondistended. Minimal ttp luq.  No evidence of peritonitis.  No incarcerated hernias. Ext:  SCDs BLE.  No mjr edema.  No cyanosis Skin: No petechiae / purpura    Problem List:   Active Problems:   Abdominal pain    Results:   Labs: Results for orders placed or performed during the hospital encounter of 03/29/16 (from the past 48 hour(s))  Urinalysis, Routine w reflex microscopic     Status: Abnormal   Collection Time: 03/29/16  9:32 PM  Result Value Ref Range   Color, Urine AMBER (A) YELLOW    Comment: BIOCHEMICALS MAY BE AFFECTED BY COLOR   APPearance CLOUDY (A) CLEAR   Specific Gravity, Urine 1.025 1.005 - 1.030   pH 6.0 5.0 - 8.0   Glucose, UA NEGATIVE NEGATIVE mg/dL   Hgb urine dipstick LARGE (A) NEGATIVE   Bilirubin Urine SMALL (A) NEGATIVE   Ketones, ur  NEGATIVE NEGATIVE mg/dL   Protein, ur NEGATIVE NEGATIVE mg/dL   Nitrite NEGATIVE NEGATIVE   Leukocytes, UA SMALL (A) NEGATIVE  Urine microscopic-add on     Status: Abnormal   Collection Time: 03/29/16  9:32 PM  Result Value Ref Range   Squamous Epithelial / LPF 6-30 (A) NONE SEEN   WBC, UA 0-5 0 - 5 WBC/hpf   RBC / HPF 6-30 0 - 5 RBC/hpf   Bacteria, UA NONE SEEN NONE SEEN  Lipase, blood     Status: None   Collection Time: 03/29/16  9:33 PM  Result Value Ref Range   Lipase 19 11 - 51 U/L  Comprehensive metabolic panel     Status: Abnormal   Collection Time: 03/29/16  9:33 PM  Result Value Ref Range   Sodium 137 135 - 145 mmol/L   Potassium 3.5 3.5 - 5.1 mmol/L   Chloride 109 101 - 111 mmol/L   CO2 21 (L) 22 - 32 mmol/L   Glucose, Bld 84 65 - 99 mg/dL   BUN 12 6 - 20 mg/dL   Creatinine, Ser 0.78 0.44 - 1.00 mg/dL   Calcium 9.1 8.9 - 10.3 mg/dL   Total Protein 7.8 6.5 - 8.1 g/dL   Albumin  4.2 3.5 - 5.0 g/dL   AST 12 (L) 15 - 41 U/L   ALT 10 (L) 14 - 54 U/L   Alkaline Phosphatase 73 38 - 126 U/L   Total Bilirubin 0.5 0.3 - 1.2 mg/dL   GFR calc non Af Amer >60 >60 mL/min   GFR calc Af Amer >60 >60 mL/min    Comment: (NOTE) The eGFR has been calculated using the CKD EPI equation. This calculation has not been validated in all clinical situations. eGFR's persistently <60 mL/min signify possible Chronic Kidney Disease.    Anion gap 7 5 - 15  CBC     Status: Abnormal   Collection Time: 03/29/16  9:33 PM  Result Value Ref Range   WBC 13.2 (H) 4.0 - 10.5 K/uL   RBC 4.55 3.87 - 5.11 MIL/uL   Hemoglobin 13.0 12.0 - 15.0 g/dL   HCT 38.1 36.0 - 46.0 %   MCV 83.7 78.0 - 100.0 fL   MCH 28.6 26.0 - 34.0 pg   MCHC 34.1 30.0 - 36.0 g/dL   RDW 12.8 11.5 - 15.5 %   Platelets 314 150 - 400 K/uL  POC urine preg, ED     Status: None   Collection Time: 03/30/16  1:56 AM  Result Value Ref Range   Preg Test, Ur NEGATIVE NEGATIVE    Comment:        THE SENSITIVITY OF THIS METHODOLOGY IS  >24 mIU/mL   I-Stat CG4 Lactic Acid, ED  (not at  [facility])     Status: None   Collection Time: 03/30/16  4:39 AM  Result Value Ref Range   Lactic Acid, Venous 0.57 0.5 - 2.0 mmol/L  Glucose, capillary     Status: None   Collection Time: 03/30/16  8:58 PM  Result Value Ref Range   Glucose-Capillary 92 65 - 99 mg/dL  CBC     Status: Abnormal   Collection Time: 03/31/16  7:31 AM  Result Value Ref Range   WBC 8.1 4.0 - 10.5 K/uL   RBC 4.00 3.87 - 5.11 MIL/uL   Hemoglobin 11.5 (L) 12.0 - 15.0 g/dL   HCT 34.2 (L) 36.0 - 46.0 %   MCV 85.5 78.0 - 100.0 fL   MCH 28.8 26.0 - 34.0 pg   MCHC 33.6 30.0 - 36.0 g/dL   RDW 12.8 11.5 - 15.5 %   Platelets 307 150 - 400 K/uL    Imaging / Studies: Ct Abdomen Pelvis W Contrast  03/30/2016  CLINICAL DATA:  Acute onset of epigastric and left upper quadrant abdominal pain. Leukocytosis. Red blood cells and white blood cells in the urine. Initial encounter. EXAM: CT ABDOMEN AND PELVIS WITH CONTRAST TECHNIQUE: Multidetector CT imaging of the abdomen and pelvis was performed using the standard protocol following bolus administration of intravenous contrast. CONTRAST:  155m ISOVUE-300 IOPAMIDOL (ISOVUE-300) INJECTION 61% COMPARISON:  CT of the chest, abdomen and pelvis performed 09/20/2014 FINDINGS: The visualized lung bases are clear. A 7 mm nonspecific hypodensity is noted within the left hepatic lobe. The liver and spleen are otherwise unremarkable. The patient is status post cholecystectomy, with clips noted along the gallbladder fossa. The pancreas and adrenal glands are grossly unremarkable in appearance. Vague mildly increased soft tissue inflammation is noted about the patient's gastric band at the stomach, raising question for a mild infectious or inflammatory process. The gastric band is otherwise grossly unremarkable. The kidneys are unremarkable in appearance. There is no evidence of hydronephrosis. No renal or ureteral stones are  seen. No perinephric  stranding is appreciated. No free fluid is identified. The small bowel is unremarkable in appearance. The stomach is within normal limits. No acute vascular abnormalities are seen. The appendix is normal in caliber, without evidence of appendicitis. The colon is unremarkable in appearance. The bladder is mildly distended and grossly unremarkable. The uterus is unremarkable in appearance. An intrauterine device is noted in expected position at the fundus of the uterus. The ovaries are relatively symmetric. A 2.7 cm left adnexal cystic focus may reflect a follicle. No inguinal lymphadenopathy is seen. No acute osseous abnormalities are identified. Vacuum phenomenon is noted along the lower lumbar spine, with disc space narrowing. IMPRESSION: 1. Vague mildly increased soft tissue inflammation about the patient's gastric band at the stomach, raising question for a mild infectious or inflammatory process. Gastric band is otherwise grossly unremarkable. 2. 7 mm nonspecific hypodensity within the left hepatic lobe. 3. Minimal degenerative change along the lower lumbar spine. Electronically Signed   By: Garald Balding M.D.   On: 03/30/2016 03:15    Medications / Allergies:  Scheduled Meds: . enoxaparin (LOVENOX) injection  40 mg Subcutaneous Q24H  . pantoprazole  40 mg Oral BID  . piperacillin-tazobactam (ZOSYN)  IV  3.375 g Intravenous Q8H  . sucralfate  1 g Oral TID WC & HS   Continuous Infusions: . dextrose 5 % and 0.9 % NaCl with KCl 20 mEq/L 100 mL/hr at 03/31/16 0554   PRN Meds:.HYDROmorphone (DILAUDID) injection, ondansetron **OR** ondansetron (ZOFRAN) IV  Antibiotics: Anti-infectives    Start     Dose/Rate Route Frequency Ordered Stop   03/30/16 1400  piperacillin-tazobactam (ZOSYN) IVPB 3.375 g     3.375 g 12.5 mL/hr over 240 Minutes Intravenous Every 8 hours 03/30/16 0454     03/30/16 0415  piperacillin-tazobactam (ZOSYN) IVPB 3.375 g     3.375 g 100 mL/hr over 30 Minutes Intravenous  Once  03/30/16 0413 03/30/16 0839        Assessment/Plan S/p laparoscopic gastric banding Abdominal pain Inflammation surrounding band -symptoms have significantly improved, WBC normal, start on clears.  Continue with atbx, carafate and PPI.  If symptoms worsen, endoscopy  ID-zosyn  FEN-bariatric clears VTE prophylaxis-SCD, lovenox  Dispo-pain   Erby Pian, ANP-BC [facility] Pager [phone](7A-4:30P)   03/31/2016 8:04 AM

## 2016-03-31 NOTE — Progress Notes (Signed)
PHARMACY NOTE  Pharmacy has been assisting with dosing of Zosyn for intra-abdominal infection. Dosage remains stable at Zosyn 3.375g IV q8h, each dose infusing over 4 hours. Need for further dosage adjustment appears unlikely at present.    Will sign off at this time.  Please reconsult if a change in clinical status warrants re-evaluation of dosage.   Greer PickerelJigna Melvia Matousek, PharmD, BCPS Pager: [phone] 03/31/2016 8:34 AM

## 2016-04-01 MED ORDER — LORATADINE 10 MG PO TABS
10.0000 mg | ORAL_TABLET | Freq: Every day | ORAL | Status: DC
  Administered 2016-04-01: 10 mg via ORAL
  Filled 2016-04-01: qty 1

## 2016-04-01 NOTE — Progress Notes (Signed)
  Subjective: Mild LUQ pain.  Overall, pain much better.  Tolerating liquid diet.  3 BMs yesterday.  Objective: Vital signs in last 24 hours: Temp:  [97.9 F (36.6 C)-99 F (37.2 C)] 99 F (37.2 C) (06/24 0552) Pulse Rate:  [71-83] 71 (06/24 0552) Resp:  [18] 18 (06/24 0552) BP: (117-124)/(70-81) 117/70 mmHg (06/24 0552) SpO2:  [100 %] 100 % (06/24 0552) Last BM Date: 03/31/16  Intake/Output from previous day: 06/23 0701 - 06/24 0700 In: 3510 [P.O.:960; I.V.:2400; IV Piggyback:150] Out: 1800 [Urine:1800] Intake/Output this shift:    PE: General- In NAD Abdomen-soft, obese, not tender  Lab Results:   Recent Labs  03/29/16 2133 03/31/16 0731  WBC 13.2* 8.1  HGB 13.0 11.5*  HCT 38.1 34.2*  PLT 314 307   BMET  Recent Labs  03/29/16 2133 03/31/16 0731  NA 137 137  K 3.5 4.3  CL 109 111  CO2 21* 23  GLUCOSE 84 116*  BUN 12 8  CREATININE 0.78 0.52  CALCIUM 9.1 8.3*   PT/INR No results for input(s): LABPROT, INR in the last 72 hours. Comprehensive Metabolic Panel:    Component Value Date/Time   NA 137 03/31/2016 0731   NA 137 03/29/2016 2133   K 4.3 03/31/2016 0731   K 3.5 03/29/2016 2133   CL 111 03/31/2016 0731   CL 109 03/29/2016 2133   CO2 23 03/31/2016 0731   CO2 21* 03/29/2016 2133   BUN 8 03/31/2016 0731   BUN 12 03/29/2016 2133   CREATININE 0.52 03/31/2016 0731   CREATININE 0.78 03/29/2016 2133   CREATININE 0.85 03/27/2013 1444   CREATININE 0.79 10/31/2011 1656   GLUCOSE 116* 03/31/2016 0731   GLUCOSE 84 03/29/2016 2133   CALCIUM 8.3* 03/31/2016 0731   CALCIUM 9.1 03/29/2016 2133   AST 12* 03/29/2016 2133   AST 16 09/20/2014 1630   ALT 10* 03/29/2016 2133   ALT 11 09/20/2014 1630   ALKPHOS 73 03/29/2016 2133   ALKPHOS 72 09/20/2014 1630   BILITOT 0.5 03/29/2016 2133   BILITOT 0.2* 09/20/2014 1630   PROT 7.8 03/29/2016 2133   PROT 6.8 09/20/2014 1630   ALBUMIN 4.2 03/29/2016 2133   ALBUMIN 3.6 09/20/2014 1630      Studies/Results: No results found.  Anti-infectives: Anti-infectives    Start     Dose/Rate Route Frequency Ordered Stop   03/30/16 1400  piperacillin-tazobactam (ZOSYN) IVPB 3.375 g     3.375 g 12.5 mL/hr over 240 Minutes Intravenous Every 8 hours 03/30/16 0454     03/30/16 0415  piperacillin-tazobactam (ZOSYN) IVPB 3.375 g     3.375 g 100 mL/hr over 30 Minutes Intravenous  Once 03/30/16 0413 03/30/16 0839      Assessment  S/p laparoscopic gastric banding 2013 Abdominal pain with inflammation surrounding band -continues to improve Continue with atbx, carafate and PPI. If symptoms worsen, endoscopy  ID-zosyn  FEN-fulls VTE prophylaxis-SCD, lovenox  Dispo-pain    Plan: Advance diet.  Continue treatment Rx.   Janet Blanchard Shela CommonsJ 04/01/2016

## 2016-04-02 MED ORDER — AMOXICILLIN-POT CLAVULANATE 875-125 MG PO TABS
1.0000 | ORAL_TABLET | Freq: Two times a day (BID) | ORAL | Status: DC
Start: 2016-04-02 — End: 2016-06-05

## 2016-04-02 NOTE — Progress Notes (Signed)
Pt's vitals are WNL, tolerating diet and pain is under control. Discussed discharge instructions with patient. Discharged to home with prescription.

## 2016-04-02 NOTE — Progress Notes (Signed)
  Subjective: Some LUQ pain after eating soft food but not as bad as it was when admitted.  Objective: Vital signs in last 24 hours: Temp:  [97.8 F (36.6 C)-98.6 F (37 C)] 98.6 F (37 C) (06/25 0441) Pulse Rate:  [70-76] 76 (06/25 0441) Resp:  [18-19] 18 (06/25 0441) BP: (117-119)/(57-94) 117/69 mmHg (06/25 0441) SpO2:  [98 %-100 %] 99 % (06/25 0441) Last BM Date: 03/31/16  Intake/Output from previous day: 06/24 0701 - 06/25 0700 In: 3600 [P.O.:1200; I.V.:2400] Out: 2550 [Urine:2550] Intake/Output this shift: Total I/O In: -  Out: 500 [Urine:500]  PE: General- In NAD Abdomen-soft, obese, not tender  Lab Results:   Recent Labs  03/31/16 0731  WBC 8.1  HGB 11.5*  HCT 34.2*  PLT 307   BMET  Recent Labs  03/31/16 0731  NA 137  K 4.3  CL 111  CO2 23  GLUCOSE 116*  BUN 8  CREATININE 0.52  CALCIUM 8.3*   PT/INR No results for input(s): LABPROT, INR in the last 72 hours. Comprehensive Metabolic Panel:    Component Value Date/Time   NA 137 03/31/2016 0731   NA 137 03/29/2016 2133   K 4.3 03/31/2016 0731   K 3.5 03/29/2016 2133   CL 111 03/31/2016 0731   CL 109 03/29/2016 2133   CO2 23 03/31/2016 0731   CO2 21* 03/29/2016 2133   BUN 8 03/31/2016 0731   BUN 12 03/29/2016 2133   CREATININE 0.52 03/31/2016 0731   CREATININE 0.78 03/29/2016 2133   CREATININE 0.85 03/27/2013 1444   CREATININE 0.79 10/31/2011 1656   GLUCOSE 116* 03/31/2016 0731   GLUCOSE 84 03/29/2016 2133   CALCIUM 8.3* 03/31/2016 0731   CALCIUM 9.1 03/29/2016 2133   AST 12* 03/29/2016 2133   AST 16 09/20/2014 1630   ALT 10* 03/29/2016 2133   ALT 11 09/20/2014 1630   ALKPHOS 73 03/29/2016 2133   ALKPHOS 72 09/20/2014 1630   BILITOT 0.5 03/29/2016 2133   BILITOT 0.2* 09/20/2014 1630   PROT 7.8 03/29/2016 2133   PROT 6.8 09/20/2014 1630   ALBUMIN 4.2 03/29/2016 2133   ALBUMIN 3.6 09/20/2014 1630     Studies/Results: No results found.  Anti-infectives: Anti-infectives     Start     Dose/Rate Route Frequency Ordered Stop   03/30/16 1400  piperacillin-tazobactam (ZOSYN) IVPB 3.375 g     3.375 g 12.5 mL/hr over 240 Minutes Intravenous Every 8 hours 03/30/16 0454     03/30/16 0415  piperacillin-tazobactam (ZOSYN) IVPB 3.375 g     3.375 g 100 mL/hr over 30 Minutes Intravenous  Once 03/30/16 0413 03/30/16 0839      Assessment  S/p laparoscopic gastric banding 2013 Abdominal pain with inflammation surrounding band, ? Band infection-less pain, no tenderness, WBC normal ID-zosyn     Plan: Discharge on oral antibiotics, liquid diet and soft foods as tolerated.  Follow up with Dr. Johna SheriffHoxworth in 1-2 weeks.   Janet Blanchard Shela CommonsJ 04/02/2016

## 2016-04-02 NOTE — Discharge Instructions (Signed)
Diet:  Liquids and solid foods (as tolerated)  Instead of Caffeine pill, drink 2-4 cups of paper filtered coffee per day.  Call Monday and make an appointment to be seen by Dr. Johna SheriffHoxworth this week or the week after.  Walk for at least 30 minutes continuously daily.  Call the office if you symptoms come back.

## 2016-04-02 NOTE — Discharge Summary (Signed)
Physician Discharge Summary  Patient ID: Janet Blanchard MRN: [id number] DOB/AGE: November 24, 1975 40 y.o.  Admit date: 03/29/2016 Discharge date: 04/02/2016  Admission Diagnoses:  LUQ abdominal pain  Discharge Diagnoses:  LUQ abdominal pain Inflammation around Lap Band   Discharged Condition: good  Hospital Course: She was admitted and placed on IV Zosyn.  Her WBC normalized and her pain slowly resolved.  She was able to tolerate liquids but had some minor discomfort with soft foods.  She was discharged home on Augmentin and on a liquid diet with solids as tolerated.  She will follow up with Dr. Johna SheriffHoxworth in the office.  She is aware that the Lap Band may eventually need to be removed.   Discharge Exam: Blood pressure 117/69, pulse 76, temperature 98.6 F (37 C), temperature source Oral, resp. rate 18, height 5\' 2"  (1.575 m), weight 136.079 kg (300 lb), last menstrual period 03/27/2016, SpO2 99 %.   Disposition: 01-Home or Self Care     Medication List    STOP taking these medications        caffeine 200 MG Tabs tablet     docusate sodium 100 MG capsule  Commonly known as:  COLACE     oxyCODONE-acetaminophen 5-325 MG tablet  Commonly known as:  ROXICET      TAKE these medications        ALPRAZolam 0.5 MG tablet  Commonly known as:  XANAX  Take 0.5 mg by mouth at bedtime as needed for anxiety or sleep.     amoxicillin-clavulanate 875-125 MG tablet  Commonly known as:  AUGMENTIN  Take 1 tablet by mouth 2 (two) times daily.     baclofen 10 MG tablet  Commonly known as:  LIORESAL  Take 10 mg by mouth 2 (two) times daily as needed (migraines).     BIOTIN PO  Take 5,000 mg by mouth daily.     busPIRone 15 MG tablet  Commonly known as:  BUSPAR  Take 15 mg by mouth 2 (two) times daily.     cetirizine 10 MG tablet  Commonly known as:  ZYRTEC  Take 10 mg by mouth daily.     DEXILANT 60 MG capsule  Generic drug:  dexlansoprazole  Take 60 mg by mouth daily.     dicyclomine 20 MG tablet  Commonly known as:  BENTYL  Take 1 tablet (20 mg total) by mouth 2 (two) times daily as needed.     FLUoxetine 40 MG capsule  Commonly known as:  PROZAC  Take 40 mg by mouth daily.     montelukast 10 MG tablet  Commonly known as:  SINGULAIR  Take 10 mg by mouth daily. Pt out of medication currently     omeprazole 40 MG capsule  Commonly known as:  PRILOSEC  Take 40 mg by mouth daily.     PROBIOTIC PO  Take 1 capsule by mouth daily.     ranitidine 150 MG tablet  Commonly known as:  ZANTAC  Take 150 mg by mouth daily.     simethicone 125 MG chewable tablet  Commonly known as:  MYLICON  Chew 125 mg by mouth every 6 (six) hours as needed for flatulence.     Vitamin D 2000 units Caps  Take 2 capsules by mouth daily.     zonisamide 50 MG capsule  Commonly known as:  ZONEGRAN  Take 50 mg by mouth daily. Patient takes with 200 mg to equal 250 mg     zonisamide 100 MG  capsule  Commonly known as:  ZONEGRAN  Take 200 mg by mouth at bedtime.         Signed: Adolph PollackOSENBOWER,Huan Pollok J 04/02/2016, 8:31 AM

## 2016-04-04 LAB — CULTURE, BLOOD (ROUTINE X 2)
Culture: NO GROWTH
Culture: NO GROWTH

## 2016-04-06 ENCOUNTER — Encounter (HOSPITAL_COMMUNITY): Payer: Self-pay | Admitting: *Deleted

## 2016-04-06 ENCOUNTER — Other Ambulatory Visit: Payer: Self-pay | Admitting: Surgery

## 2016-04-06 NOTE — H&P (Signed)
Re:   Janet Blanchard DOB:   12-Sep-1976 MRN:   [id number]   Admission H&P  ASSESSMENT AND PLAN: 1.  Abdominal pain, questionable infected lap band  2.  Lap band - 04/09/2012 - B. Hoxworth  Initial weight - 321, BMI - 58.86 Hiatal repair at the same time.  3.  Morbid obesity  Weight - 300, BMI - 54.9 4. History of IBS (irritable bowel syndrome) 5. History of anxiety and depression   No chief complaint on file.  REFERRING PHYSICIAN: Colette RibasGOLDING, JOHN CABOT, MD  HISTORY OF PRESENT ILLNESS: Janet Blanchard is a 40 y.o. (DOB: 12-Sep-1976)  AA  female whose primary care physician is Colette RibasGOLDING, JOHN CABOT, MD and comes to me today for upper endoscopy to evaluate her lap band.   She presented on 03/30/2016 for pain in the epigastric region with radiation to the sides. Associated with chills and sweats. The pain is constant. Worsened by any oral intake and even saliva. Denies nausea or vomiting. She reports eating pasta on Sunday, pizza on Tuesday. She has been taking Aleve on regular basis for some tendonitis. She also was on macrobid last week for a UTI and started on a Zpak and Doxycycline for another UTI on Tuesday. Denies fevers. Last BM was on Monday. She is passing flatus.    ED evaluation reveals, WBC 13.2k, normal renal and hepatic function, UA without nitrates.   CT of abdomen and pelvis on 03/30/2016 showed vague and mild inflammation in the soft tissue surrounding the gastric band. We have therefore been asked to evaluate.   She was discharged on 04/02/2016. She has had continued pain - so discussion was carried out to proceed with upper endoscopy.   Note, she has had an upper endoscopy by Dr. Leone PayorGessner on 08/18/2013.  There was no abnormality at that time.   She saw Mardelle Mattendy in our office on 04/05/2016 and he removed all the fluid from the lap band.  There were about 3 cc remaining.   Past Medical History  Diagnosis Date  . IBS (irritable bowel syndrome)   . Anxiety and  depression   . Obesity     BMI >54 ,s/p lap band 2013  . Allergic rhinitis   . Sinusitis     sinus infection 4'17- no problems now-tx. antibiotic  . Osteopenia   . DDD (degenerative disc disease)     L4-S1 with chronic back pain  . GERD (gastroesophageal reflux disease)   . Herniated disc     L 3-4, L 4-5, S1  . Hiatal hernia   . Vitamin D deficiency     improved  . Cervical dysplasia   . Frequency   . Nocturia   . Back pain   . Anxiety   . Depression   . Migraine headache     within a month 1-3 migraines  . IUD (intrauterine device) in place     Mirena remains in place      Past Surgical History  Procedure Laterality Date  . Leep  2000  . Tonsillectomy  2005  . Septoplasty  2005  . Nasal turbinate reduction  2005  . Wisdom tooth extraction  2003  . Colonoscopy  09/07/2008    small internal hemorrhoids, otherwise normal (biopsies) into terminal ileum  . Esophagogastroduodenoscopy  7/07    hiatal hernia  . Sigmoidoscopy  7/07    ? colitis/proctitis - biopsies normal  . Cervical biopsy  w/ loop electrode excision    . Colposcopy    .  Laparoscopic gastric banding  04/09/2012    Procedure: LAPAROSCOPIC GASTRIC BANDING;  Surgeon: [name], MD;  Location: [facility];  Service: General;  Laterality: N/A;  . Esophagogastroduodenoscopy N/A 08/18/2013    Procedure: ESOPHAGOGASTRODUODENOSCOPY (EGD);  Surgeon: Iva Boop, MD;  Location: [facility];  Service: Endoscopy;  Laterality: N/A;  office called to get patient put on schedule becase as of 08/15/2013 PT wasn't booked   . I&d extremity Right 09/20/2014    Procedure: IRRIGATION AND DEBRIDEMENT OF OPEN WOUND  RIGHT FOOT AND TRAUMATIC ARTHROTOMY;  Surgeon: Sheral Apley, MD;  Location: [facility];  Service: Orthopedics;  Laterality: Right;  . Ankle reconstruction Right 09/20/2014    Procedure: LATERAL RECONSTRUCTION ANKLE RIGHT;  Surgeon: Sheral Apley, MD;  Location: [facility] OR;  Service: Orthopedics;  Laterality:  Right;  . Complex wound closure Right 09/20/2014    Procedure: 7 CM COMPLEX WOUND CLOSURE;  Surgeon: Sheral Apley, MD;  Location: [facility];  Service: Orthopedics;  Laterality: Right;  . Cholecystectomy  1/04    laparoscopic      Current Facility-Administered Medications  Medication Dose Route Frequency Provider Last Rate Last Dose  . levonorgestrel (MIRENA) 20 MCG/24HR IUD   Intrauterine Once Trellis Paganini, MD       Current Outpatient Prescriptions  Medication Sig Dispense Refill  . ALPRAZolam (XANAX) 0.5 MG tablet Take 0.5 mg by mouth at bedtime as needed for anxiety or sleep.     Marland Kitchen amoxicillin-clavulanate (AUGMENTIN) 875-125 MG tablet Take 1 tablet by mouth 2 (two) times daily. 20 tablet 0  . baclofen (LIORESAL) 10 MG tablet Take 10 mg by mouth 2 (two) times daily as needed (migraines).     Marland Kitchen BIOTIN PO Take 5,000 mg by mouth daily.     . busPIRone (BUSPAR) 15 MG tablet Take 15 mg by mouth 2 (two) times daily.    . cetirizine (ZYRTEC) 10 MG tablet Take 10 mg by mouth daily.    . Cholecalciferol (VITAMIN D) 2000 UNITS CAPS Take 2 capsules by mouth daily.     Marland Kitchen dexlansoprazole (DEXILANT) 60 MG capsule Take 60 mg by mouth daily.    Marland Kitchen dicyclomine (BENTYL) 20 MG tablet Take 1 tablet (20 mg total) by mouth 2 (two) times daily as needed. (Patient taking differently: Take 20 mg by mouth 2 (two) times daily as needed for spasms. ) 60 tablet 11  . FLUoxetine (PROZAC) 40 MG capsule Take 40 mg by mouth daily.    . montelukast (SINGULAIR) 10 MG tablet Take 10 mg by mouth daily. Pt out of medication currently    . omeprazole (PRILOSEC) 40 MG capsule Take 40 mg by mouth daily.  7  . Probiotic Product (PROBIOTIC PO) Take 1 capsule by mouth daily.    . ranitidine (ZANTAC) 150 MG tablet Take 150 mg by mouth daily.     . simethicone (MYLICON) 125 MG chewable tablet Chew 125 mg by mouth every 6 (six) hours as needed for flatulence.    . zonisamide (ZONEGRAN) 100 MG capsule Take 200 mg by mouth at  bedtime.    Marland Kitchen zonisamide (ZONEGRAN) 50 MG capsule Take 50 mg by mouth daily. Patient takes with 200 mg to equal 250 mg        Allergies  Allergen Reactions  . Levofloxacin Rash    Arms only.  . Sulfasalazine Rash    Elbows, knees, and three of her fingers only    REVIEW OF SYSTEMS: Skin:  No history of  rash.  No history of abnormal moles. Infection:  No history of hepatitis or HIV.  No history of MRSA. Neurologic:  No history of stroke.  No history of seizure.  No history of headaches. Cardiac:  No history of hypertension. No history of heart disease.  No history of prior cardiac catheterization.  No history of seeing a cardiologist. Pulmonary:  Does not smoke cigarettes.  No asthma or bronchitis.  No OSA/CPAP.  Endocrine:  No diabetes. No thyroid disease. Gastrointestinal:  See HPI Urologic:  No history of kidney stones.  No history of bladder infections. Musculoskeletal:  History of herniated disk - lumbar.  Right ankle reconstruction - 09/20/2014 - T. Eulah PontMurphy. Hematologic:  No bleeding disorder.  No history of anemia.  Not anticoagulated. 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  PHYSICAL EXAM: LMP 03/27/2016  General: Obese AA F who is alert and generally healthy appearing.  HEENT: Normal. Pupils equal. Neck: Supple. No mass.  No thyroid mass. Lymph Nodes:  No supraclavicular or cervical nodes. Lungs: Clear to auscultation and symmetric breath sounds. Heart:  RRR. No murmur or rub. Abdomen: Soft. No mass.  No hernia. Normal bowel sounds.  Lap band in RUQ.  Pain in upper abdomen.    Extremities:  Good strength and ROM  in upper and lower extremities. Neurologic:  Grossly intact to motor and sensory function. Psychiatric: Has normal mood and affect. Behavior is normal.   DATA REVIEWED: Epic notes  Ovidio Kinavid Lizandro Spellman, MD,  [facility] [address]  [address]   [place], [place]    [id number] Phone:  [phone] FAX:  [phone]

## 2016-04-07 ENCOUNTER — Encounter (HOSPITAL_COMMUNITY)
Admission: RE | Disposition: A | Discharge status: Home / Self Care | Payer: Self-pay | Source: Ambulatory Visit | Attending: Surgery

## 2016-04-07 ENCOUNTER — Encounter (HOSPITAL_COMMUNITY): Payer: Self-pay

## 2016-04-07 ENCOUNTER — Ambulatory Visit (HOSPITAL_COMMUNITY): Payer: Managed Care, Other (non HMO) | Admitting: Certified Registered"

## 2016-04-07 ENCOUNTER — Ambulatory Visit (HOSPITAL_COMMUNITY)
Admission: RE | Admit: 2016-04-07 | Discharge: 2016-04-07 | Disposition: A | Discharge status: Home / Self Care | Payer: Managed Care, Other (non HMO) | Source: Ambulatory Visit | Attending: Surgery | Admitting: Surgery

## 2016-04-07 DIAGNOSIS — F419 Anxiety disorder, unspecified: Secondary | ICD-10-CM | POA: Insufficient documentation

## 2016-04-07 DIAGNOSIS — Z6841 Body Mass Index (BMI) 40.0 and over, adult: Secondary | ICD-10-CM | POA: Diagnosis not present

## 2016-04-07 DIAGNOSIS — Z9884 Bariatric surgery status: Secondary | ICD-10-CM | POA: Diagnosis not present

## 2016-04-07 DIAGNOSIS — K219 Gastro-esophageal reflux disease without esophagitis: Secondary | ICD-10-CM | POA: Diagnosis not present

## 2016-04-07 DIAGNOSIS — F329 Major depressive disorder, single episode, unspecified: Secondary | ICD-10-CM | POA: Diagnosis not present

## 2016-04-07 DIAGNOSIS — Z79899 Other long term (current) drug therapy: Secondary | ICD-10-CM | POA: Insufficient documentation

## 2016-04-07 DIAGNOSIS — Z9049 Acquired absence of other specified parts of digestive tract: Secondary | ICD-10-CM | POA: Diagnosis not present

## 2016-04-07 DIAGNOSIS — R1012 Left upper quadrant pain: Secondary | ICD-10-CM | POA: Insufficient documentation

## 2016-04-07 DIAGNOSIS — K2950 Unspecified chronic gastritis without bleeding: Secondary | ICD-10-CM | POA: Diagnosis not present

## 2016-04-07 HISTORY — PX: ESOPHAGOGASTRODUODENOSCOPY (EGD) WITH PROPOFOL: SHX5813

## 2016-04-07 SURGERY — ESOPHAGOGASTRODUODENOSCOPY (EGD) WITH PROPOFOL
Anesthesia: Monitor Anesthesia Care

## 2016-04-07 MED ORDER — LIDOCAINE HCL (CARDIAC) 20 MG/ML IV SOLN
INTRAVENOUS | Status: AC
  Filled 2016-04-07: qty 10

## 2016-04-07 MED ORDER — LACTATED RINGERS IV SOLN
INTRAVENOUS | Status: DC
  Administered 2016-04-07 (×2): via INTRAVENOUS

## 2016-04-07 MED ORDER — LIDOCAINE HCL (CARDIAC) 20 MG/ML IV SOLN
INTRAVENOUS | Status: DC | PRN
  Administered 2016-04-07: 30 mg via INTRAVENOUS

## 2016-04-07 MED ORDER — PROPOFOL 10 MG/ML IV BOLUS
INTRAVENOUS | Status: DC | PRN
  Administered 2016-04-07 (×2): 60 mg via INTRAVENOUS
  Administered 2016-04-07: 40 mg via INTRAVENOUS
  Administered 2016-04-07: 50 mg via INTRAVENOUS
  Administered 2016-04-07: 40 mg via INTRAVENOUS
  Administered 2016-04-07: 60 mg via INTRAVENOUS

## 2016-04-07 MED ORDER — PROPOFOL 10 MG/ML IV BOLUS
INTRAVENOUS | Status: AC
  Filled 2016-04-07: qty 40

## 2016-04-07 MED ORDER — ONDANSETRON HCL 4 MG/2ML IJ SOLN
INTRAMUSCULAR | Status: DC | PRN
  Administered 2016-04-07: 4 mg via INTRAVENOUS

## 2016-04-07 SURGICAL SUPPLY — 15 items

## 2016-04-07 NOTE — Transfer of Care (Signed)
Immediate Anesthesia Transfer of Care Note  Patient: Janet Blanchard  Procedure(s) Performed: Procedure(s): ESOPHAGOGASTRODUODENOSCOPY (EGD) WITH PROPOFOL (N/A)  Patient Location: PACU  Anesthesia Type:MAC  Level of Consciousness: awake  Airway & Oxygen Therapy: Patient Spontanous Breathing and Patient connected to nasal cannula oxygen  Post-op Assessment: Report given to RN and Post -op Vital signs reviewed and stable  Post vital signs: Reviewed and stable  Last Vitals:  Filed Vitals:   04/07/16 0823  BP: 138/77  Pulse: 79  Temp: 36.9 C  Resp: 17    Last Pain:  Filed Vitals:   04/07/16 0956  PainSc: 2          Complications: No apparent anesthesia complications

## 2016-04-07 NOTE — Interval H&P Note (Signed)
History and Physical Interval Note:  04/07/2016 9:48 AM  Janet Blanchard  has presented today for surgery, with the diagnosis of LUQ abdomina; pain lap band inflammation   The various methods of treatment have been discussed with the patient and family.  She is not married.  Her mother is with her.  After consideration of risks, benefits and other options for treatment, the patient has consented to  Procedure(s): ESOPHAGOGASTRODUODENOSCOPY (EGD) WITH PROPOFOL (N/A) as a surgical intervention .  The patient's history has been reviewed, patient examined, no change in status, stable for surgery.  I have reviewed the patient's chart and labs.  Questions were answered to the patient's satisfaction.     Katori Wirsing H

## 2016-04-07 NOTE — Anesthesia Postprocedure Evaluation (Signed)
Anesthesia Post Note  Patient: Josefina DoCarmen L Godden  Procedure(s) Performed: Procedure(s) (LRB): ESOPHAGOGASTRODUODENOSCOPY (EGD) WITH PROPOFOL (N/A)  Patient location during evaluation: PACU Anesthesia Type: MAC Level of consciousness: awake and alert Pain management: pain level controlled Vital Signs Assessment: post-procedure vital signs reviewed and stable Respiratory status: spontaneous breathing, nonlabored ventilation, respiratory function stable and patient connected to nasal cannula oxygen Cardiovascular status: blood pressure returned to baseline and stable Postop Assessment: no signs of nausea or vomiting Anesthetic complications: no    Last Vitals:  Filed Vitals:   04/07/16 1040 04/07/16 1050  BP: 111/66 130/80  Pulse: 72 75  Temp:    Resp: 11 17    Last Pain:  Filed Vitals:   04/07/16 1054  PainSc: 2                  Ho Parisi L

## 2016-04-07 NOTE — Anesthesia Preprocedure Evaluation (Addendum)
Anesthesia Evaluation  Patient identified by MRN, date of birth, ID band Patient awake    Reviewed: Allergy & Precautions, H&P , NPO status , Patient's Chart, lab work & pertinent test results  Airway Mallampati: III  TM Distance: >3 FB Neck ROM: full    Dental no notable dental hx. (+) Teeth Intact, Dental Advisory Given   Pulmonary neg pulmonary ROS,    Pulmonary exam normal breath sounds clear to auscultation       Cardiovascular Exercise Tolerance: Good negative cardio ROS Normal cardiovascular exam Rhythm:regular Rate:Normal     Neuro/Psych negative neurological ROS  negative psych ROS   GI/Hepatic negative GI ROS, Neg liver ROS, GERD  Medicated and Controlled,  Endo/Other  Morbid obesity  Renal/GU negative Renal ROS  negative genitourinary   Musculoskeletal   Abdominal (+) + obese,   Peds  Hematology negative hematology ROS (+)   Anesthesia Other Findings   Reproductive/Obstetrics negative OB ROS                            Anesthesia Physical Anesthesia Plan  ASA: IV  Anesthesia Plan: MAC   Post-op Pain Management:    Induction:   Airway Management Planned:   Additional Equipment:   Intra-op Plan:   Post-operative Plan:   Informed Consent:   Plan Discussed with:   Anesthesia Plan Comments:         Anesthesia Quick Evaluation

## 2016-04-07 NOTE — Op Note (Signed)
04/07/2016  10:40 AM  PATIENT:  Janet Blanchard, 40 y.o., female, MRN: [id number]  PREOP DIAGNOSIS:  Left upper quadrant pain, questionable inflammation around the lap band on CT scan  POSTOP DIAGNOSIS:   Normal lap band anatomy, no evidence of lap band erosion  [photos in chart]  PROCEDURE:  Esophagogastroduedonoscopy  SURGEON:   Ovidio Kinavid Selia Wareing, M.D.  ANESTHESIA:  Propofol sedation provided by anesthesia  INDICATIONS FOR PROCEDURE:  Janet Blanchard is a 40 y.o. (DOB: 25-Sep-1976)  AA  female whose primary care physician is Colette RibasGOLDING, JOHN CABOT, MD and comes for upper endoscopy to evaluate her lap band.   [name] had a lap band placed by Dr. Johna SheriffHoxworth on 04/09/2012.  She has not had much success losing weight.  About 10 days ago, she developed left upper abdominal pain, elevated WBC, and a CT scan on 03/30/2016 showed vague mildly increased soft tissue inflammation about the patient's gastric band at the stomach, raising question for a mild infectious or inflammatory process.  She has been on antibiotics, with some improvement of the left upper quadrant abdominal pain.  Though she still says the pain is 2-3/10.   The indications and risks of the endoscopy were explained to the patient.  The risks include, but are not limited to, perforation, bleeding, or injury to the bowel.  PROCEDURE:  The patient was monitored with a pulse oximetry, BP cuff, and EKG.  The patient has nasal O2 flowing during the procedure.    A flexible Pentax endoscope was passed down the throat without difficulty.  Findings include:   Esophagus:   Normal   GE junction at:  36 cm   Lap band:  The lap band imprint looked normal.  I saw no evidence of erosion.   Stomach:  Normal. I did do a biopsy for H. Pylori.   Duodenum:   Normal to the 3rd portion of duodenum  PLAN:  Follow up with Dr. Johna SheriffHoxworth next week.  Finish antibiotics.  Ovidio Kinavid Gurneet Matarese, MD, [facility] Pager: [phone] Office phone:   [phone]

## 2016-04-07 NOTE — Discharge Instructions (Signed)

## 2016-04-09 ENCOUNTER — Encounter (HOSPITAL_COMMUNITY): Payer: Self-pay | Admitting: Surgery

## 2016-05-19 ENCOUNTER — Other Ambulatory Visit: Payer: Self-pay | Admitting: General Surgery

## 2016-06-01 ENCOUNTER — Ambulatory Visit (INDEPENDENT_AMBULATORY_CARE_PROVIDER_SITE_OTHER): Payer: Managed Care, Other (non HMO) | Admitting: Gynecology

## 2016-06-01 ENCOUNTER — Encounter: Payer: Self-pay | Admitting: Gynecology

## 2016-06-01 ENCOUNTER — Other Ambulatory Visit: Payer: Self-pay | Admitting: General Surgery

## 2016-06-01 VITALS — BP 134/82 | Ht 62.0 in | Wt 309.0 lb

## 2016-06-01 DIAGNOSIS — Z30011 Encounter for initial prescription of contraceptive pills: Secondary | ICD-10-CM | POA: Diagnosis not present

## 2016-06-01 DIAGNOSIS — Z8639 Personal history of other endocrine, nutritional and metabolic disease: Secondary | ICD-10-CM | POA: Diagnosis not present

## 2016-06-01 DIAGNOSIS — Z113 Encounter for screening for infections with a predominantly sexual mode of transmission: Secondary | ICD-10-CM

## 2016-06-01 DIAGNOSIS — Z01419 Encounter for gynecological examination (general) (routine) without abnormal findings: Secondary | ICD-10-CM | POA: Diagnosis not present

## 2016-06-01 DIAGNOSIS — M8580 Other specified disorders of bone density and structure, unspecified site: Secondary | ICD-10-CM | POA: Diagnosis not present

## 2016-06-01 LAB — CBC WITH DIFFERENTIAL/PLATELET
BASOS ABS: 0 {cells}/uL (ref 0–200)
Basophils Relative: 0 %
EOS ABS: 156 {cells}/uL (ref 15–500)
EOS PCT: 3 %
HCT: 39.6 % (ref 35.0–45.0)
Hemoglobin: 13 g/dL (ref 11.7–15.5)
LYMPHS PCT: 33 %
Lymphs Abs: 1716 cells/uL (ref 850–3900)
MCH: 28.1 pg (ref 27.0–33.0)
MCHC: 32.8 g/dL (ref 32.0–36.0)
MCV: 85.7 fL (ref 80.0–100.0)
MONOS PCT: 8 %
MPV: 9.1 fL (ref 7.5–12.5)
Monocytes Absolute: 416 cells/uL (ref 200–950)
NEUTROS ABS: 2912 {cells}/uL (ref 1500–7800)
Neutrophils Relative %: 56 %
PLATELETS: 341 10*3/uL (ref 140–400)
RBC: 4.62 MIL/uL (ref 3.80–5.10)
RDW: 13.9 % (ref 11.0–15.0)
WBC: 5.2 10*3/uL (ref 3.8–10.8)

## 2016-06-01 LAB — COMPREHENSIVE METABOLIC PANEL
ALK PHOS: 80 U/L (ref 33–115)
ALT: 14 U/L (ref 6–29)
AST: 15 U/L (ref 10–30)
Albumin: 4.1 g/dL (ref 3.6–5.1)
BILIRUBIN TOTAL: 0.4 mg/dL (ref 0.2–1.2)
BUN: 11 mg/dL (ref 7–25)
CALCIUM: 9.2 mg/dL (ref 8.6–10.2)
CO2: 24 mmol/L (ref 20–31)
CREATININE: 0.94 mg/dL (ref 0.50–1.10)
Chloride: 105 mmol/L (ref 98–110)
GLUCOSE: 90 mg/dL (ref 65–99)
Potassium: 4.1 mmol/L (ref 3.5–5.3)
SODIUM: 141 mmol/L (ref 135–146)
Total Protein: 6.9 g/dL (ref 6.1–8.1)

## 2016-06-01 LAB — LIPID PANEL
Cholesterol: 184 mg/dL (ref 125–200)
HDL: 65 mg/dL (ref >=46)
LDL CALC: 100 mg/dL (ref <130)
Total CHOL/HDL Ratio: 2.8 Ratio (ref <=5.0)
Triglycerides: 97 mg/dL (ref <150)
VLDL: 19 mg/dL (ref <30)

## 2016-06-01 LAB — TSH: TSH: 1.24 mIU/L

## 2016-06-01 MED ORDER — NORETHIN ACE-ETH ESTRAD-FE 1-20 MG-MCG PO TABS: 1.0000 | ORAL_TABLET | Freq: Every day | ORAL | 4 refills | Status: DC

## 2016-06-01 NOTE — Patient Instructions (Signed)
Oral Contraception Information Oral contraceptive pills (OCPs) are medicines taken to prevent pregnancy. OCPs work by preventing the ovaries from releasing eggs. The hormones in OCPs also cause the cervical mucus to thicken, preventing the sperm from entering the uterus. The hormones also cause the uterine lining to become thin, not allowing a fertilized egg to attach to the inside of the uterus. OCPs are highly effective when taken exactly as prescribed. However, OCPs do not prevent sexually transmitted diseases (STDs). Safe sex practices, such as using condoms along with the pill, can help prevent STDs.  Before taking the pill, you may have a physical exam and Pap test. Your health care provider may order blood tests. The health care provider will make sure you are a good candidate for oral contraception. Discuss with your health care provider the possible side effects of the OCP you may be prescribed. When starting an OCP, it can take 2 to 3 months for the body to adjust to the changes in hormone levels in your body.  TYPES OF ORAL CONTRACEPTION  The combination pill--This pill contains estrogen and progestin (synthetic progesterone) hormones. The combination pill comes in 21-day, 28-day, or 91-day packs. Some types of combination pills are meant to be taken continuously (365-day pills). With 21-day packs, you do not take pills for 7 days after the last pill. With 28-day packs, the pill is taken every day. The last 7 pills are without hormones. Certain types of pills have more than 21 hormone-containing pills. With 91-day packs, the first 84 pills contain both hormones, and the last 7 pills contain no hormones or contain estrogen only.  The minipill--This pill contains the progesterone hormone only. The pill is taken every day continuously. It is very important to take the pill at the same time each day. The minipill comes in packs of 28 pills. All 28 pills contain the hormone.  ADVANTAGES OF ORAL  CONTRACEPTIVE PILLS  Decreases premenstrual symptoms.   Treats menstrual period cramps.   Regulates the menstrual cycle.   Decreases a heavy menstrual flow.   May treatacne, depending on the type of pill.   Treats abnormal uterine bleeding.   Treats polycystic ovarian syndrome.   Treats endometriosis.   Can be used as emergency contraception.  THINGS THAT CAN MAKE ORAL CONTRACEPTIVE PILLS LESS EFFECTIVE OCPs can be less effective if:   You forget to take the pill at the same time every day.   You have a stomach or intestinal disease that lessens the absorption of the pill.   You take OCPs with other medicines that make OCPs less effective, such as antibiotics, certain HIV medicines, and some seizure medicines.   You take expired OCPs.   You forget to restart the pill on day 7, when using the packs of 21 pills.  RISKS ASSOCIATED WITH ORAL CONTRACEPTIVE PILLS  Oral contraceptive pills can sometimes cause side effects, such as:  Headache.  Nausea.  Breast tenderness.  Irregular bleeding or spotting. Combination pills are also associated with a small increased risk of:  Blood clots.  Heart attack.  Stroke.   This information is not intended to replace advice given to you by your health care provider. Make sure you discuss any questions you have with your health care provider.   Document Released: 12/16/2002 Document Revised: 07/16/2013 Document Reviewed: 03/16/2013 Elsevier Interactive Patient Education 2016 Elsevier Inc. Bone Densitometry Bone densitometry is an imaging test that uses a special X-ray to measure the amount of calcium and other minerals in  your bones (bone density). This test is also known as a bone mineral density test or dual-energy X-ray absorptiometry (DXA). The test can measure bone density at your hip and your spine. It is similar to having a regular X-ray. You may have this test to:  Diagnose a condition that causes weak or  thin bones (osteoporosis).  Predict your risk of a broken bone (fracture).  Determine how well osteoporosis treatment is working. LET [facility]ARE PROVIDER KNOW ABOUT:  Any allergies you have.  All medicines you are taking, including vitamins, herbs, eye drops, creams, and over-the-counter medicines.  Previous problems you or members of your family have had with the use of anesthetics.  Any blood disorders you have.  Previous surgeries you have had.  Medical conditions you have.  Possibility of pregnancy.  Any other medical test you had within the previous 14 days that used contrast material. RISKS AND COMPLICATIONS Generally, this is a safe procedure. However, problems can occur and may include the following:  This test exposes you to a very small amount of radiation.  The risks of radiation exposure may be greater to unborn children. BEFORE THE PROCEDURE  Do not take any calcium supplements for 24 hours before having the test. You can otherwise eat and drink what you usually do.  Take off all metal jewelry, eyeglasses, dental appliances, and any other metal objects. PROCEDURE  You may lie on an exam table. There will be an X-ray generator below you and an imaging device above you.  Other devices, such as boxes or braces, may be used to position your body properly for the scan.  You will need to lie still while the machine slowly scans your body.  The images will show up on a computer monitor. AFTER THE PROCEDURE You may need more testing at a later time.   This information is not intended to replace advice given to you by your health care provider. Make sure you discuss any questions you have with your health care provider.   Document Released: 10/17/2004 Document Revised: 10/16/2014 Document Reviewed: 03/05/2014 Elsevier Interactive Patient Education [employer] Elsevier Inc[employer]

## 2016-06-01 NOTE — Progress Notes (Signed)
Janet Blanchard 01-08-1976 [id number]   History:    40 y.o.  for annual gyn exam who is overdue to have her Mirena IUD removed. She wanted to discuss other contraceptive options since she is not planning on having any children in the future. 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 Patient also has a new sexual partner and wanted to have a full STD screening today. Patient also has had history vitamin D deficiency in the past and also has had decreased bone mineralization on bone density study as a result of many years that she had been on Depo-Provera. The last bone density study was in 2011 and her Z-score  was -3.7 at the [facility].  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  Past medical history,surgical history, family history and social history were all reviewed and documented in the EPIC chart.  Gynecologic History Patient's last menstrual period was 04/01/2016. Contraception: IUD Last Pap: 2016. Results were: normal Last mammogram: 2012. Results were: normal  Obstetric History OB History  Gravida Para Term Preterm AB Living  0 0 0 0 0 0  SAB TAB Ectopic Multiple Live Births  0 0 0 0           ROS: A ROS was performed and pertinent positives and negatives are included in the history.  GENERAL: No fevers or chills. HEENT: No change in vision, no earache, sore throat or sinus congestion. NECK: No pain or stiffness. CARDIOVASCULAR: No chest pain or pressure. No palpitations. PULMONARY: No shortness of breath, cough or  wheeze. GASTROINTESTINAL: No abdominal pain, nausea, vomiting or diarrhea, melena or bright red blood per rectum. GENITOURINARY: No urinary frequency, urgency, hesitancy or dysuria. MUSCULOSKELETAL: No joint or muscle pain, no back pain, no recent trauma. DERMATOLOGIC: No rash, no itching, no lesions. ENDOCRINE: No polyuria, polydipsia, no heat or cold intolerance. No recent change in weight. HEMATOLOGICAL: No anemia or easy bruising or bleeding. NEUROLOGIC: No headache, seizures, numbness, tingling or weakness. PSYCHIATRIC: No depression, no loss of interest in normal activity or change in sleep pattern.     Exam: chaperone present  BP 134/82   Ht 5\' 2"  (1.575 m)   Wt (!) 309 lb (140.2 kg)   LMP 04/01/2016 Comment: MIRENA  BMI 56.52 kg/m   Body mass index is 56.52 kg/m.  General appearance : Well developed well nourished female. No acute distress HEENT: Eyes: no retinal hemorrhage or exudates,  Neck supple, trachea midline, no carotid bruits, no thyroidmegaly Lungs: Clear to auscultation, no rhonchi or wheezes, or rib retractions  Heart: Regular rate and rhythm, no murmurs or gallops Breast:Examined in sitting and supine position were symmetrical in appearance, no palpable masses or tenderness,  no skin retraction, no nipple inversion, no nipple discharge, no skin discoloration, no axillary or supraclavicular lymphadenopathy Abdomen: no palpable masses or tenderness, no rebound or guarding Extremities: no edema or skin discoloration or tenderness  Pelvic:  Bartholin, Urethra, Skene Glands: Within normal limits             Vagina:  No gross lesions or discharge  Cervix: No gross lesions or discharge, IUD string visualized  Uterus  anteverted, normal size, shape and consistency, non-tender and mobile  Adnexa  Without masses or tenderness  Anus and perineum  normal   Rectovaginal  normal sphincter tone without palpated masses or tenderness             Hemoccult not indicated      Assessment/Plan:  40 y.o. female for annual exam will return back in the next few weeks to have her IUD removed. She will then be prescribed Junel 1/20 oral contraceptive pill to take continuously and withdrawal every 3 months. The risks benefits and pros and cons were discussed to include increased risk of DVT and pulmonary embolism. Patient fully understands and accepts and literature information was provided. The following screening blood work was ordered today: Fasting lipid profile, TSH, comprehensive metabolic panel, CBC, and urinalysis. Pap smear with HPV screening was done today. Also as part STD screen the following was ordered: GC and Chlamydia culture, hepatitis B, hepatitis C, RPR and HIV. Patient's encouraged to do her monthly breast exams. Because of her low bone mass several years ago she will schedule a bone density study here in the next several months.   Ok Edwards MD, 10:56 AM 06/01/2016

## 2016-06-02 LAB — GC/CHLAMYDIA PROBE AMP
CT PROBE, AMP APTIMA: NOT DETECTED
GC PROBE AMP APTIMA: NOT DETECTED

## 2016-06-02 LAB — URINALYSIS W MICROSCOPIC + REFLEX CULTURE
BILIRUBIN URINE: NEGATIVE
Bacteria, UA: NONE SEEN [HPF]
Casts: NONE SEEN [LPF]
Crystals: NONE SEEN [HPF]
GLUCOSE, UA: NEGATIVE
Hgb urine dipstick: NEGATIVE
Ketones, ur: NEGATIVE
LEUKOCYTES UA: NEGATIVE
NITRITE: NEGATIVE
PH: 7.5 (ref 5.0–8.0)
Specific Gravity, Urine: 1.023 (ref 1.001–1.035)
YEAST: NONE SEEN [HPF]

## 2016-06-02 LAB — RPR

## 2016-06-02 LAB — HIV ANTIBODY (ROUTINE TESTING W REFLEX): HIV 1&2 Ab, 4th Generation: NONREACTIVE

## 2016-06-02 LAB — VITAMIN D 25 HYDROXY (VIT D DEFICIENCY, FRACTURES): VIT D 25 HYDROXY: 50 ng/mL (ref 30–100)

## 2016-06-02 LAB — HEPATITIS B SURFACE ANTIGEN: Hepatitis B Surface Ag: NEGATIVE

## 2016-06-02 LAB — HEPATITIS C ANTIBODY: HCV Ab: NEGATIVE

## 2016-06-03 LAB — URINE CULTURE: ORGANISM ID, BACTERIA: NO GROWTH

## 2016-06-06 LAB — PAP, TP IMAGING W/ HPV RNA, RFLX HPV TYPE 16,18/45: HPV MRNA, HIGH RISK: NOT DETECTED

## 2016-06-07 ENCOUNTER — Encounter (HOSPITAL_COMMUNITY): Payer: Self-pay

## 2016-06-07 NOTE — Progress Notes (Signed)
CBC WITH DIF, CMET, VIT, D, TSH, RPR, HEP B SURGFACE ANTIGEN, HIV ANTIBODY, HEP C ANTIBODY, UA CHLAMYDIA, URINE CULTURE, 06-01-16 EPIC

## 2016-06-07 NOTE — Patient Instructions (Addendum)
Josefina DoCarmen L Patalano  06/07/2016   Your procedure is scheduled on: 06-13-16  [facility]  [facility] to 3rd floor to  Short Stay Center at 515 AM.  Call this number if you have problems the morning of surgery [phone]   Remember: ONLY 1 PERSON MAY GO WITH YOU TO SHORT STAY TO GET  READY MORNING OF YOUR SURGERY.  Do not eat food or drink liquids :After Midnight.     Take these medicines the morning of surgery with A SIP OF WATER: BACLOFEN IF NEEDED, BUSPIRONE (BUSPAR),  SINGULAIR,  OMEPRAZOLE (PROLOSEC)                                You may not have any metal on your body including hair pins and              piercings  Do not wear jewelry, make-up, lotions, powders or perfumes, deodorant             Do not wear nail polish.  Do not shave  48 hours prior to surgery.              Men may shave face and neck.   Do not bring valuables to the hospital. [facility] IS NOT             RESPONSIBLE   FOR VALUABLES.  Contacts, dentures or bridgework may not be worn into surgery.  Leave suitcase in the car. After surgery it may be brought to your room.                 Please read over the following fact sheets you were given: _____________________________________________________________________             [facility] - Pre[facility] Before surgery, you can play an important role.  Because skin is not sterile, your skin needs to be as free of germs as possible.  You can reduce the number of germs on your skin by washing with CHG (chlorahexidine gluconate) soap before surgery.  CHG is an antiseptic cleaner which kills germs and bonds with the skin to continue killing germs even after washing. Please DO NOT use if you have an allergy to CHG or antibacterial soaps.  If your skin becomes reddened/irritated stop using the CHG and inform your nurse when you arrive at Short Stay. Do not shave (including legs and underarms) for at least 48  hours prior to the first CHG shower.  You may shave your face/neck. Please follow these instructions carefully:  1.  Shower with CHG Soap the night before surgery and the  morning of Surgery.  2.  If you choose to wash your hair, wash your hair first as usual with your  normal  shampoo.  3.  After you shampoo, rinse your hair and body thoroughly to remove the  shampoo.                           4.  Use CHG as you would any other liquid soap.  You can apply chg directly  to the skin and wash                       Gently with a scrungie or clean  washcloth.  5.  Apply the CHG Soap to your body ONLY FROM THE NECK DOWN.   Do not use on face/ open                           Wound or open sores. Avoid contact with eyes, ears mouth and genitals (private parts).                       Wash face,  Genitals (private parts) with your normal soap.             6.  Wash thoroughly, paying special attention to the area where your surgery  will be performed.  7.  Thoroughly rinse your body with warm water from the neck down.  8.  DO NOT shower/wash with your normal soap after using and rinsing off  the CHG Soap.                9.  Pat yourself dry with a clean towel.            10.  Wear clean pajamas.            11.  Place clean sheets on your bed the night of your first shower and do not  sleep with pets. Day of Surgery : Do not apply any lotions/deodorants the morning of surgery.  Please wear clean clothes to the hospital/surgery center.  FAILURE TO FOLLOW THESE INSTRUCTIONS MAY RESULT IN THE CANCELLATION OF YOUR SURGERY PATIENT SIGNATURE_________________________________  NURSE SIGNATURE__________________________________  ________________________________________________________________________

## 2016-06-08 ENCOUNTER — Encounter (HOSPITAL_COMMUNITY): Payer: Self-pay

## 2016-06-08 ENCOUNTER — Encounter (HOSPITAL_COMMUNITY)
Admission: RE | Admit: 2016-06-08 | Discharge: 2016-06-08 | Disposition: A | Discharge status: Home / Self Care | Payer: Managed Care, Other (non HMO) | Source: Ambulatory Visit | Attending: General Surgery | Admitting: General Surgery

## 2016-06-08 DIAGNOSIS — F329 Major depressive disorder, single episode, unspecified: Secondary | ICD-10-CM | POA: Diagnosis not present

## 2016-06-08 DIAGNOSIS — Z6841 Body Mass Index (BMI) 40.0 and over, adult: Secondary | ICD-10-CM | POA: Diagnosis not present

## 2016-06-08 DIAGNOSIS — Z8639 Personal history of other endocrine, nutritional and metabolic disease: Secondary | ICD-10-CM | POA: Diagnosis not present

## 2016-06-08 DIAGNOSIS — Z9884 Bariatric surgery status: Secondary | ICD-10-CM | POA: Insufficient documentation

## 2016-06-08 DIAGNOSIS — M545 Low back pain: Secondary | ICD-10-CM | POA: Insufficient documentation

## 2016-06-08 DIAGNOSIS — G8929 Other chronic pain: Secondary | ICD-10-CM | POA: Diagnosis not present

## 2016-06-08 DIAGNOSIS — E669 Obesity, unspecified: Secondary | ICD-10-CM | POA: Diagnosis present

## 2016-06-08 DIAGNOSIS — F419 Anxiety disorder, unspecified: Secondary | ICD-10-CM | POA: Insufficient documentation

## 2016-06-08 DIAGNOSIS — Z01818 Encounter for other preprocedural examination: Secondary | ICD-10-CM | POA: Insufficient documentation

## 2016-06-08 DIAGNOSIS — M8580 Other specified disorders of bone density and structure, unspecified site: Secondary | ICD-10-CM | POA: Diagnosis not present

## 2016-06-12 NOTE — Anesthesia Preprocedure Evaluation (Addendum)
Anesthesia Evaluation  Patient identified by MRN, date of birth, ID band Patient awake    Reviewed: Allergy & Precautions, H&P , NPO status , Patient's Chart, lab work & pertinent test results  Airway Mallampati: III  TM Distance: >3 FB Neck ROM: full    Dental no notable dental hx. (+) Teeth Intact, Dental Advisory Given   Pulmonary neg pulmonary ROS,    Pulmonary exam normal breath sounds clear to auscultation       Cardiovascular Exercise Tolerance: Good negative cardio ROS Normal cardiovascular exam Rhythm:regular Rate:Normal     Neuro/Psych negative neurological ROS  negative psych ROS   GI/Hepatic Neg liver ROS, hiatal hernia, GERD  Medicated and Controlled,  Endo/Other  Morbid obesity  Renal/GU negative Renal ROS     Musculoskeletal  (+) Arthritis ,   Abdominal (+) + obese,   Peds  Hematology negative hematology ROS (+)   Anesthesia Other Findings   Reproductive/Obstetrics negative OB ROS                             Anesthesia Physical  Anesthesia Plan  ASA: IV  Anesthesia Plan: General   Post-op Pain Management:    Induction: Intravenous  Airway Management Planned: Oral ETT  Additional Equipment:   Intra-op Plan:   Post-operative Plan: Extubation in OR  Informed Consent: I have reviewed the patients History and Physical, chart, labs and discussed the procedure including the risks, benefits and alternatives for the proposed anesthesia with the patient or authorized representative who has indicated his/her understanding and acceptance.   Dental advisory given  Plan Discussed with: CRNA  Anesthesia Plan Comments:         Anesthesia Quick Evaluation

## 2016-06-13 ENCOUNTER — Encounter (HOSPITAL_COMMUNITY): Payer: Self-pay | Admitting: Anesthesiology

## 2016-06-13 ENCOUNTER — Ambulatory Visit (HOSPITAL_COMMUNITY): Payer: Managed Care, Other (non HMO) | Admitting: Anesthesiology

## 2016-06-13 ENCOUNTER — Encounter (HOSPITAL_COMMUNITY)
Admission: RE | Disposition: A | Discharge status: Home / Self Care | Payer: Self-pay | Source: Ambulatory Visit | Attending: General Surgery

## 2016-06-13 ENCOUNTER — Ambulatory Visit (HOSPITAL_COMMUNITY)
Admission: RE | Admit: 2016-06-13 | Discharge: 2016-06-13 | Disposition: A | Discharge status: Home / Self Care | Payer: Managed Care, Other (non HMO) | Source: Ambulatory Visit | Attending: General Surgery | Admitting: General Surgery

## 2016-06-13 DIAGNOSIS — F329 Major depressive disorder, single episode, unspecified: Secondary | ICD-10-CM | POA: Insufficient documentation

## 2016-06-13 DIAGNOSIS — F419 Anxiety disorder, unspecified: Secondary | ICD-10-CM | POA: Insufficient documentation

## 2016-06-13 DIAGNOSIS — Z79899 Other long term (current) drug therapy: Secondary | ICD-10-CM | POA: Insufficient documentation

## 2016-06-13 DIAGNOSIS — Z791 Long term (current) use of non-steroidal anti-inflammatories (NSAID): Secondary | ICD-10-CM | POA: Diagnosis not present

## 2016-06-13 DIAGNOSIS — K219 Gastro-esophageal reflux disease without esophagitis: Secondary | ICD-10-CM | POA: Insufficient documentation

## 2016-06-13 DIAGNOSIS — Z79891 Long term (current) use of opiate analgesic: Secondary | ICD-10-CM | POA: Diagnosis not present

## 2016-06-13 DIAGNOSIS — Y848 Other medical procedures as the cause of abnormal reaction of the patient, or of later complication, without mention of misadventure at the time of the procedure: Secondary | ICD-10-CM | POA: Insufficient documentation

## 2016-06-13 DIAGNOSIS — Z9884 Bariatric surgery status: Secondary | ICD-10-CM

## 2016-06-13 DIAGNOSIS — Z6841 Body Mass Index (BMI) 40.0 and over, adult: Secondary | ICD-10-CM | POA: Diagnosis not present

## 2016-06-13 DIAGNOSIS — M1990 Unspecified osteoarthritis, unspecified site: Secondary | ICD-10-CM | POA: Diagnosis not present

## 2016-06-13 DIAGNOSIS — K9509 Other complications of gastric band procedure: Secondary | ICD-10-CM | POA: Insufficient documentation

## 2016-06-13 LAB — PREGNANCY, URINE: PREG TEST UR: NEGATIVE

## 2016-06-13 SURGERY — REMOVAL, GASTRIC BAND, LAPAROSCOPIC
Anesthesia: General | Site: Abdomen

## 2016-06-13 MED ORDER — PROPOFOL 10 MG/ML IV BOLUS
INTRAVENOUS | Status: AC
  Filled 2016-06-13: qty 20

## 2016-06-13 MED ORDER — ROCURONIUM BROMIDE 100 MG/10ML IV SOLN
INTRAVENOUS | Status: AC
  Filled 2016-06-13: qty 1

## 2016-06-13 MED ORDER — ONDANSETRON HCL 4 MG/2ML IJ SOLN
INTRAMUSCULAR | Status: AC
  Filled 2016-06-13: qty 2

## 2016-06-13 MED ORDER — HYDROMORPHONE HCL 1 MG/ML IJ SOLN
INTRAMUSCULAR | Status: AC
  Filled 2016-06-13: qty 1

## 2016-06-13 MED ORDER — MEPERIDINE HCL 50 MG/ML IJ SOLN: 6.2500 mg | INTRAMUSCULAR | Status: DC | PRN

## 2016-06-13 MED ORDER — HYDROMORPHONE HCL 1 MG/ML IJ SOLN
0.2500 mg | INTRAMUSCULAR | Status: DC | PRN
  Administered 2016-06-13 (×4): 0.5 mg via INTRAVENOUS

## 2016-06-13 MED ORDER — MIDAZOLAM HCL 2 MG/2ML IJ SOLN
INTRAMUSCULAR | Status: AC
  Filled 2016-06-13: qty 2

## 2016-06-13 MED ORDER — 0.9 % SODIUM CHLORIDE (POUR BTL) OPTIME
TOPICAL | Status: DC | PRN
  Administered 2016-06-13: 1000 mL

## 2016-06-13 MED ORDER — DEXAMETHASONE SODIUM PHOSPHATE 10 MG/ML IJ SOLN
INTRAMUSCULAR | Status: AC
  Filled 2016-06-13: qty 1

## 2016-06-13 MED ORDER — CHLORHEXIDINE GLUCONATE CLOTH 2 % EX PADS: 6.0000 | MEDICATED_PAD | Freq: Once | CUTANEOUS | Status: DC

## 2016-06-13 MED ORDER — SODIUM CHLORIDE 0.9 % IV SOLN: 20.0000 mL | Freq: Once | INTRAVENOUS | Status: DC

## 2016-06-13 MED ORDER — HYDROCODONE-ACETAMINOPHEN 5-325 MG PO TABS: 1.0000 | ORAL_TABLET | ORAL | 0 refills | Status: DC | PRN

## 2016-06-13 MED ORDER — MIDAZOLAM HCL 5 MG/5ML IJ SOLN
INTRAMUSCULAR | Status: DC | PRN
  Administered 2016-06-13: 2 mg via INTRAVENOUS

## 2016-06-13 MED ORDER — FENTANYL CITRATE (PF) 250 MCG/5ML IJ SOLN
INTRAMUSCULAR | Status: AC
  Filled 2016-06-13: qty 5

## 2016-06-13 MED ORDER — PROPOFOL 10 MG/ML IV BOLUS
INTRAVENOUS | Status: DC | PRN
  Administered 2016-06-13: 200 mg via INTRAVENOUS

## 2016-06-13 MED ORDER — FENTANYL CITRATE (PF) 100 MCG/2ML IJ SOLN
INTRAMUSCULAR | Status: DC | PRN
  Administered 2016-06-13: 50 ug via INTRAVENOUS
  Administered 2016-06-13 (×2): 100 ug via INTRAVENOUS

## 2016-06-13 MED ORDER — SODIUM CHLORIDE 0.9 % IJ SOLN
INTRAMUSCULAR | Status: DC | PRN
  Administered 2016-06-13: 50 mL

## 2016-06-13 MED ORDER — LIDOCAINE HCL (CARDIAC) 20 MG/ML IV SOLN
INTRAVENOUS | Status: DC | PRN
  Administered 2016-06-13: 100 mg via INTRAVENOUS

## 2016-06-13 MED ORDER — SODIUM CHLORIDE 0.9 % IJ SOLN
INTRAMUSCULAR | Status: AC
  Filled 2016-06-13: qty 50

## 2016-06-13 MED ORDER — ROCURONIUM BROMIDE 100 MG/10ML IV SOLN
INTRAVENOUS | Status: DC | PRN
  Administered 2016-06-13: 60 mg via INTRAVENOUS
  Administered 2016-06-13: 20 mg via INTRAVENOUS

## 2016-06-13 MED ORDER — BUPIVACAINE LIPOSOME 1.3 % IJ SUSP
20.0000 mL | Freq: Once | INTRAMUSCULAR | Status: AC
  Administered 2016-06-13: 20 mL
  Filled 2016-06-13: qty 20

## 2016-06-13 MED ORDER — CEFOTETAN DISODIUM-DEXTROSE 2-2.08 GM-% IV SOLR
INTRAVENOUS | Status: AC
  Filled 2016-06-13: qty 50

## 2016-06-13 MED ORDER — LACTATED RINGERS IV SOLN
INTRAVENOUS | Status: DC | PRN
  Administered 2016-06-13 (×2): via INTRAVENOUS

## 2016-06-13 MED ORDER — PROMETHAZINE HCL 25 MG/ML IJ SOLN: 6.2500 mg | INTRAMUSCULAR | Status: DC | PRN

## 2016-06-13 MED ORDER — HYDROMORPHONE HCL 2 MG/ML IJ SOLN
INTRAMUSCULAR | Status: AC
  Filled 2016-06-13: qty 1

## 2016-06-13 MED ORDER — SUGAMMADEX SODIUM 200 MG/2ML IV SOLN
INTRAVENOUS | Status: DC | PRN
  Administered 2016-06-13: 200 mg via INTRAVENOUS

## 2016-06-13 MED ORDER — HEPARIN SODIUM (PORCINE) 5000 UNIT/ML IJ SOLN
5000.0000 [IU] | INTRAMUSCULAR | Status: AC
  Administered 2016-06-13: 5000 [IU] via SUBCUTANEOUS
  Filled 2016-06-13: qty 1

## 2016-06-13 MED ORDER — LIDOCAINE 2% (20 MG/ML) 5 ML SYRINGE
INTRAMUSCULAR | Status: AC
  Filled 2016-06-13: qty 5

## 2016-06-13 MED ORDER — LACTATED RINGERS IR SOLN
Status: DC | PRN
  Administered 2016-06-13: 1000 mL

## 2016-06-13 MED ORDER — EPHEDRINE SULFATE 50 MG/ML IJ SOLN
INTRAMUSCULAR | Status: DC | PRN
  Administered 2016-06-13: 10 mg via INTRAVENOUS

## 2016-06-13 MED ORDER — FENTANYL CITRATE (PF) 100 MCG/2ML IJ SOLN
INTRAMUSCULAR | Status: AC
  Filled 2016-06-13: qty 2

## 2016-06-13 MED ORDER — OXYCODONE-ACETAMINOPHEN 5-325 MG PO TABS: 1.0000 | ORAL_TABLET | ORAL | 0 refills | Status: DC | PRN

## 2016-06-13 MED ORDER — ACETAMINOPHEN 10 MG/ML IV SOLN
INTRAVENOUS | Status: AC
  Filled 2016-06-13: qty 100

## 2016-06-13 MED ORDER — CEFOTETAN DISODIUM-DEXTROSE 2-2.08 GM-% IV SOLR
2.0000 g | INTRAVENOUS | Status: AC
  Administered 2016-06-13: 2 g via INTRAVENOUS

## 2016-06-13 MED ORDER — ACETAMINOPHEN 10 MG/ML IV SOLN
1000.0000 mg | Freq: Once | INTRAVENOUS | Status: AC
  Administered 2016-06-13: 1000 mg via INTRAVENOUS

## 2016-06-13 SURGICAL SUPPLY — 54 items
BLADE HEX COATED 2.75 (ELECTRODE) ×3 IMPLANT
BLADE SURG 15 STRL LF DISP TIS (BLADE) IMPLANT
BLADE SURG 15 STRL SS (BLADE)
BLADE SURG SZ11 CARB STEEL (BLADE) ×3 IMPLANT
CABLE HIGH FREQUENCY MONO STRZ (ELECTRODE) ×3 IMPLANT
COVER SURGICAL LIGHT HANDLE (MISCELLANEOUS) ×3 IMPLANT
DECANTER SPIKE VIAL GLASS SM (MISCELLANEOUS) ×4 IMPLANT
DEVICE SUT QUICK LOAD TK 5 (STAPLE) ×3 IMPLANT
DEVICE SUT TI-KNOT TK 5X26 (MISCELLANEOUS) ×1 IMPLANT
DEVICE SUTURE ENDOST 10MM (ENDOMECHANICALS) IMPLANT
DEVICE TI KNOT TK5 (MISCELLANEOUS)
ELECT PENCIL ROCKER SW 15FT (MISCELLANEOUS) ×3 IMPLANT
ELECT REM PT RETURN 9FT ADLT (ELECTROSURGICAL) ×3
ELECTRODE REM PT RTRN 9FT ADLT (ELECTROSURGICAL) ×1 IMPLANT
GLOVE BIOGEL PI IND STRL 7.0 (GLOVE) ×1 IMPLANT
GLOVE BIOGEL PI INDICATOR 7.0 (GLOVE) ×2
GLOVE ECLIPSE 7.5 STRL STRAW (GLOVE) ×3 IMPLANT
GOWN STRL REUS W/TWL LRG LVL3 (GOWN DISPOSABLE) ×3 IMPLANT
GOWN STRL REUS W/TWL XL LVL3 (GOWN DISPOSABLE) ×6 IMPLANT
GRASPER SUT TROCAR 14GX15 (MISCELLANEOUS) ×2 IMPLANT
HOVERMATT SINGLE USE (MISCELLANEOUS) ×3 IMPLANT
IRRIG SUCT STRYKERFLOW 2 WTIP (MISCELLANEOUS)
IRRIGATION SUCT STRKRFLW 2 WTP (MISCELLANEOUS) IMPLANT
KIT BASIN OR (CUSTOM PROCEDURE TRAY) ×3 IMPLANT
LIQUID BAND (GAUZE/BANDAGES/DRESSINGS) ×3 IMPLANT
NDL SPNL 22GX3.5 QUINCKE BK (NEEDLE) ×1 IMPLANT
NEEDLE SPNL 22GX3.5 QUINCKE BK (NEEDLE) ×3 IMPLANT
NS IRRIG 1000ML POUR BTL (IV SOLUTION) ×3 IMPLANT
PACK UNIVERSAL I (CUSTOM PROCEDURE TRAY) ×3 IMPLANT
QUICK LOAD TK 5 (STAPLE)
SHEARS HARMONIC ACE PLUS 36CM (ENDOMECHANICALS) IMPLANT
SLEEVE ADV FIXATION 5X100MM (TROCAR) ×5 IMPLANT
SOLUTION ANTI FOG 6CC (MISCELLANEOUS) ×3 IMPLANT
SPONGE LAP 18X18 X RAY DECT (DISPOSABLE) ×1 IMPLANT
STAPLER VISISTAT 35W (STAPLE) ×1 IMPLANT
SUT ETHIBOND 2 0 SH (SUTURE)
SUT ETHIBOND 2 0 SH 36X2 (SUTURE) ×3 IMPLANT
SUT MNCRL AB 4-0 PS2 18 (SUTURE) ×5 IMPLANT
SUT PROLENE 2 0 CT2 30 (SUTURE) ×1 IMPLANT
SUT SILK 0 (SUTURE)
SUT SILK 0 30XBRD TIE 6 (SUTURE) ×1 IMPLANT
SUT SURGIDAC NAB ES-9 0 48 120 (SUTURE) IMPLANT
SUT VIC AB 2-0 SH 27 (SUTURE)
SUT VIC AB 2-0 SH 27X BRD (SUTURE) ×1 IMPLANT
SUT VICRYL 0 TIES 12 18 (SUTURE) ×2 IMPLANT
SYR 20CC LL (SYRINGE) ×3 IMPLANT
SYR CONTROL 10ML LL (SYRINGE) ×3 IMPLANT
TOWEL OR 17X26 10 PK STRL BLUE (TOWEL DISPOSABLE) ×3 IMPLANT
TROCAR ADV FIXATION 11X100MM (TROCAR) IMPLANT
TROCAR BLADELESS 15MM (ENDOMECHANICALS) ×2 IMPLANT
TROCAR BLADELESS OPT 5 100 (ENDOMECHANICALS) ×2 IMPLANT
TROCAR XCEL NON-BLD 11X100MML (ENDOMECHANICALS) IMPLANT
TUBE CALIBRATION LAPBAND (TUBING) ×3 IMPLANT
TUBING INSUF HEATED (TUBING) ×3 IMPLANT

## 2016-06-13 NOTE — Transfer of Care (Signed)
Immediate Anesthesia Transfer of Care Note  Patient: Janet Blanchard  Procedure(s) Performed: Procedure(s): LAPAROSCOPIC REMOVAL OF GASTRIC BAND WITH UPPER ENDO (N/A)  Patient Location: PACU  Anesthesia Type:General  Level of Consciousness: alert , oriented and patient cooperative  Airway & Oxygen Therapy: Patient connected to face mask oxygen  Post-op Assessment: Post -op Vital signs reviewed and stable and Patient moving all extremities X 4  Post vital signs: stable  Last Vitals:  Vitals:   06/13/16 0517  BP: (!) 135/92  Pulse: 82  Resp: 16  Temp: 36.9 C    Last Pain:  Vitals:   06/13/16 0517  TempSrc: Oral         Complications: No apparent anesthesia complications

## 2016-06-13 NOTE — Anesthesia Procedure Notes (Signed)
Procedure Name: Intubation Date/Time: 06/13/2016 7:35 AM Performed by: Thornell MuleSTUBBLEFIELD, Aisha Greenberger G Pre-anesthesia Checklist: Patient identified, Emergency Drugs available, Suction available and Patient being monitored Patient Re-evaluated:Patient Re-evaluated prior to inductionOxygen Delivery Method: Circle system utilized Preoxygenation: Pre-oxygenation with 100% oxygen Intubation Type: IV induction Ventilation: Mask ventilation without difficulty Laryngoscope Size: Miller and 3 Grade View: Grade I Tube type: Oral Tube size: 7.0 mm Number of attempts: 1 Airway Equipment and Method: Stylet and Oral airway Placement Confirmation: ETT inserted through vocal cords under direct vision,  positive ETCO2 and breath sounds checked- equal and bilateral Secured at: 20 cm Tube secured with: Tape Dental Injury: Teeth and Oropharynx as per pre-operative assessment

## 2016-06-13 NOTE — Interval H&P Note (Signed)
History and Physical Interval Note:  06/13/2016 7:15 AM  Janet Blanchard  has presented today for surgery, with the diagnosis of ABDOMINAL PAIN AND BARIATRIC SURGERY STATUS  The various methods of treatment have been discussed with the patient and family. After consideration of risks, benefits and other options for treatment, the patient has consented to  Procedure(s): LAPAROSCOPIC REMOVAL OF GASTRIC BAND WITH UPPER ENDO (N/A) as a surgical intervention .  The patient's history has been reviewed, patient examined, no change in status, stable for surgery.  I have reviewed the patient's chart and labs.  Questions were answered to the patient's satisfaction.     Dann Ventress T

## 2016-06-13 NOTE — Anesthesia Postprocedure Evaluation (Signed)
Anesthesia Post Note  Patient: Janet Blanchard  Procedure(s) Performed: Procedure(s) (LRB): LAPAROSCOPIC REMOVAL OF GASTRIC BAND WITH UPPER ENDO (N/A)  Patient location during evaluation: PACU Anesthesia Type: General Level of consciousness: sedated and patient cooperative Pain management: pain level controlled Vital Signs Assessment: post-procedure vital signs reviewed and stable Respiratory status: spontaneous breathing Cardiovascular status: stable Anesthetic complications: no    Last Vitals:  Vitals:   06/13/16 1019 06/13/16 1029  BP: 107/78 114/69  Pulse: 95 96  Resp: (!) 24 20  Temp:  36.5 C    Last Pain:  Vitals:   06/13/16 1000  TempSrc:   PainSc: 7                  Lewie LoronJohn Tauno Falotico

## 2016-06-13 NOTE — Discharge Instructions (Signed)
° °General Anesthesia, Adult, Care After °Refer to this sheet in the next few weeks. These instructions provide you with information on caring for yourself after your procedure. Your health care provider may also give you more specific instructions. Your treatment has been planned according to current medical practices, but problems sometimes occur. Call your health care provider if you have any problems or questions after your procedure. °WHAT TO EXPECT AFTER THE PROCEDURE °After the procedure, it is typical to experience: °Sleepiness. °Nausea and vomiting. °HOME CARE INSTRUCTIONS °For the first 24 hours after general anesthesia: °Have a responsible person with you. °Do not drive a car. If you are alone, do not take public transportation. °Do not drink alcohol. °Do not take medicine that has not been prescribed by your health care provider. °Do not sign important papers or make important decisions. °You may resume a normal diet and activities as directed by your health care provider. °Change bandages (dressings) as directed. °If you have questions or problems that seem related to general anesthesia, call the hospital and ask for the anesthetist or anesthesiologist on call. °SEEK MEDICAL CARE IF: °You have nausea and vomiting that continue the day after anesthesia. °You develop a rash. °SEEK IMMEDIATE MEDICAL CARE IF:  °You have difficulty breathing. °You have chest pain. °You have any allergic problems. °  °This information is not intended to replace advice given to you by your health care provider. Make sure you discuss any questions you have with your health care provider. °  °Document Released: 01/01/2001 Document Revised: 10/16/2014 Document Reviewed: 01/24/2012 °Elsevier Interactive Patient Education ©2016 [employer] °CCS ______[facility] °LAPAROSCOPIC SURGERY: POST OP INSTRUCTIONS °Always review your discharge instruction sheet given to you by the facility where your surgery was  performed. °IF YOU HAVE DISABILITY OR FAMILY LEAVE FORMS, YOU MUST BRING THEM TO THE OFFICE FOR PROCESSING.   °DO NOT GIVE THEM TO YOUR DOCTOR. ° °1. A prescription for pain medication may be given to you upon discharge.  Take your pain medication as prescribed, if needed.  If narcotic pain medicine is not needed, then you may take acetaminophen (Tylenol) or ibuprofen (Advil) as needed. °2. Take your usually prescribed medications unless otherwise directed. °3. If you need a refill on your pain medication, please contact your pharmacy.  They will contact our office to request authorization. Prescriptions will not be filled after 5pm or on week-ends. °4. You should follow a light diet the first few days after arrival home, such as soup and crackers, etc.  Be sure to include lots of fluids daily. °5. Most patients will experience some swelling and bruising in the area of the incisions.  Ice packs will help.  Swelling and bruising can take several days to resolve.  °6. It is common to experience some constipation if taking pain medication after surgery.  Increasing fluid intake and taking a stool softener (such as Colace) will usually help or prevent this problem from occurring.  A mild laxative (Milk of Magnesia or Miralax) should be taken according to package instructions if there are no bowel movements after 48 hours. °7. Unless discharge instructions indicate otherwise, you may remove your bandages 24-48 hours after surgery, and you may shower at that time.  You may have steri-strips (small skin tapes) in place directly over the incision.  These strips should be left on the skin for 7-10 days.  If your surgeon used skin glue on the incision, you may shower in 24 hours.  The glue   will flake off over the next 2-3 weeks.  Any sutures or staples will be removed at the office during your follow-up visit. °8. ACTIVITIES:  You may resume regular (light) daily activities beginning the next day--such as daily self-care,  walking, climbing stairs--gradually increasing activities as tolerated.  You may have sexual intercourse when it is comfortable.  Refrain from any heavy lifting or straining until approved by your doctor. °a. You may drive when you are no longer taking prescription pain medication, you can comfortably wear a seatbelt, and you can safely maneuver your car and apply brakes. °b. RETURN TO WORK:  __________________________________________________________ °9. You should see your doctor in the office for a follow-up appointment approximately 2-3 weeks after your surgery.  Make sure that you call for this appointment within a day or two after you arrive home to insure a convenient appointment time. °10. OTHER INSTRUCTIONS: __________________________________________________________________________________________________________________________ __________________________________________________________________________________________________________________________ °WHEN TO CALL YOUR DOCTOR: °1. Fever over 101.0 °2. Inability to urinate °3. Continued bleeding from incision. °4. Increased pain, redness, or drainage from the incision. °5. Increasing abdominal pain ° °The clinic staff is available to answer your questions during regular business hours.  Please don’t hesitate to call and ask to speak to one of the nurses for clinical concerns.  If you have a medical emergency, go to the nearest emergency room or call 911.  A surgeon from [facility] is always on call at the hospital. °[address], [address], [place], [place]  [address] ? [address] [address], [place], [place]   [address] °[phone] ? [phone] ? FAX [phone] °Web site: [email or web address] ° °

## 2016-06-13 NOTE — H&P (Signed)
History of Present Illness Janet Blanchard(Zanobia Griebel T. Eulanda Dorion MD; 05/19/2016 11:50 AM) The patient is a 40 year old female is here for LapBand followup. Janet Blanchard is a 40 year old patient of Dr. Johna SheriffHoxworth who received an AP-Standard lap band in July 2013. She was hospitalized about 2 weeks ago with the onset of acute epigastric and bilateral subcostal pain. This was associated initially with nausea but no vomiting. CT scan was obtained which showed some inflammatory change around the band and upper stomach without apparent leak or obstruction. She was treated with bowel rest and antibiotics and this improved. Subsequent upper endoscopy by Dr. Ezzard StandingNewman on April 07, 2016 showed no evidence of erosion. She has completed a course of Augmentin. Her pain is completely resolved. She did develop increased food intolerance and nausea after discharge and was seen here about a month ago and all fluid removed from her band. Her nausea and vomiting has now resolved. Overall she has struggled with weight loss with the band difficulty with adjustments despite her best efforts. She tends to be over restricted with reflux and vomiting or inadequate weight loss with multiple adjustments. With lack of success in these recent problems she desires to have her band removed which is reasonable. She has thought about having an immediate conversion but at this point does not feel she is mentally ready for this and would like to just have her band removed.     Problem List/Past Medical Janet Blanchard(Janet Blanchard T Janet Haser, MD; 05/19/2016 11:50 AM) MORBID OBESITY WITH BODY MASS INDEX OF 50.0-59.9 IN ADULT (E66.01) GASTRIC BANDING STATUS (Z98.84) NAUSEA (R11.0) ENCOUNTER FOR FITTING AND ADJUSTMENT OF GASTRIC LAP BAND (Z46.51)  Other Problems Janet Blanchard(Janet Blanchard T Arjan Strohm, MD; 05/19/2016 11:50 AM) Depression Back Pain Anxiety Disorder Gastroesophageal Reflux Disease Migraine Headache  Past Surgical History Janet Blanchard(Janet Blanchard T Demetrie Borge, MD; 05/19/2016  11:50 AM) Tonsillectomy Lap Band Oral Surgery Foot Surgery Right. Gallbladder Surgery - Laparoscopic  Diagnostic Studies History Janet Blanchard(Janet Blanchard T Alhassan Everingham, MD; 05/19/2016 11:50 AM) Colonoscopy 1-5 years ago Mammogram >3 years ago Pap Smear 1-5 years ago  Allergies Fay Records(Janet Blanchard, CMA; 05/19/2016 11:21 AM) SulfaSALAzine *GASTROINTESTINAL AGENTS - MISC.* Levofloxacin *CHEMICALS*  Medication History Fay Records(Janet Blanchard, CMA; 05/19/2016 11:21 AM) Zofran ODT (4MG  Tablet Disint, 1 (one) Tablet Oral every six hours, as needed, Taken starting 04/04/2016) Active. Norco (5-325MG  Tablet, 1 (one) Tablet Oral four times daily, as needed, Taken starting 04/05/2016) Active. ZyrTEC Allergy (10MG  Capsule, Oral) Active. Dexilant (60MG  Capsule DR, Oral) Active. Probiotic Acidophilus Beads (Oral) Active. Augmentin (875-125MG  Tablet, Oral) Active. BuSpar (10MG  Tablet, Oral) Active. PROzac (20MG  Capsule, Oral) Active. Ibuprofen (200MG  Capsule, Oral) Active. Tindamax (500MG  Tablet, Oral) Active. Xanax (0.5MG  Tablet, Oral) Active. Singulair (10MG  Tablet, Oral) Active. Zonegran (100MG  Capsule, Oral) Active. Medications Reconciled  Social History Janet Blanchard(Janet Blanchard T Yumiko Alkins, MD; 05/19/2016 11:50 AM) Alcohol use Moderate alcohol use, Occasional alcohol use. Caffeine use Carbonated beverages, Coffee, Tea. No drug use Tobacco use Never smoker.  Family History Janet Blanchard(Janet Blanchard T Jennette Leask, MD; 05/19/2016 11:50 AM) Hypertension Father, Mother. Alcohol Abuse Family Members In General. Depression Family Members In General, Mother. Diabetes Mellitus Mother. Migraine Headache Family Members In General. Cerebrovascular Accident Family Members In General.  Pregnancy / Birth History Janet Blanchard(Janet Blanchard T Claborn Janusz, MD; 05/19/2016 11:50 AM) Age at menarche 12 years. Contraceptive History Intrauterine device. Gravida 0 Irregular periods Para 0  Vitals Fay Records(Janet Blanchard CMA; 05/19/2016 11:22 AM) 05/19/2016 11:21  AM Weight: 307 lb Height: 62in Body Surface Area: 2.29 m Body Mass Index: 56.15 kg/m  Temp.: 8F(Temporal)  Pulse: 84 (Regular)  BP: 130/70 (Sitting, Left Arm, Standard)       Physical Exam Janet Blanchard T. Emmalene Kattner MD; 05/19/2016 11:51 AM) The physical exam findings are as follows: Note:General: Alert, morbidly obese African-American female, in no distress Skin: Warm and dry without rash or infection. HEENT: No palpable masses or thyromegaly. Sclera nonicteric. Pupils equal round and reactive. Oropharynx clear. Lymph nodes: No cervical, supraclavicular, or inguinal nodes palpable. Lungs: Breath sounds clear and equal. No wheezing or increased work of breathing. Cardiovascular: Regular rate and rhythm without murmer. No JVD or edema. Peripheral pulses intact. No carotid bruits. Abdomen: Nondistended. Soft and nontender. No masses palpable. No organomegaly. No palpable hernias. Port site noninflamed and nontender Extremities: No edema or joint swelling or deformity. No chronic venous stasis changes. Neurologic: Alert and fully oriented. Gait normal. No focal weakness. Psychiatric: Normal mood and affect. Thought content appropriate with normal judgement and insight   Recent upper endoscopy by Dr. Ezzard Standing showed no erosion or other complicating factors    Assessment & Plan Janet Blanchard T. Aahana Elza MD; 05/19/2016 11:53 AM) GASTRIC BANDING STATUS (Z98.84) Impression: Status post lap band 2013 with difficulty in adjustments and poor weight loss with total weight loss of 20 pounds. She now has developed an acute episode of inflammatory change around the stomach and band which has resolved with bowel rest and antibiotics. Etiology of this is really not clear. No evidence of erosion.  I discussed options going forward in detail with the patient. Because of this episode and because of the fact were not really sure why it happened or whether she would be at risk for further  complications and in light of inadequate weight loss with her band I think she would be best off having the band removed. Because of her ongoing significant morbid obesity and comorbidities of arthritis and moderate GERD I would certainly offer conversion to another procedure at the time of band removal for treatment of her morbid obesity. At this point she has decided not to have conversion at this time. I did discuss with her that if she is considering conversion in the future but I would recommend takedown of her plication at the time of band removal. She understands this and would like this done. Current Plans Removal of lap band with takedown of gastric plication under general anesthesia as an outpatient

## 2016-06-13 NOTE — Op Note (Signed)
Preoperative Diagnosis: Failed lap band  Postoprative Diagnosis: Same  Procedure: Procedure(s): LAPAROSCOPIC REMOVAL OF GASTRIC BAND AND TAKEDOWN OF GASTRIC PLICATION   Surgeon: Glenna FellowsHoxworth, Tykerria Mccubbins T   Assistants: Gaynelle AduEric Wilson  Anesthesia:  General endotracheal anesthesia  Indications: Patient has a history of a lap band placement several years ago. Despite repeated careful efforts at adjustment she has had recurrent over restriction with nausea and vomiting and reflux requiring all fluid removed from her band on several occasions and has not had significant weight loss. There is no evidence of slip or other anatomic problem on workup. After extensive discussion regarding options she has elected to have her lap band removed. She does not want revisional surgery at this time but we discussed taking down her plication at the time of lap band removal for possible future sleeve gastrectomy and she does want this done.    Procedure Detail:  Patient was brought to the operating room, placed in the supine position on the operating table, and general endotracheal anesthesia induced. She received preoperative IV antibiotics and subcutaneous heparin. PAS replaced. The abdomen was widely sterilely prepped and draped. The patient timeout was performed and correct procedure verified. Access was obtained with a 5 mm Optiview trocar in the left upper quadrant without difficulty and pneumoperitoneum established. There was no evidence of trocar injury. The small bowel and transverse colon were noted to be moderately distended and the patient apparently did require somewhat prolonged respiratory support with bagging prior to intubation to allow muscle relaxation. Under direct vision a 5 mm trocar was placed just above the left of the umbilicus for camera port. There were noted to be some adhesions of omentum near the transverse colon up around the band tubing entrance site. These were taken down with scissor cautery  and blunt dissection to release the viscera posteriorly. Under direct vision a 5 mm trocar was placed laterally in the right upper quadrant and a 15 mm trocar at the port site in the right mid abdomen. The patient had been placed in reverse Trendelenburg. Through a 5 mm subxiphoid site the [facility] was placed in the left lobe liver elevated. Some adhesions around the tubing up toward the band were sharply lysed. The band was identified and cicatrix overlying the buckle was divided with cautery. We dissected out along the plication upward the angle of Hiss taking part of this down. For better visualization we went ahead and removed the band at this point. The band was unbuckled and was then sharply divided at this midportion and removed from around the stomach. The 2 portions of the band and the tubing which had been divided near the abdominal wall were then removed through the 15 mm trocar site. With the band removed we could better see the previous suture line of the plication extending up toward the angle of Hiss and using careful sharp dissection this was completely taken down until the fundus was mobilized fully up to the angle of Hiss and the left crus could be visualized. At this point the stomach appeared to be restored to normal anatomy. The abdomen was inspected for hemostasis or injury and there are no signs of problems. The incision at the port site was extended slightly and using cautery the port and attached mesh were completely removed from the subcutaneous space along with the small remaining tubing attached to this. The fascial defect at this trocar site was closed with interrupted 0 Vicryl. A bilateral T AP block with Exparel had been performed  early in the procedure and this trocar site was also infiltrated with Exparel. Trochars were removed and all CO2 evacuated. The port site subcutaneous was closed with interrupted 2-0 Vicryl and all skin incisions closed with subcuticular 4-0  Monocryl and Liquiban. Sponge needle and instrument counts were correct.    Findings: As above  Estimated Blood Loss:  Minimal         Drains: None  Blood Given: none          Specimens: None        Complications:  * No complications entered in OR log *         Disposition: PACU - hemodynamically stable.         Condition: stable

## 2016-06-22 ENCOUNTER — Ambulatory Visit: Payer: Managed Care, Other (non HMO) | Admitting: Gynecology

## 2016-06-22 ENCOUNTER — Encounter: Payer: Self-pay | Admitting: Gynecology

## 2016-07-13 ENCOUNTER — Other Ambulatory Visit: Payer: Self-pay | Admitting: Gynecology

## 2016-07-13 ENCOUNTER — Encounter: Payer: Self-pay | Admitting: Gynecology

## 2016-07-13 ENCOUNTER — Ambulatory Visit (INDEPENDENT_AMBULATORY_CARE_PROVIDER_SITE_OTHER): Payer: Managed Care, Other (non HMO) | Admitting: Gynecology

## 2016-07-13 ENCOUNTER — Ambulatory Visit (INDEPENDENT_AMBULATORY_CARE_PROVIDER_SITE_OTHER): Payer: Managed Care, Other (non HMO)

## 2016-07-13 VITALS — BP 130/80 | Ht 62.0 in | Wt 309.0 lb

## 2016-07-13 DIAGNOSIS — Z8639 Personal history of other endocrine, nutritional and metabolic disease: Secondary | ICD-10-CM

## 2016-07-13 DIAGNOSIS — Z13820 Encounter for screening for osteoporosis: Secondary | ICD-10-CM | POA: Diagnosis not present

## 2016-07-13 DIAGNOSIS — M8580 Other specified disorders of bone density and structure, unspecified site: Secondary | ICD-10-CM | POA: Diagnosis not present

## 2016-07-13 DIAGNOSIS — Z13828 Encounter for screening for other musculoskeletal disorder: Secondary | ICD-10-CM

## 2016-07-13 DIAGNOSIS — Z30011 Encounter for initial prescription of contraceptive pills: Secondary | ICD-10-CM

## 2016-07-13 DIAGNOSIS — Z30432 Encounter for removal of intrauterine contraceptive device: Secondary | ICD-10-CM | POA: Diagnosis not present

## 2016-07-13 DIAGNOSIS — M859 Disorder of bone density and structure, unspecified: Secondary | ICD-10-CM

## 2016-07-13 NOTE — Progress Notes (Signed)
   Patient is a 40 year old that was seen in the office for her annual exam on August 2017. She's had a Mirena IUD and see her to have it removed. Review of her record indicated for many year she had been on Depo-Provera injection and she had a bone density study in 2011 whereby her Z-score  was -3.7 at the AP spine. Patient is on calcium and vitamin D. She is here also to discuss initiation of her oral contraceptive pill Janel 1/20 that we had discussed at last visit that I would like her to take continuously and withdrawal every 3 months. Her recent blood work and Pap smear were normal. She was here also to discuss her bone density study.  Bone density study at indicated that she had normal Z scores. She did have a bone density study in 2011 at another facility and the AP spine Z score had been as low as -3.7. I had explained to the patient that discontinue the Depo-Provera had reversed her bone mineralization.  She was then placed in the supine position and her Mirena IUD was removed. On pelvic exam: Bartholin urethra Skene was within normal limits Vagina: No lesions or discharge Cervix: No lesions or discharge IUD string was visualized it was grasped with a [place] clamp the IUD was retrieved discarded after shown to the patient.  Patient declined flu vaccine.  Patient otherwise scheduled to return back to the office for her annual exam in 1 year.  Greater than 50% the time was spent counseling correlating care for this patient reviewed her bone density study as well as discussing the initiation of the oral contraceptive pill. Risk benefits and pros and cons were discussed. Additional time spent 15 minutes

## 2017-02-21 ENCOUNTER — Encounter: Payer: Self-pay | Admitting: Gynecology

## 2017-03-17 LAB — BASIC METABOLIC PANEL: GLUCOSE: 83

## 2017-06-26 ENCOUNTER — Other Ambulatory Visit: Payer: Self-pay

## 2017-06-26 MED ORDER — NORETHIN ACE-ETH ESTRAD-FE 1-20 MG-MCG PO TABS: 1.0000 | ORAL_TABLET | Freq: Every day | ORAL | 0 refills | Status: DC

## 2017-08-01 ENCOUNTER — Encounter: Payer: Managed Care, Other (non HMO) | Admitting: Obstetrics & Gynecology

## 2017-08-01 DIAGNOSIS — Z0289 Encounter for other administrative examinations: Secondary | ICD-10-CM

## 2017-09-12 ENCOUNTER — Other Ambulatory Visit: Payer: Self-pay

## 2017-09-14 ENCOUNTER — Other Ambulatory Visit: Payer: Self-pay

## 2017-09-14 NOTE — Addendum Note (Signed)
Addended by: Keenan BachelorANNAS, Hieu Herms R on: 09/14/2017 12:20 PM   Modules accepted: Orders

## 2017-09-15 ENCOUNTER — Other Ambulatory Visit: Payer: Self-pay | Admitting: Gynecology

## 2017-10-19 ENCOUNTER — Ambulatory Visit (INDEPENDENT_AMBULATORY_CARE_PROVIDER_SITE_OTHER): Payer: 59 | Admitting: Gastroenterology

## 2017-10-19 ENCOUNTER — Encounter: Payer: Self-pay | Admitting: Gastroenterology

## 2017-10-19 VITALS — BP 118/84 | Ht 62.0 in | Wt 309.6 lb

## 2017-10-19 DIAGNOSIS — R159 Full incontinence of feces: Secondary | ICD-10-CM | POA: Diagnosis not present

## 2017-10-19 DIAGNOSIS — K589 Irritable bowel syndrome without diarrhea: Secondary | ICD-10-CM | POA: Diagnosis not present

## 2017-10-19 DIAGNOSIS — R194 Change in bowel habit: Secondary | ICD-10-CM | POA: Diagnosis not present

## 2017-10-19 MED ORDER — OMEPRAZOLE 40 MG PO CPDR: 40.0000 mg | DELAYED_RELEASE_CAPSULE | Freq: Every day | ORAL | 3 refills | Status: DC

## 2017-10-19 MED ORDER — DICYCLOMINE HCL 20 MG PO TABS: 20.0000 mg | ORAL_TABLET | Freq: Two times a day (BID) | ORAL | 5 refills | Status: DC | PRN

## 2017-10-19 NOTE — Patient Instructions (Signed)
If you are age 40 or older, your body mass index should be between 23-30. Your Body mass index is 56.63 kg/m. If this is out of the aforementioned range listed, please consider follow up with your Primary Care Provider.  If you are age 40 or younger, your body mass index should be between 19-25. Your Body mass index is 56.63 kg/m. If this is out of the aformentioned range listed, please consider follow up with your Primary Care Provider.   We have sent the following medications to your pharmacy for you to pick up at your convenience:  Bentyl  Omeprazole  Start daily powder Benefiber or Citrucel  You have been scheduled for a colonoscopy. Please follow written instructions given to you at your visit today.  Please pick up your prep supplies at the pharmacy within the next 1-3 days. If you use inhalers (even only as needed), please bring them with you on the day of your procedure. Your physician has requested that you go to www.[email or web address].com and enter the access code given to you at your visit today. This web site gives a general overview about your procedure. However, you should still follow specific instructions given to you by our office regarding your preparation for the procedure.  Thank you.

## 2017-10-19 NOTE — Progress Notes (Addendum)
10/19/2017 Janet Blanchard [id number] 04/21/1976   HISTORY OF PRESENT ILLNESS:  This is a pleasant 42 year old female who is known to Dr. Leone Payor several years ago for diagnosis of IBS.  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  Comes to our office today with several complaints.  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  Complains of change in bowel habits with alternating constipation and diarrhea/loose stools.  Says that sometimes she goes 2-3 times and day and they are loose stools and then other times she goes 2-3 days without a BM.  Says that she does see blood on the toilet paper when wiping intermittently.  Reports anal leakage when stools are loose.  Has a "fullness sensation" in her rectum at times like she has to move her bowels but then sometimes she doesn't.  She is asking for refills on her Bentyl, which she uses as needed for abdominal cramping related to her IBS as well as omeprazole.   Past Medical History:  Diagnosis Date  . Allergic rhinitis   . Anxiety   . Anxiety and depression   . Back pain   . Cervical dysplasia   . DDD (degenerative disc disease)    L4-S1 with chronic back pain  . Depression   . Frequency   . GERD (gastroesophageal reflux disease)   . Herniated disc    L 3-4, L 4-5, S1  . Hiatal hernia   . IBS (irritable bowel syndrome)   . IUD (intrauterine device) in place    Mirena remains in place  . Migraine headache    within a month 1-3 migraines  . Nocturia   . Obesity    BMI >54 ,s/p lap band 2013  . Osteopenia   . Sinusitis    sinus infection 4'17- no problems now-tx. antibiotic  . Vitamin D deficiency    improved   Past Surgical History:  Procedure Laterality Date  . ANKLE RECONSTRUCTION Right 09/20/2014   Procedure: LATERAL RECONSTRUCTION ANKLE RIGHT;  Surgeon: Sheral Apley, MD;   Location: [facility];  Service: Orthopedics;  Laterality: Right;  . CERVICAL BIOPSY  W/ LOOP ELECTRODE EXCISION    . CHOLECYSTECTOMY  1/04   laparoscopic  . COLONOSCOPY  09/07/2008   small internal hemorrhoids, otherwise normal (biopsies) into terminal ileum  . COLPOSCOPY    . COMPLEX WOUND CLOSURE Right 09/20/2014   Procedure: 7 CM COMPLEX WOUND CLOSURE;  Surgeon: Sheral Apley, MD;  Location: [facility];  Service: Orthopedics;  Laterality: Right;  . ESOPHAGOGASTRODUODENOSCOPY  7/07   hiatal hernia  . ESOPHAGOGASTRODUODENOSCOPY N/A 08/18/2013   Procedure: ESOPHAGOGASTRODUODENOSCOPY (EGD);  Surgeon: Iva Boop, MD;  Location: [facility];  Service: Endoscopy;  Laterality: N/A;  office called to get patient put on schedule becase as of 08/15/2013 PT wasn't booked   . ESOPHAGOGASTRODUODENOSCOPY (EGD) WITH PROPOFOL N/A 04/07/2016   Procedure: ESOPHAGOGASTRODUODENOSCOPY (EGD) WITH PROPOFOL;  Surgeon: Ovidio Kin, MD;  Location: [facility];  Service: General;  Laterality: N/A;  . I&D EXTREMITY Right 09/20/2014   Procedure: IRRIGATION AND DEBRIDEMENT OF OPEN WOUND  RIGHT FOOT AND TRAUMATIC ARTHROTOMY;  Surgeon: Sheral Apley, MD;  Location: [facility];  Service: Orthopedics;  Laterality: Right;  . LAPAROSCOPIC GASTRIC BANDING  04/09/2012   Procedure: LAPAROSCOPIC GASTRIC BANDING;  Surgeon: [name], MD;  Location: [facility];  Service: General;  Laterality: N/A;  . LEEP  2000  . NASAL TURBINATE REDUCTION  2005  . SEPTOPLASTY  2005  . SIGMOIDOSCOPY  7/07   ? colitis/proctitis - biopsies normal  . TONSILLECTOMY  2005  . WISDOM TOOTH EXTRACTION  2003    reports that  has never smoked. she has never used smokeless tobacco. She reports that she drinks alcohol. She reports that she does not use drugs. family history includes Cirrhosis in her maternal grandfather; Diabetes in her mother; Heart disease in her unknown relative; Hyperlipidemia in her maternal grandmother and mother; Hypertension in  her father, maternal grandfather, maternal grandmother, mother, paternal grandfather, and paternal grandmother; Kidney disease in her maternal grandfather; Uterine cancer in her maternal grandmother. Allergies  Allergen Reactions  . Levofloxacin Rash    Arms only.  . Sulfasalazine Rash    Elbows, knees, and three of her fingers only      Outpatient Encounter Medications as of 10/19/2017  Medication Sig  . ALPRAZolam (XANAX) 0.5 MG tablet Take 0.5 mg by mouth at bedtime as needed for anxiety or sleep.   Marland Kitchen amphetamine-dextroamphetamine (ADDERALL) 20 MG tablet Take 20 mg by mouth daily.  . baclofen (LIORESAL) 10 MG tablet Take 10 mg by mouth 2 (two) times daily as needed (migraines).   Marland Kitchen BIOTIN PO Take 5,000 mg by mouth daily.   Marland Kitchen bismuth subsalicylate (PEPTO BISMOL) 262 MG chewable tablet Chew 524 mg by mouth as needed.  . Black Cohosh 540 MG CAPS Take by mouth 2 (two) times daily.  . busPIRone (BUSPAR) 15 MG tablet Take 15 mg by mouth 2 (two) times daily.  . cetirizine (ZYRTEC) 10 MG tablet Take 10 mg by mouth every morning.   . cholecalciferol (VITAMIN D) 1000 units tablet Take 5,000 Units by mouth daily.   Marland Kitchen dicyclomine (BENTYL) 20 MG tablet Take 1 tablet (20 mg total) by mouth 2 (two) times daily as needed. (Patient taking differently: Take 20 mg by mouth 2 (two) times daily as needed for spasms. )  . diphenhydrAMINE (BENADRYL) 25 MG tablet Take 25 mg by mouth every 6 (six) hours as needed.  Marland Kitchen FLUoxetine (PROZAC) 40 MG capsule Take 40 mg by mouth every evening.   . fluticasone (FLONASE) 50 MCG/ACT nasal spray Place into both nostrils daily.  . Homeopathic Products (AZO YEAST PLUS PO) Take 1 tablet by mouth as needed (symptoms).  . Loperamide HCl (IMODIUM A-D PO) Take by mouth.  . meclizine (ANTIVERT) 25 MG tablet Take 25 mg by mouth as needed for dizziness.  . montelukast (SINGULAIR) 10 MG tablet Take 10 mg by mouth daily.   . norethindrone-ethinyl estradiol (JUNEL FE,GILDESS  FE,LOESTRIN FE) 1-20 MG-MCG tablet Take 1 tablet by mouth daily.  Marland Kitchen omeprazole (PRILOSEC) 20 MG capsule Take 20 mg by mouth daily.  . Probiotic Product (PROBIOTIC PO) Take 1 capsule by mouth daily.  . ranitidine (ZANTAC) 150 MG tablet Take 150 mg by mouth daily.   . simethicone (MYLICON) 125 MG chewable tablet Chew 125 mg by mouth every 6 (six) hours as needed for flatulence.  . zonisamide (ZONEGRAN) 100 MG capsule Take 200 mg by mouth at bedtime.  . [DISCONTINUED] omeprazole (PRILOSEC) 40 MG capsule Take 40 mg by mouth every evening.   . [DISCONTINUED] vitamin E 1000 UNIT capsule Take 1,000 Units by mouth daily. Currently out of  . [DISCONTINUED] zonisamide (ZONEGRAN) 50 MG capsule Take 50 mg by mouth daily. Patient takes with 200 mg to  equal 250 mg   Facility-Administered Encounter Medications as of 10/19/2017  Medication  . levonorgestrel (MIRENA) 20 MCG/24HR IUD     REVIEW OF SYSTEMS  : All other systems reviewed and negative except where noted in the History of Present Illness.   PHYSICAL EXAM: BP 118/84   Ht 5\' 2"  (1.575 m)   Wt (!) 309 lb 9.6 oz (140.4 kg)   BMI 56.63 kg/m  General: Well developed black female in no acute distress Head: Normocephalic and atraumatic Eyes:  Sclerae anicteric, conjunctiva pink. Ears: Normal auditory acuity Lungs: Clear throughout to auscultation; no increased WOB. Heart: Regular rate and rhythm; no M/R/G. Abdomen: Soft, non-distended.  BS present.  Non-tender. Rectal:  Will be done at the time of colonoscopy. Musculoskeletal: Symmetrical with no gross deformities  Skin: No lesions on visible extremities Extremities: No edema  Neurological: Alert oriented x 4, grossly non-focal Psychological:  Alert and cooperative. Normal mood and affect  ASSESSMENT AND PLAN: *42 year old female with change in bowel habits and some incontinence of feces:  Has history of IBS and proctitis.  I have asked her to begin taking a daily powder fiber supplement  such as Benefiber or Citrucel.  She will be scheduled colonoscopy with Dr. 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  Refill Bentyl.  **The risks, benefits, and alternatives to colonoscopy were discussed with the patient and she consents to proceed.   CC:  Assunta FoundGolding, John, MD  Agree with Ms. Rise MuZehr's management.  Iva Booparl E. Gessner, MD, Clementeen GrahamFACG

## 2017-10-26 ENCOUNTER — Encounter: Payer: Self-pay | Admitting: Gastroenterology

## 2017-10-26 DIAGNOSIS — R159 Full incontinence of feces: Secondary | ICD-10-CM | POA: Insufficient documentation

## 2017-10-26 DIAGNOSIS — R194 Change in bowel habit: Secondary | ICD-10-CM | POA: Insufficient documentation

## 2017-12-03 ENCOUNTER — Other Ambulatory Visit: Payer: Self-pay

## 2017-12-03 ENCOUNTER — Telehealth: Payer: Self-pay

## 2017-12-03 DIAGNOSIS — R109 Unspecified abdominal pain: Secondary | ICD-10-CM

## 2017-12-03 DIAGNOSIS — R159 Full incontinence of feces: Secondary | ICD-10-CM

## 2017-12-03 NOTE — Telephone Encounter (Signed)
Left message for pt to return call on Friday and today. Pt has been [facility] due to BMI. She was originally scheduled in [facility] by mistake. Pt has verbally been informed that she needs to be rescheduled. Pt needs to be made aware and instructions gone over for hospital procedure with Dr. Leone PayorGessner this Thursday 2/28. Instructions have been created. I have left pt a voicemail on her cell to call back to verbally go over them and to see if she can come by and pick them up because we do not have time to mail them.

## 2017-12-03 NOTE — Telephone Encounter (Signed)
Spoke with pt. She has been made aware of procedure date and time at the hospital. Instructions verbally reviewed. She will go and pick up Dulcolax, Gatorade and Miralax. Pt is unable to come by the office to pick up the instructions due to work. I faxed the instructions to pts fax at work [phone]

## 2017-12-04 ENCOUNTER — Other Ambulatory Visit: Payer: Self-pay

## 2017-12-04 ENCOUNTER — Encounter (HOSPITAL_COMMUNITY): Payer: Self-pay | Admitting: *Deleted

## 2017-12-06 ENCOUNTER — Encounter (HOSPITAL_COMMUNITY): Payer: Self-pay | Admitting: Emergency Medicine

## 2017-12-06 ENCOUNTER — Encounter (HOSPITAL_COMMUNITY)
Admission: RE | Disposition: A | Discharge status: Home / Self Care | Payer: Self-pay | Source: Ambulatory Visit | Attending: Internal Medicine

## 2017-12-06 ENCOUNTER — Ambulatory Visit (HOSPITAL_COMMUNITY)
Admission: RE | Admit: 2017-12-06 | Discharge: 2017-12-06 | Disposition: A | Discharge status: Home / Self Care | Payer: 59 | Source: Ambulatory Visit | Attending: Internal Medicine | Admitting: Internal Medicine

## 2017-12-06 ENCOUNTER — Ambulatory Visit (HOSPITAL_COMMUNITY): Payer: 59 | Admitting: Anesthesiology

## 2017-12-06 ENCOUNTER — Encounter: Payer: 59 | Admitting: Internal Medicine

## 2017-12-06 ENCOUNTER — Other Ambulatory Visit: Payer: Self-pay

## 2017-12-06 DIAGNOSIS — Z881 Allergy status to other antibiotic agents status: Secondary | ICD-10-CM | POA: Diagnosis not present

## 2017-12-06 DIAGNOSIS — Z6841 Body Mass Index (BMI) 40.0 and over, adult: Secondary | ICD-10-CM | POA: Diagnosis not present

## 2017-12-06 DIAGNOSIS — Z79890 Hormone replacement therapy (postmenopausal): Secondary | ICD-10-CM | POA: Insufficient documentation

## 2017-12-06 DIAGNOSIS — Z882 Allergy status to sulfonamides status: Secondary | ICD-10-CM | POA: Insufficient documentation

## 2017-12-06 DIAGNOSIS — K589 Irritable bowel syndrome without diarrhea: Secondary | ICD-10-CM | POA: Diagnosis not present

## 2017-12-06 DIAGNOSIS — K449 Diaphragmatic hernia without obstruction or gangrene: Secondary | ICD-10-CM | POA: Insufficient documentation

## 2017-12-06 DIAGNOSIS — R109 Unspecified abdominal pain: Secondary | ICD-10-CM | POA: Diagnosis not present

## 2017-12-06 DIAGNOSIS — F419 Anxiety disorder, unspecified: Secondary | ICD-10-CM | POA: Insufficient documentation

## 2017-12-06 DIAGNOSIS — R159 Full incontinence of feces: Secondary | ICD-10-CM | POA: Insufficient documentation

## 2017-12-06 DIAGNOSIS — E559 Vitamin D deficiency, unspecified: Secondary | ICD-10-CM | POA: Diagnosis not present

## 2017-12-06 DIAGNOSIS — G43909 Migraine, unspecified, not intractable, without status migrainosus: Secondary | ICD-10-CM | POA: Insufficient documentation

## 2017-12-06 DIAGNOSIS — R194 Change in bowel habit: Secondary | ICD-10-CM | POA: Insufficient documentation

## 2017-12-06 DIAGNOSIS — K219 Gastro-esophageal reflux disease without esophagitis: Secondary | ICD-10-CM | POA: Insufficient documentation

## 2017-12-06 HISTORY — PX: COLONOSCOPY WITH PROPOFOL: SHX5780

## 2017-12-06 HISTORY — DX: Dyspnea, unspecified: R06.00

## 2017-12-06 SURGERY — COLONOSCOPY WITH PROPOFOL
Anesthesia: Monitor Anesthesia Care

## 2017-12-06 MED ORDER — LACTATED RINGERS IV SOLN
INTRAVENOUS | Status: DC
  Administered 2017-12-06: 13:00:00 via INTRAVENOUS

## 2017-12-06 MED ORDER — PROPOFOL 10 MG/ML IV BOLUS
INTRAVENOUS | Status: AC
  Filled 2017-12-06: qty 20

## 2017-12-06 MED ORDER — ONDANSETRON HCL 4 MG/2ML IJ SOLN
INTRAMUSCULAR | Status: DC | PRN
  Administered 2017-12-06: 4 mg via INTRAVENOUS

## 2017-12-06 MED ORDER — PROPOFOL 10 MG/ML IV BOLUS
INTRAVENOUS | Status: AC
  Filled 2017-12-06: qty 40

## 2017-12-06 MED ORDER — PROPOFOL 500 MG/50ML IV EMUL
INTRAVENOUS | Status: DC | PRN
  Administered 2017-12-06: 140 ug/kg/min via INTRAVENOUS

## 2017-12-06 MED ORDER — LIDOCAINE 2% (20 MG/ML) 5 ML SYRINGE
INTRAMUSCULAR | Status: DC | PRN
  Administered 2017-12-06: 100 mg via INTRAVENOUS

## 2017-12-06 MED ORDER — SODIUM CHLORIDE 0.9 % IV SOLN: INTRAVENOUS | Status: DC

## 2017-12-06 MED ORDER — PROPOFOL 10 MG/ML IV BOLUS
INTRAVENOUS | Status: DC | PRN
  Administered 2017-12-06 (×5): 20 mg via INTRAVENOUS

## 2017-12-06 SURGICAL SUPPLY — 22 items

## 2017-12-06 NOTE — Anesthesia Preprocedure Evaluation (Signed)
Anesthesia Evaluation  Patient identified by MRN, date of birth, ID band Patient awake    Reviewed: Allergy & Precautions, H&P , NPO status , Patient's Chart, lab work & pertinent test results  Airway Mallampati: III  TM Distance: >3 FB Neck ROM: full    Dental no notable dental hx. (+) Teeth Intact, Dental Advisory Given   Pulmonary neg pulmonary ROS, shortness of breath,    Pulmonary exam normal breath sounds clear to auscultation       Cardiovascular Exercise Tolerance: Good negative cardio ROS Normal cardiovascular exam Rhythm:regular Rate:Normal     Neuro/Psych negative neurological ROS  negative psych ROS   GI/Hepatic Neg liver ROS, hiatal hernia, GERD  Medicated and Controlled,  Endo/Other  Morbid obesity  Renal/GU negative Renal ROS     Musculoskeletal  (+) Arthritis ,   Abdominal (+) + obese,   Peds  Hematology negative hematology ROS (+)   Anesthesia Other Findings   Reproductive/Obstetrics negative OB ROS                             Anesthesia Physical  Anesthesia Plan  ASA: IV  Anesthesia Plan: MAC   Post-op Pain Management:    Induction: Intravenous  PONV Risk Score and Plan: 2 and Propofol infusion and Ondansetron  Airway Management Planned:   Additional Equipment:   Intra-op Plan:   Post-operative Plan:   Informed Consent: I have reviewed the patients History and Physical, chart, labs and discussed the procedure including the risks, benefits and alternatives for the proposed anesthesia with the patient or authorized representative who has indicated his/her understanding and acceptance.   Dental advisory given  Plan Discussed with: CRNA  Anesthesia Plan Comments:         Anesthesia Quick Evaluation

## 2017-12-06 NOTE — Discharge Instructions (Signed)
° °  This is a normal colonoscopy. Your problems are related to irritable bowel syndrome.  We can discuss further at an office visit if desired.  I appreciate the opportunity to care for you. Iva Booparl E. Mel Tadros, MD, Clementeen GrahamFACG

## 2017-12-06 NOTE — Op Note (Signed)
[facility] Patient Name: Janet Blanchard Procedure Date: 12/06/2017 MRN: [id number] Attending MD: Iva Boop , MD Date of Birth: July 03, 1976 CSN: [id number] Age: 42 Admit Type: Outpatient Procedure:                Colonoscopy Indications:              Abdominal pain, Change in bowel habits, Fecal                            incontinence Providers:                Iva Boop, MD, Weston Settle, RN, Zoila Shutter, Technician, Leroy Libman, CRNA Referring MD:              Medicines:                Monitored Anesthesia Care Complications:            No immediate complications. Estimated Blood Loss:     Estimated blood loss: none. Procedure:                Pre-Anesthesia Assessment:                           - Prior to the procedure, a History and Physical                            was performed, and patient medications and                            allergies were reviewed. The patient's tolerance of                            previous anesthesia was also reviewed. The risks                            and benefits of the procedure and the sedation                            options and risks were discussed with the patient.                            All questions were answered, and informed consent                            was obtained. Prior Anticoagulants: The patient has                            taken no previous anticoagulant or antiplatelet                            agents. ASA Grade Assessment: III - A patient with  severe systemic disease. After reviewing the risks                            and benefits, the patient was deemed in                            satisfactory condition to undergo the procedure.                           After obtaining informed consent, the colonoscope                            was passed under direct vision. Throughout the                            procedure, the  patient's blood pressure, pulse, and                            oxygen saturations were monitored continuously. The                            EC-[id number] ([id number] scope was introduced through                            the anus and advanced to the the terminal ileum,                            with identification of the appendiceal orifice and                            IC valve. The colonoscopy was performed without                            difficulty. The patient tolerated the procedure                            well. The quality of the bowel preparation was                            adequate. The terminal ileum, ileocecal valve,                            appendiceal orifice, and rectum were photographed.                            The bowel preparation used was Miralax. Scope In: 1:51:08 PM Scope Out: 2:03:52 PM Total Procedure Duration: 0 hours 12 minutes 44 seconds  Findings:      The perianal and digital rectal examinations were normal.      The terminal ileum appeared normal.      The entire examined colon appeared normal on direct and retroflexion       views. Impression:               - The examined portion of the ileum was normal.                           -  The entire examined colon is normal on direct and                            retroflexion views.                           - No specimens collected.                           She has IBS Moderate Sedation:      N/A- Per Anesthesia Care Recommendation:           - Patient has a contact number available for                            emergencies. The signs and symptoms of potential                            delayed complications were discussed with the                            patient. Return to normal activities tomorrow.                            Written discharge instructions were provided to the                            patient.                           - Resume previous diet.                           -  Continue present medications.                           - No recommendation at this time regarding repeat                            colonoscopy due to young age.                           Consider FODMAPS diet, anorectal manometry, SIBO                            testing - she can schedule an OV w/ me to discuss Procedure Code(s):        --- Professional ---                           [phone], Colonoscopy, flexible; diagnostic, including                            collection of specimen(s) by brushing or washing,                            when performed (separate procedure) Diagnosis Code(s):        ---  Professional ---                           R10.9, Unspecified abdominal pain                           R19.4, Change in bowel habit                           R15.9, Full incontinence of feces CPT copyright 2016 [employer]. All rights reserved. The codes documented in this report are preliminary and upon coder review may  be revised to meet current compliance requirements. Iva Boop, MD 12/06/2017 2:25:31 PM This report has been signed electronically. Number of Addenda: 0

## 2017-12-06 NOTE — Anesthesia Procedure Notes (Signed)
Performed by: Johnathon Olden L, CRNA Oxygen Delivery Method: Simple face mask       

## 2017-12-06 NOTE — Transfer of Care (Signed)
Immediate Anesthesia Transfer of Care Note  Patient: KENTRELL GUETTLER  Procedure(s) Performed: COLONOSCOPY WITH PROPOFOL (N/A )  Patient Location: Endoscopy Unit  Anesthesia Type:MAC  Level of Consciousness: awake, alert  and oriented  Airway & Oxygen Therapy: Patient Spontanous Breathing and Patient connected to face mask oxygen  Post-op Assessment: Report given to RN and Post -op Vital signs reviewed and stable  Post vital signs: Reviewed and stable  Last Vitals:  Vitals:   12/06/17 1304  BP: (!) 111/94  Pulse: 85  Resp: 20  Temp: 36.8 C  SpO2: 100%    Last Pain:  Vitals:   12/06/17 1304  TempSrc: Oral         Complications: No apparent anesthesia complications

## 2017-12-06 NOTE — H&P (Signed)
[facility] History and Physical   Primary Care Physician:  Assunta FoundGolding, John, MD   Reason for Procedure:   evaluate change in bowel habits  Plan:    Colonoscopy - The risks and benefits as well as alternatives of endoscopic procedure(s) have been discussed and reviewed. All questions answered. The patient agrees to proceed.  HPI: Janet Blanchard is a 42 y.o. female here for colonoscopy due to change in bowel habits and fecal urgency with some leakage.    Past Medical History:  Diagnosis Date  . Allergic rhinitis   . Anxiety   . Anxiety and depression   . Back pain   . Cervical dysplasia   . DDD (degenerative disc disease)    L3-S1 with chronic back pain  . Depression   . Dyspnea    with exertion due to weight  . Frequency   . GERD (gastroesophageal reflux disease)   . Herniated disc    L 3-4, L 4-5, S1  . Hiatal hernia   . IBS (irritable bowel syndrome)   . Migraine headache    more than 3 a month  . Nocturia   . Obesity    BMI >54 ,s/p lap band 2013  . Osteopenia   . Sinusitis    sinus infection 4'17- no problems now-tx. antibiotic  . Vitamin D deficiency    improved    Past Surgical History:  Procedure Laterality Date  . ANKLE RECONSTRUCTION Right 09/20/2014   Procedure: LATERAL RECONSTRUCTION ANKLE RIGHT;  Surgeon: Sheral Apleyimothy D Murphy, MD;  [facility];  Service: Orthopedics;  Laterality: Right;  . CERVICAL BIOPSY  W/ LOOP ELECTRODE EXCISION    . CHOLECYSTECTOMY  1/04   laparoscopic  . COLONOSCOPY  09/07/2008   small internal hemorrhoids, otherwise normal (biopsies) into terminal ileum  . COLPOSCOPY    . COMPLEX WOUND CLOSURE Right 09/20/2014   Procedure: 7 CM COMPLEX WOUND CLOSURE;  Surgeon: Sheral Apleyimothy D Murphy, MD;  [facility];  Service: Orthopedics;  Laterality: Right;  . ESOPHAGOGASTRODUODENOSCOPY  7/07   hiatal hernia  . ESOPHAGOGASTRODUODENOSCOPY N/A 08/18/2013   Procedure: ESOPHAGOGASTRODUODENOSCOPY (EGD);  Surgeon: Iva Booparl E Gessner, MD;   [facility];  Service: Endoscopy;  Laterality: N/A;  office called to get patient put on schedule becase as of 08/15/2013 PT wasn't booked   . ESOPHAGOGASTRODUODENOSCOPY (EGD) WITH PROPOFOL N/A 04/07/2016   Procedure: ESOPHAGOGASTRODUODENOSCOPY (EGD) WITH PROPOFOL;  Surgeon: Ovidio Kinavid Newman, MD;  [facility];  Service: General;  Laterality: N/A;  . I&D EXTREMITY Right 09/20/2014   Procedure: IRRIGATION AND DEBRIDEMENT OF OPEN WOUND  RIGHT FOOT AND TRAUMATIC ARTHROTOMY;  Surgeon: Sheral Apleyimothy D Murphy, MD;  [facility];  Service: Orthopedics;  Laterality: Right;  . lap band removed  2017  . LAPAROSCOPIC GASTRIC BANDING  04/09/2012   Procedure: LAPAROSCOPIC GASTRIC BANDING;  Surgeon: Mariella SaaBenjamin T Hoxworth, MD;  Location: [facility];  Service: General;  Laterality: N/A;  . LEEP  2000  . NASAL TURBINATE REDUCTION  2005  . SEPTOPLASTY  2005  . SIGMOIDOSCOPY  7/07   ? colitis/proctitis - biopsies normal  . TONSILLECTOMY  2005  . WISDOM TOOTH EXTRACTION  2003    Prior to Admission medications   Medication Sig Start Date End Date Taking? Authorizing Provider  ALPRAZolam Prudy Feeler(XANAX) 0.5 MG tablet Take 0.5 mg by mouth at bedtime as needed for anxiety or sleep.    Yes [provider]  amphetamine-dextroamphetamine (ADDERALL) 20 MG tablet Take 20 mg by mouth daily.   Yes  [provider]  baclofen (LIORESAL) 10 MG tablet Take 10 mg by mouth 2 (two) times daily as needed (migraines).  01/07/13  Yes [provider]  BIOTIN PO Take 5,000 mg by mouth daily.    Yes [provider]  bismuth subsalicylate (PEPTO BISMOL) 262 MG chewable tablet Chew 524 mg by mouth as needed for indigestion.    Yes [provider]  Black Cohosh 540 MG CAPS Take 1 capsule by mouth 2 (two) times daily.    Yes [provider]  busPIRone (BUSPAR) 15 MG tablet Take 15 mg by mouth at bedtime.    Yes [provider]  cetirizine (ZYRTEC) 10 MG tablet Take 10 mg by mouth  every morning.    Yes [provider]  cholecalciferol (VITAMIN D) 1000 units tablet Take 1,000 Units by mouth daily.    Yes [provider]  dicyclomine (BENTYL) 20 MG tablet Take 1 tablet (20 mg total) by mouth 2 (two) times daily as needed. 10/19/17  Yes Zehr, Princella Pellegrini, PA-C  diphenhydrAMINE (BENADRYL) 25 MG tablet Take 25 mg by mouth every 6 (six) hours as needed.   Yes [provider]  FLUoxetine (PROZAC) 20 MG capsule Take 60 mg by mouth at bedtime.   Yes [provider]  fluticasone (FLONASE) 50 MCG/ACT nasal spray Place 2 sprays into both nostrils daily.    Yes [provider]  Homeopathic Products (AZO YEAST PLUS PO) Take 1 tablet by mouth as needed (symptoms).   Yes [provider]  Loperamide HCl (IMODIUM A-D PO) Take 1-4 tablets by mouth 2 (two) times daily as needed (constipation).    Yes [provider]  meclizine (ANTIVERT) 25 MG tablet Take 25 mg by mouth as needed for dizziness.   Yes [provider]  montelukast (SINGULAIR) 10 MG tablet Take 10 mg by mouth daily.    Yes [provider]  norethindrone-ethinyl estradiol (JUNEL FE,GILDESS FE,LOESTRIN FE) 1-20 MG-MCG tablet Take 1 tablet by mouth daily. Patient taking differently: Take 1 tablet by mouth daily. Continuously 06/26/17  Yes Fontaine, Nadyne Coombes, MD  omeprazole (PRILOSEC) 40 MG capsule Take 1 capsule (40 mg total) by mouth daily. 10/19/17  Yes Zehr, Princella Pellegrini, PA-C  Probiotic Product (PROBIOTIC PO) Take 1 capsule by mouth daily.   Yes [provider]  ranitidine (ZANTAC) 150 MG tablet Take 150 mg by mouth at bedtime.    Yes [provider]  simethicone (MYLICON) 125 MG chewable tablet Chew 125 mg by mouth every 6 (six) hours as needed for flatulence.   Yes [provider]  Turmeric 500 MG CAPS Take 1 capsule by mouth daily.   Yes [provider]  zonisamide (ZONEGRAN) 100 MG capsule Take 300 mg by mouth at  bedtime.    Yes [provider]    Current Facility-Administered Medications  Medication Dose Route Frequency Provider Last Rate Last Dose  . 0.9 %  sodium chloride infusion   Intravenous Continuous Zehr, Jessica D, PA-C      . lactated ringers infusion   Intravenous Continuous Iva Boop, MD 125 mL/hr at 12/06/17 1318      Allergies as of 11/30/2017 - Review Complete 10/26/2017  Allergen Reaction Noted  . Levofloxacin Rash 06/01/2008  . Sulfasalazine Rash 06/01/2008    Family History  Problem Relation Age of Onset  . Diabetes Mother   . Hypertension Mother   . Hyperlipidemia Mother   . Hypertension Father   . Kidney disease  Maternal Grandfather   . Cirrhosis Maternal Grandfather        alcoholic  . Hypertension Maternal Grandfather   . Heart disease Unknown        great uncle  . Uterine cancer Maternal Grandmother   . Hypertension Maternal Grandmother   . Hyperlipidemia Maternal Grandmother   . Hypertension Paternal Grandmother   . Hypertension Paternal Grandfather   . Colon cancer Neg Hx     Social History   Socioeconomic History  . Marital status: Single    Spouse name: Not on file  . Number of children: 0  . Years of education: Not on file  . Highest education level: Not on file  Social Needs  . Financial resource strain: Not on file  . Food insecurity - worry: Not on file  . Food insecurity - inability: Not on file  . Transportation needs - medical: Not on file  . Transportation needs - non-medical: Not on file  Occupational History  . Occupation: Teacher, adult education: Advertising copywriter  Tobacco Use  . Smoking status: Never Smoker  . Smokeless tobacco: Never Used  Substance and Sexual Activity  . Alcohol use: Yes    Alcohol/week: 0.0 oz    Comment: 3 glasses/week  . Drug use: No  . Sexual activity: Yes    Birth control/protection: None, IUD    Comment: Mirena inserted 05-10-12, intercourse age 25, sexual partners more than 5  Other Topics  Concern  . Not on file  Social History Narrative  . Not on file    Review of Systems:  All other review of systems negative except as mentioned in the HPI.  Physical Exam: Vital signs in last 24 hours: Temp:  [98.2 F (36.8 C)] 98.2 F (36.8 C) (02/28 1304) Pulse Rate:  [85] 85 (02/28 1304) Resp:  [20] 20 (02/28 1304) BP: (111)/(94) 111/94 (02/28 1304) SpO2:  [100 %] 100 % (02/28 1304) Weight:  [305 lb (138.3 kg)] 305 lb (138.3 kg) (02/28 1304)   General:   Alert,  Well-developed, well-nourished, pleasant and cooperative in NAD Lungs:  Clear throughout to auscultation.   Heart:  Regular rate and rhythm; no murmurs, clicks, rubs,  or gallops. Abdomen:  Soft, nontender and nondistended. Normal bowel sounds.   Neuro/Psych:  Alert and cooperative. Normal mood and affect. A and O x 3   @Carl  Sena Slate, MD, [facility] [phone] (pager) 12/06/2017 1:37 PM@

## 2017-12-07 NOTE — Anesthesia Postprocedure Evaluation (Signed)
Anesthesia Post Note  Patient: Janet Blanchard  Procedure(s) Performed: COLONOSCOPY WITH PROPOFOL (N/A )     Patient location during evaluation: PACU Anesthesia Type: MAC Level of consciousness: awake and alert Pain management: pain level controlled Vital Signs Assessment: post-procedure vital signs reviewed and stable Respiratory status: spontaneous breathing Cardiovascular status: stable Anesthetic complications: no    Last Vitals:  Vitals:   12/06/17 1440 12/06/17 1443  BP: 121/81   Pulse: 82 79  Resp: (!) 22 16  Temp:    SpO2: 100% 100%    Last Pain:  Vitals:   12/06/17 1413  TempSrc: Oral   Pain Goal:                 Nolon Nations

## 2017-12-18 ENCOUNTER — Encounter: Payer: 59 | Admitting: Internal Medicine

## 2018-01-23 ENCOUNTER — Other Ambulatory Visit: Payer: Self-pay | Admitting: Gastroenterology

## 2018-01-23 NOTE — Telephone Encounter (Signed)
According to the pharmacy pt have plenty of refills on med.

## 2018-07-23 ENCOUNTER — Telehealth: Payer: Self-pay | Admitting: Internal Medicine

## 2018-07-23 MED ORDER — FAMOTIDINE 20 MG PO TABS: 20.0000 mg | ORAL_TABLET | Freq: Every day | ORAL | 1 refills | Status: AC

## 2018-07-23 NOTE — Telephone Encounter (Signed)
Patient states that she stopped taking Zantac because of everything in the news and wants to know what can she start taking

## 2018-07-23 NOTE — Telephone Encounter (Signed)
Per our instructions I left Janet Blanchard a detailed message that we are switching her to pepcid 20mg  one daily at bedtime. I have sent this in for her.

## 2018-09-19 ENCOUNTER — Other Ambulatory Visit: Payer: Self-pay | Admitting: Gastroenterology

## 2018-10-06 ENCOUNTER — Other Ambulatory Visit: Payer: Self-pay | Admitting: Gastroenterology

## 2018-10-30 ENCOUNTER — Other Ambulatory Visit: Payer: Self-pay | Admitting: Gastroenterology

## 2018-11-19 ENCOUNTER — Encounter: Payer: Self-pay | Admitting: Internal Medicine

## 2018-11-19 ENCOUNTER — Telehealth: Payer: Self-pay | Admitting: Internal Medicine

## 2018-11-19 MED ORDER — OMEPRAZOLE 40 MG PO CPDR: DELAYED_RELEASE_CAPSULE | ORAL | 1 refills | Status: DC

## 2018-11-19 NOTE — Telephone Encounter (Signed)
Error

## 2018-11-19 NOTE — Telephone Encounter (Signed)
Omeprazole sent in as requested and appointment has been made.

## 2018-11-27 ENCOUNTER — Other Ambulatory Visit: Payer: Self-pay | Admitting: Gastroenterology

## 2018-12-12 ENCOUNTER — Encounter: Payer: Self-pay | Admitting: Internal Medicine

## 2018-12-13 ENCOUNTER — Encounter: Payer: Self-pay | Admitting: Internal Medicine

## 2018-12-13 ENCOUNTER — Ambulatory Visit (INDEPENDENT_AMBULATORY_CARE_PROVIDER_SITE_OTHER): Payer: 59 | Admitting: Internal Medicine

## 2018-12-13 VITALS — BP 112/78 | HR 96 | Ht 62.0 in | Wt 307.0 lb

## 2018-12-13 DIAGNOSIS — N816 Rectocele: Secondary | ICD-10-CM

## 2018-12-13 DIAGNOSIS — R159 Full incontinence of feces: Secondary | ICD-10-CM | POA: Diagnosis not present

## 2018-12-13 DIAGNOSIS — K219 Gastro-esophageal reflux disease without esophagitis: Secondary | ICD-10-CM

## 2018-12-13 DIAGNOSIS — M6289 Other specified disorders of muscle: Secondary | ICD-10-CM

## 2018-12-13 DIAGNOSIS — K580 Irritable bowel syndrome with diarrhea: Secondary | ICD-10-CM

## 2018-12-13 DIAGNOSIS — R152 Fecal urgency: Secondary | ICD-10-CM

## 2018-12-13 NOTE — Progress Notes (Signed)
Janet Blanchard 43 y.o. 12-16-1975 [id number]  Assessment & Plan:   Encounter Diagnoses  Name Primary?  . Irritable bowel syndrome with diarrhea Yes  . Incontinence of feces with fecal urgency   . Rectocele   . Pelvic floor dysfunction in female ?   . Gastroesophageal reflux disease, esophagitis presence not specified    Long-standing issues No IBD on multiple endoscopic evaluations Suspect weak anal sphincter and abnormal descent on rectal exam today.  I think SIBO is possible and we will test for that.Long-term PPI increases risk of that and could explain many of her sxs.  I also think pelvic PT could help - she is willing to go but reluctant. Will see what SIBO test tells [place] first.  Doubt rectocele is sig involved.  Does not have GU sxs.  Keep alosetron (? Onndansetron) in mind. Viberzi not an option s/p chole  Touched on diet issues today - keep in mind - food triggers very variable so ? How helpful but FODMAPS could be helpful - dietitian referral?  [id number], John, MD  I appreciate the opportunity to care for her. Iva Boop, MD, Clementeen Graham   Subjective:   Chief Complaint: IBS, Gas, GERD  HPI Cleve is here - last seen 12/2017 at colonoscopy for change in bowel habits, worsening urgent diarrhea and fecal incontinence - and that was normal. I recommended she return to office to review further. Presents today.   Over the years she has had a flex sig and a colonoscopy also - at one time Dr. Corinda Gubler raised ? Proctitis but biopsies then and subsequent rectal and colon bxs 2012 (me) all ok - normal.  She treats IBS-D with dicyclomine, loperamide and peptobismol and when she travles if she takes all 3 she can control her IBS. However at baseline she has incomplete defecation, fecal urgency and some incontinence and haphazard episodic urgent loose to watery bowel movements without clear triggers.  Hx of lap band  And then removal 2017 after it embedded and scarred into  outer gastric wall (did not perforate).  GERD Tx w/ omperazole - doing well.  She denies any urinary urgency, incontinence, other sxs.  Allergies  Allergen Reactions  . Levofloxacin Rash    Arms only.  . Sulfasalazine Rash    Elbows, knees, and three of her fingers only   Current Meds  Medication Sig  . ALPRAZolam (XANAX) 0.5 MG tablet Take 0.5 mg by mouth at bedtime as needed for anxiety or sleep.   Marland Kitchen amphetamine-dextroamphetamine (ADDERALL) 20 MG tablet Take 20 mg by mouth daily.  Marland Kitchen aspirin-acetaminophen-caffeine (EXCEDRIN MIGRAINE) 250-250-65 MG tablet Take 1-2 tablets by mouth as needed for headache.  . baclofen (LIORESAL) 10 MG tablet Take 10 mg by mouth 2 (two) times daily as needed (migraines).   Marland Kitchen BIOTIN PO Take 5,000 mg by mouth daily.   Marland Kitchen bismuth subsalicylate (PEPTO BISMOL) 262 MG chewable tablet Chew 524 mg by mouth as needed for indigestion.   . Black Cohosh 540 MG CAPS Take by mouth. 1 in morning  and 2 at night  . busPIRone (BUSPAR) 15 MG tablet Take 15 mg by mouth at bedtime.   . cetirizine (ZYRTEC) 10 MG tablet Take 10 mg by mouth every morning.   . cholecalciferol (VITAMIN D) 1000 units tablet Take 1,000 Units by mouth daily.   . diclofenac (VOLTAREN) 50 MG EC tablet Take 1 tablet by mouth as needed.  . dicyclomine (BENTYL) 20 MG tablet Take 1 tablet (20 mg  total) by mouth 2 (two) times daily as needed.  . diphenhydrAMINE (BENADRYL) 25 MG tablet Take 25 mg by mouth every 6 (six) hours as needed.  . famotidine (PEPCID) 20 MG tablet Take 1 tablet (20 mg total) by mouth at bedtime.  Marland Kitchen FLUoxetine (PROZAC) 20 MG capsule Take 40 mg by mouth at bedtime.   . fluticasone (FLONASE) 50 MCG/ACT nasal spray Place 2 sprays into both nostrils daily.   . Loperamide HCl (IMODIUM A-D PO) Take 1-4 tablets by mouth 2 (two) times daily as needed (constipation).   . meclizine (ANTIVERT) 25 MG tablet Take 25 mg by mouth as needed for dizziness.  . montelukast (SINGULAIR) 10 MG tablet Take  10 mg by mouth daily.   . norethindrone-ethinyl estradiol (JUNEL FE,GILDESS FE,LOESTRIN FE) 1-20 MG-MCG tablet Take 1 tablet by mouth daily. (Patient taking differently: Take 1 tablet by mouth daily. Continuously)  . omeprazole (PRILOSEC) 40 MG capsule TAKE 1 CAPSULE BY MOUTH EVERY DAY  . Probiotic Product (PROBIOTIC PO) Take 1 capsule by mouth daily.  . progesterone (PROMETRIUM) 200 MG capsule Take 1 capsule by mouth daily.  . simethicone (MYLICON) 125 MG chewable tablet Chew 125 mg by mouth every 6 (six) hours as needed for flatulence.  . Turmeric 500 MG CAPS Take 1 capsule by mouth daily.  Marland Kitchen zonisamide (ZONEGRAN) 100 MG capsule Take 350 mg by mouth at bedtime.    Past Medical History:  Diagnosis Date  . Allergic rhinitis   . Anxiety   . Anxiety and depression   . Back pain   . Cervical dysplasia   . DDD (degenerative disc disease)    L3-S1 with chronic back pain  . Depression   . Dyspnea    with exertion due to weight  . Frequency   . GERD (gastroesophageal reflux disease)   . Herniated disc    L 3-4, L 4-5, S1  . Hiatal hernia   . IBS (irritable bowel syndrome)   . Migraine headache    more than 3 a month  . Nocturia   . Obesity    BMI >54 ,s/p lap band 2013  . Osteopenia   . Sinusitis    sinus infection 4'17- no problems now-tx. antibiotic  . Vitamin D deficiency    improved   Past Surgical History:  Procedure Laterality Date  . ANKLE RECONSTRUCTION Right 09/20/2014   Procedure: LATERAL RECONSTRUCTION ANKLE RIGHT;  Surgeon: Sheral Apley, MD;  [facility];  Service: Orthopedics;  Laterality: Right;  . CERVICAL BIOPSY  W/ LOOP ELECTRODE EXCISION    . CHOLECYSTECTOMY  1/04   laparoscopic  . COLONOSCOPY  09/07/2008   small internal hemorrhoids, otherwise normal (biopsies) into terminal ileum  . COLONOSCOPY WITH PROPOFOL N/A 12/06/2017   Procedure: COLONOSCOPY WITH PROPOFOL;  Surgeon: Iva Boop, MD;  [facility];  Service: Endoscopy;   Laterality: N/A;  . COLPOSCOPY    . COMPLEX WOUND CLOSURE Right 09/20/2014   Procedure: 7 CM COMPLEX WOUND CLOSURE;  Surgeon: Sheral Apley, MD;  [facility];  Service: Orthopedics;  Laterality: Right;  . ESOPHAGOGASTRODUODENOSCOPY  7/07   hiatal hernia  . ESOPHAGOGASTRODUODENOSCOPY N/A 08/18/2013   Procedure: ESOPHAGOGASTRODUODENOSCOPY (EGD);  Surgeon: Iva Boop, MD;  [facility];  Service: Endoscopy;  Laterality: N/A;  office called to get patient put on schedule becase as of 08/15/2013 PT wasn't booked   . ESOPHAGOGASTRODUODENOSCOPY (EGD) WITH PROPOFOL N/A 04/07/2016   Procedure: ESOPHAGOGASTRODUODENOSCOPY (EGD) WITH PROPOFOL;  Surgeon: Onalee Hua  Ezzard Standing, MD;  [facility];  Service: General;  Laterality: N/A;  . I&D EXTREMITY Right 09/20/2014   Procedure: IRRIGATION AND DEBRIDEMENT OF OPEN WOUND  RIGHT FOOT AND TRAUMATIC ARTHROTOMY;  Surgeon: Sheral Apley, MD;  Location: [facility];  Service: Orthopedics;  Laterality: Right;  . lap band removed  2017  . LAPAROSCOPIC GASTRIC BANDING  04/09/2012   Procedure: LAPAROSCOPIC GASTRIC BANDING;  Surgeon: [name], MD;  [facility]: [facility];  Service: General;  Laterality: N/A;  . LEEP  2000  . NASAL TURBINATE REDUCTION  2005  . SEPTOPLASTY  2005  . SIGMOIDOSCOPY  7/07   ? colitis/proctitis - biopsies normal  . TONSILLECTOMY  2005  . WISDOM TOOTH EXTRACTION  2003   Social History   Social History Narrative  . Not on file   family history includes Cirrhosis in her maternal grandfather; Diabetes in her mother; Heart disease in an other family member; Hyperlipidemia in her maternal grandmother and mother; Hypertension in her father, maternal grandfather, maternal grandmother, mother, paternal grandfather, and paternal grandmother; Kidney disease in her maternal grandfather; Uterine cancer in her maternal grandmother.   Review of Systems As above  Objective:   Physical Exam BP 112/78 (BP Location: Left Arm,  Patient Position: Sitting, Cuff Size: Large)   Pulse 96   Ht  (1.575 m) Comment: height measured without shoes  Wt (!) 307 lb (139.3 kg)   BMI 56.15 kg/m  Obese NAD Eyes anicteric Pleasant affect Abdomen obese  Rectal  Tedra Senegal, CMA present   Anoderm inspection revealed no abnormalities Anal wink was + Digital exam revealed normal resting tone and suspected reduced voluntary squeeze. Small rectocele present. Simulated defecation with valsalva revealed appropriate abdominal contraction and increased descent.    Data reviewed see HPI

## 2018-12-13 NOTE — Patient Instructions (Signed)
You have been given a testing kit to check for small intestine bacterial overgrowth (SIBO) which is completed by a company named [employer]. Make sure to return your test in the mail using the return mailing label given you along with the kit. Your demographic and insurance information have already been sent to the company and they should be in contact with you over the next week regarding this test. Please keep in mind that you will be getting a call from phone number [phone] or a similar number. If you do not hear from them within this time frame, please call our office at [phone].     I appreciate the opportunity to care for you. Carl Gessner, MD, FACG    

## 2019-02-05 ENCOUNTER — Other Ambulatory Visit: Payer: Self-pay

## 2019-02-05 MED ORDER — OMEPRAZOLE 40 MG PO CPDR: DELAYED_RELEASE_CAPSULE | ORAL | 1 refills | Status: DC

## 2019-02-05 NOTE — Telephone Encounter (Signed)
Received a fax that her insurance requires a 90 day supply of her omeprazole so I resent that in as requested. Patient up to date on her office visits.

## 2019-02-21 ENCOUNTER — Ambulatory Visit: Admit: 2019-02-21 | Payer: 59 | Admitting: Obstetrics and Gynecology

## 2019-02-21 SURGERY — LIGATION, FALLOPIAN TUBE, LAPAROSCOPIC
Anesthesia: General | Laterality: Bilateral

## 2019-04-24 NOTE — Pre-Procedure Instructions (Addendum)
Janet Blanchard  04/24/2019    Your procedure is scheduled on Friday, July 24.  [facility] Main Entrance or Entrance "A" at 5:30 AM    Call this number if you have problems the morning of surgery: [phone]  This is the number for the Pre- Surgical Desk.                  For any other questions, please call [phone], Monday - Friday 8 AM - 4 PM.       Remember:  Do not eat  after midnight.  You may drink clear liquids until 4:30 AM .  Clear liquids allowed are:  Carbonated Beverages, Water, Clear Tea, Black Coffee only, Juice (non-citric and without pulp), Gatorade, Plain Jell-O  only, Plain Popsicles only .  Drink Pre- Surgery Ensure between 4:00 AM- 4:30 AM              Nothing to drink after midnight   Take these medicines the morning of surgery with A SIP OF WATER :  cetiriz ine (ZYRTEC)             norethindrone-ethinyl estradioline (ZYRTEC)                            Take if needed:              simethicone (MYLICON              meclizine (ANTIVERT)              dicyclomine (BENTYL)                1 Week prior to surgery STOP taking Aspirin, Aspirin Products (Goody Powder, Excedrin Migraine), Ibuprofen (Advil), Naproxen (Aleve), Vitamins and Herbal Products (ie Fish Oil).               Special instructions:   [place]- Preparing For Surgery  Before surgery, you can play an important role. Because skin is not sterile, your skin needs to be as free of germs as possible. You can reduce the number of germs on your skin by washing with CHG (chlorahexidine gluconate) Soap before surgery.  CHG is an antiseptic cleaner which kills germs and bonds with the skin to continue killing germs even after washing.    Oral Hygiene is also important to reduce your risk of infection.  Remember - BRUSH YOUR TEETH THE MORNING OF SURGERY WITH YOUR REGULAR TOOTHPASTE  Please do not use if you have an allergy to CHG or antibacterial soaps. If your skin becomes  reddened/irritated stop using the CHG.  Do not shave (including legs and underarms) for at least 48 hours prior to first CHG shower. It is OK to shave your face.  Please follow these instructions carefully.   1. Shower the NIGHT BEFORE SURGERY and the MORNING OF SURGERY with CHG.   2. If you chose to wash your hair, wash your hair first as usual with your normal shampoo.  3. After you shampoo, wash your face and private area with the soap you use at home, then rinse your hair and body thoroughly to remove the shampoo and soap.  4. Use CHG as you would any other liquid soap. You can apply CHG directly to the skin and wash gently with a scrungie or a clean washcloth.   5. Apply the CHG Soap to your body ONLY FROM THE NECK  DOWN.  Do not use on open wounds or open sores. Avoid contact with your eyes, ears, mouth and genitals (private parts)  6. Wash thoroughly, paying special attention to the area where your surgery will be performed.  7. Thoroughly rinse your body with warm water from the neck down.  8. DO NOT shower/wash with your normal soap after using and rinsing off the CHG Soap.  9. Pat yourself dry with a CLEAN TOWEL.  10. Wear CLEAN PAJAMAS to bed the night before surgery, wear comfortable clothes the morning of surgery  11. Place CLEAN SHEETS on your bed the night of your first shower and DO NOT SLEEP WITH PETS.  Day of Surgery: Shower as instructed above. Do not wear lotions, powders, or perfumes, or deodorant. Please wear clean clothes to the hospital/surgery center.   Remember to brush your teeth WITH YOUR REGULAR TOOTHPASTE.  Do not wear jewelry, make-up or nail polish.  Do not shave 48 hours prior to surgery.  Men may shave face and neck.  Do not bring valuables to the hospital.  [facility] is not responsible for any belongings or valuables.  Contacts, dentures or bridgework may not be worn into surgery.  Leave your suitcase in the car.  After surgery it may be  brought to your room.  For patients admitted to the hospital, discharge time will be determined by your treatment team.  Patients discharged the day of surgery will not be allowed to drive home.   Please read over the following fact sheets that you were given. Review  [place] - Preparing For Surgery, Coughing and Deep Breathing and Pain Booklet

## 2019-04-25 ENCOUNTER — Encounter (HOSPITAL_COMMUNITY): Payer: Self-pay

## 2019-04-25 ENCOUNTER — Other Ambulatory Visit: Payer: Self-pay

## 2019-04-25 ENCOUNTER — Encounter (HOSPITAL_COMMUNITY)
Admission: RE | Admit: 2019-04-25 | Discharge: 2019-04-25 | Disposition: A | Discharge status: Home / Self Care | Payer: 59 | Source: Ambulatory Visit | Attending: Obstetrics and Gynecology | Admitting: Obstetrics and Gynecology

## 2019-04-25 DIAGNOSIS — Z01812 Encounter for preprocedural laboratory examination: Secondary | ICD-10-CM | POA: Insufficient documentation

## 2019-04-25 HISTORY — DX: Post-traumatic stress disorder, unspecified: F43.10

## 2019-04-25 HISTORY — DX: Other intervertebral disc displacement, lumbar region: M51.26

## 2019-04-25 LAB — ABO/RH: ABO/RH(D): B POS

## 2019-04-25 LAB — CBC
HCT: 42.3 % (ref 36.0–46.0)
Hemoglobin: 13.1 g/dL (ref 12.0–15.0)
MCH: 28.2 pg (ref 26.0–34.0)
MCHC: 31 g/dL (ref 30.0–36.0)
MCV: 91 fL (ref 80.0–100.0)
Platelets: 389 10*3/uL (ref 150–400)
RBC: 4.65 MIL/uL (ref 3.87–5.11)
RDW: 12.9 % (ref 11.5–15.5)
WBC: 6.5 10*3/uL (ref 4.0–10.5)
nRBC: 0 % (ref 0.0–0.2)

## 2019-04-25 LAB — TYPE AND SCREEN
ABO/RH(D): B POS
Antibody Screen: NEGATIVE

## 2019-04-25 NOTE — Progress Notes (Signed)
PCP - Dr Sharilyn Sites  Cardiologist - no  Chest x-ray - na  EKG - na Stress Test - no  ECHO - no Cardiac Cath - no  Sleep Study - no CPAP - no  LABS- CBC, T/S  ASA- no  ERAS-yes- will not drink Pre- Surgery due to intolerance to artificial sweetners  HA1C-na Fasting Blood Sugar - na Checks Blood Sugar __na___ times a day  Anesthesia-  Pt denies having chest pain, sob, or fever at this time. All instructions explained to the pt, with a verbal understanding of the material. Pt agrees to go over the instructions while at home for a better understanding. Pt also instructed to self quarantine after being tested for COVID-19. The opportunity to ask questions was provided.

## 2019-04-29 ENCOUNTER — Other Ambulatory Visit (HOSPITAL_COMMUNITY)
Admission: RE | Admit: 2019-04-29 | Discharge: 2019-04-29 | Disposition: A | Discharge status: Home / Self Care | Payer: 59 | Source: Ambulatory Visit | Attending: Obstetrics and Gynecology | Admitting: Obstetrics and Gynecology

## 2019-04-29 DIAGNOSIS — Z1159 Encounter for screening for other viral diseases: Secondary | ICD-10-CM | POA: Insufficient documentation

## 2019-04-29 LAB — SARS CORONAVIRUS 2 (TAT 6-24 HRS): SARS Coronavirus 2: NEGATIVE

## 2019-05-01 NOTE — Anesthesia Preprocedure Evaluation (Addendum)
Anesthesia Evaluation  Patient identified by MRN, date of birth, ID band Patient awake    Reviewed: Allergy & Precautions, NPO status , Patient's Chart, lab work & pertinent test results  History of Anesthesia Complications Negative for: history of anesthetic complications  Airway Mallampati: II  TM Distance: >3 FB Neck ROM: Full    Dental no notable dental hx.    Pulmonary neg pulmonary ROS,    Pulmonary exam normal        Cardiovascular negative cardio ROS Normal cardiovascular exam     Neuro/Psych  Headaches, Anxiety Depression    GI/Hepatic Neg liver ROS, hiatal hernia, GERD  ,  Endo/Other  Morbid obesity  Renal/GU negative Renal ROS     Musculoskeletal  (+) Arthritis ,   Abdominal   Peds  Hematology negative hematology ROS (+)   Anesthesia Other Findings Day of surgery medications reviewed with the patient.  Reproductive/Obstetrics                            Anesthesia Physical Anesthesia Plan  ASA: III  Anesthesia Plan: General   Post-op Pain Management:    Induction: Intravenous  PONV Risk Score and Plan: 4 or greater and Treatment may vary due to age or medical condition, Ondansetron, Dexamethasone, Midazolam and Scopolamine patch - Pre-op  Airway Management Planned: Oral ETT  Additional Equipment:   Intra-op Plan:   Post-operative Plan: Extubation in OR  Informed Consent: I have reviewed the patients History and Physical, chart, labs and discussed the procedure including the risks, benefits and alternatives for the proposed anesthesia with the patient or authorized representative who has indicated his/her understanding and acceptance.     Dental advisory given  Plan Discussed with: CRNA  Anesthesia Plan Comments:        Anesthesia Quick Evaluation

## 2019-05-02 ENCOUNTER — Ambulatory Visit (HOSPITAL_COMMUNITY): Payer: No Typology Code available for payment source | Admitting: Vascular Surgery

## 2019-05-02 ENCOUNTER — Other Ambulatory Visit: Payer: Self-pay

## 2019-05-02 ENCOUNTER — Encounter (HOSPITAL_COMMUNITY): Payer: Self-pay | Admitting: Surgery

## 2019-05-02 ENCOUNTER — Ambulatory Visit (HOSPITAL_COMMUNITY)
Admission: RE | Admit: 2019-05-02 | Discharge: 2019-05-02 | Disposition: A | Discharge status: Home / Self Care | Payer: No Typology Code available for payment source | Attending: Obstetrics and Gynecology | Admitting: Obstetrics and Gynecology

## 2019-05-02 ENCOUNTER — Encounter (HOSPITAL_COMMUNITY)
Admission: RE | Disposition: A | Discharge status: Home / Self Care | Payer: Self-pay | Source: Home / Self Care | Attending: Obstetrics and Gynecology

## 2019-05-02 ENCOUNTER — Ambulatory Visit (HOSPITAL_COMMUNITY): Payer: No Typology Code available for payment source | Admitting: Anesthesiology

## 2019-05-02 DIAGNOSIS — G43909 Migraine, unspecified, not intractable, without status migrainosus: Secondary | ICD-10-CM | POA: Diagnosis not present

## 2019-05-02 DIAGNOSIS — M1990 Unspecified osteoarthritis, unspecified site: Secondary | ICD-10-CM | POA: Diagnosis not present

## 2019-05-02 DIAGNOSIS — Z793 Long term (current) use of hormonal contraceptives: Secondary | ICD-10-CM | POA: Diagnosis not present

## 2019-05-02 DIAGNOSIS — E559 Vitamin D deficiency, unspecified: Secondary | ICD-10-CM | POA: Diagnosis not present

## 2019-05-02 DIAGNOSIS — Z9884 Bariatric surgery status: Secondary | ICD-10-CM | POA: Diagnosis not present

## 2019-05-02 DIAGNOSIS — Z79899 Other long term (current) drug therapy: Secondary | ICD-10-CM | POA: Diagnosis not present

## 2019-05-02 DIAGNOSIS — Z302 Encounter for sterilization: Secondary | ICD-10-CM | POA: Insufficient documentation

## 2019-05-02 DIAGNOSIS — F329 Major depressive disorder, single episode, unspecified: Secondary | ICD-10-CM | POA: Insufficient documentation

## 2019-05-02 DIAGNOSIS — Z6841 Body Mass Index (BMI) 40.0 and over, adult: Secondary | ICD-10-CM | POA: Diagnosis not present

## 2019-05-02 DIAGNOSIS — K219 Gastro-esophageal reflux disease without esophagitis: Secondary | ICD-10-CM | POA: Insufficient documentation

## 2019-05-02 DIAGNOSIS — J309 Allergic rhinitis, unspecified: Secondary | ICD-10-CM | POA: Insufficient documentation

## 2019-05-02 HISTORY — PX: LAPAROSCOPIC TUBAL LIGATION: SHX1937

## 2019-05-02 LAB — POCT PREGNANCY, URINE: Preg Test, Ur: NEGATIVE

## 2019-05-02 SURGERY — LIGATION, FALLOPIAN TUBE, LAPAROSCOPIC
Anesthesia: General | Site: Abdomen | Laterality: Bilateral

## 2019-05-02 MED ORDER — HYDROMORPHONE HCL 1 MG/ML IJ SOLN
INTRAMUSCULAR | Status: AC
  Filled 2019-05-02: qty 1

## 2019-05-02 MED ORDER — BUPIVACAINE HCL (PF) 0.25 % IJ SOLN
INTRAMUSCULAR | Status: AC
  Filled 2019-05-02: qty 30

## 2019-05-02 MED ORDER — PHENYLEPHRINE 40 MCG/ML (10ML) SYRINGE FOR IV PUSH (FOR BLOOD PRESSURE SUPPORT)
PREFILLED_SYRINGE | INTRAVENOUS | Status: AC
  Filled 2019-05-02: qty 20

## 2019-05-02 MED ORDER — ONDANSETRON HCL 4 MG/2ML IJ SOLN
INTRAMUSCULAR | Status: DC | PRN
  Administered 2019-05-02: 4 mg via INTRAVENOUS

## 2019-05-02 MED ORDER — PROPOFOL 10 MG/ML IV BOLUS
INTRAVENOUS | Status: DC | PRN
  Administered 2019-05-02: 190 mg via INTRAVENOUS

## 2019-05-02 MED ORDER — OXYCODONE HCL 5 MG PO TABS
ORAL_TABLET | ORAL | Status: AC
  Filled 2019-05-02: qty 1

## 2019-05-02 MED ORDER — SUGAMMADEX SODIUM 200 MG/2ML IV SOLN
INTRAVENOUS | Status: DC | PRN
  Administered 2019-05-02: 300 mg via INTRAVENOUS

## 2019-05-02 MED ORDER — ACETAMINOPHEN 10 MG/ML IV SOLN
INTRAVENOUS | Status: AC
  Filled 2019-05-02: qty 100

## 2019-05-02 MED ORDER — SODIUM CHLORIDE (PF) 0.9 % IJ SOLN
INTRAMUSCULAR | Status: AC
  Filled 2019-05-02: qty 10

## 2019-05-02 MED ORDER — LIDOCAINE 2% (20 MG/ML) 5 ML SYRINGE
INTRAMUSCULAR | Status: AC
  Filled 2019-05-02: qty 5

## 2019-05-02 MED ORDER — CEFAZOLIN SODIUM 1 G IJ SOLR
INTRAMUSCULAR | Status: AC
  Filled 2019-05-02: qty 30

## 2019-05-02 MED ORDER — MIDAZOLAM HCL 2 MG/2ML IJ SOLN
INTRAMUSCULAR | Status: AC
  Filled 2019-05-02: qty 2

## 2019-05-02 MED ORDER — ONDANSETRON HCL 4 MG/2ML IJ SOLN
INTRAMUSCULAR | Status: AC
  Filled 2019-05-02: qty 2

## 2019-05-02 MED ORDER — ACETAMINOPHEN 325 MG PO TABS
650.0000 mg | ORAL_TABLET | Freq: Once | ORAL | Status: AC
  Administered 2019-05-02: 1000 mg via ORAL
  Filled 2019-05-02: qty 2

## 2019-05-02 MED ORDER — OXYCODONE HCL 5 MG PO TABS
5.0000 mg | ORAL_TABLET | Freq: Once | ORAL | Status: AC | PRN
  Administered 2019-05-02: 5 mg via ORAL

## 2019-05-02 MED ORDER — KETOROLAC TROMETHAMINE 30 MG/ML IJ SOLN
INTRAMUSCULAR | Status: AC
  Filled 2019-05-02: qty 1

## 2019-05-02 MED ORDER — ROCURONIUM BROMIDE 10 MG/ML (PF) SYRINGE
PREFILLED_SYRINGE | INTRAVENOUS | Status: AC
  Filled 2019-05-02: qty 10

## 2019-05-02 MED ORDER — LIDOCAINE 2% (20 MG/ML) 5 ML SYRINGE
INTRAMUSCULAR | Status: DC | PRN
  Administered 2019-05-02: 100 mg via INTRAVENOUS

## 2019-05-02 MED ORDER — MIDAZOLAM HCL 5 MG/5ML IJ SOLN
INTRAMUSCULAR | Status: DC | PRN
  Administered 2019-05-02: 2 mg via INTRAVENOUS

## 2019-05-02 MED ORDER — FENTANYL CITRATE (PF) 100 MCG/2ML IJ SOLN
25.0000 ug | INTRAMUSCULAR | Status: DC | PRN
  Administered 2019-05-02 (×2): 25 ug via INTRAVENOUS

## 2019-05-02 MED ORDER — SODIUM CHLORIDE 0.9 % IV SOLN: INTRAVENOUS | Status: DC | PRN

## 2019-05-02 MED ORDER — ROCURONIUM BROMIDE 10 MG/ML (PF) SYRINGE
PREFILLED_SYRINGE | INTRAVENOUS | Status: DC | PRN
  Administered 2019-05-02: 40 mg via INTRAVENOUS
  Administered 2019-05-02: 20 mg via INTRAVENOUS

## 2019-05-02 MED ORDER — FENTANYL CITRATE (PF) 100 MCG/2ML IJ SOLN
INTRAMUSCULAR | Status: DC | PRN
  Administered 2019-05-02: 150 ug via INTRAVENOUS

## 2019-05-02 MED ORDER — FENTANYL CITRATE (PF) 250 MCG/5ML IJ SOLN
INTRAMUSCULAR | Status: AC
  Filled 2019-05-02: qty 5

## 2019-05-02 MED ORDER — ARTIFICIAL TEARS OPHTHALMIC OINT
TOPICAL_OINTMENT | OPHTHALMIC | Status: AC
  Filled 2019-05-02: qty 3.5

## 2019-05-02 MED ORDER — PHENYLEPHRINE 40 MCG/ML (10ML) SYRINGE FOR IV PUSH (FOR BLOOD PRESSURE SUPPORT)
PREFILLED_SYRINGE | INTRAVENOUS | Status: DC | PRN
  Administered 2019-05-02: 120 ug via INTRAVENOUS
  Administered 2019-05-02 (×2): 80 ug via INTRAVENOUS
  Administered 2019-05-02: 120 ug via INTRAVENOUS

## 2019-05-02 MED ORDER — KETAMINE HCL 50 MG/5ML IJ SOSY
PREFILLED_SYRINGE | INTRAMUSCULAR | Status: AC
  Filled 2019-05-02: qty 5

## 2019-05-02 MED ORDER — ACETAMINOPHEN 500 MG PO TABS
ORAL_TABLET | ORAL | Status: AC
  Administered 2019-05-02: 1000 mg via ORAL
  Filled 2019-05-02: qty 2

## 2019-05-02 MED ORDER — FENTANYL CITRATE (PF) 100 MCG/2ML IJ SOLN
INTRAMUSCULAR | Status: AC
  Filled 2019-05-02: qty 2

## 2019-05-02 MED ORDER — DEXTROSE 5 % IV SOLN
INTRAVENOUS | Status: DC | PRN
  Administered 2019-05-02: 3 g via INTRAVENOUS

## 2019-05-02 MED ORDER — HYDROMORPHONE HCL 1 MG/ML IJ SOLN
1.0000 mg | Freq: Once | INTRAMUSCULAR | Status: AC | PRN
  Administered 2019-05-02 (×2): 1 mg via INTRAVENOUS

## 2019-05-02 MED ORDER — GLYCOPYRROLATE PF 0.2 MG/ML IJ SOSY
PREFILLED_SYRINGE | INTRAMUSCULAR | Status: AC
  Filled 2019-05-02: qty 1

## 2019-05-02 MED ORDER — LACTATED RINGERS IV SOLN
INTRAVENOUS | Status: DC
  Administered 2019-05-02: 07:00:00 via INTRAVENOUS

## 2019-05-02 MED ORDER — OXYCODONE HCL 5 MG/5ML PO SOLN: 5.0000 mg | Freq: Once | ORAL | Status: AC | PRN

## 2019-05-02 MED ORDER — BUPIVACAINE HCL (PF) 0.25 % IJ SOLN
INTRAMUSCULAR | Status: DC | PRN
  Administered 2019-05-02: 10 mL

## 2019-05-02 MED ORDER — SUCCINYLCHOLINE CHLORIDE 200 MG/10ML IV SOSY
PREFILLED_SYRINGE | INTRAVENOUS | Status: DC | PRN
  Administered 2019-05-02: 140 mg via INTRAVENOUS

## 2019-05-02 MED ORDER — PROPOFOL 10 MG/ML IV BOLUS
INTRAVENOUS | Status: AC
  Filled 2019-05-02: qty 40

## 2019-05-02 MED ORDER — SUCCINYLCHOLINE CHLORIDE 200 MG/10ML IV SOSY
PREFILLED_SYRINGE | INTRAVENOUS | Status: AC
  Filled 2019-05-02: qty 10

## 2019-05-02 MED ORDER — ACETAMINOPHEN 10 MG/ML IV SOLN: 1000.0000 mg | Freq: Once | INTRAVENOUS | Status: DC | PRN

## 2019-05-02 MED ORDER — DEXAMETHASONE SODIUM PHOSPHATE 10 MG/ML IJ SOLN
INTRAMUSCULAR | Status: AC
  Filled 2019-05-02: qty 1

## 2019-05-02 MED ORDER — PROMETHAZINE HCL 25 MG/ML IJ SOLN: 6.2500 mg | INTRAMUSCULAR | Status: DC | PRN

## 2019-05-02 SURGICAL SUPPLY — 24 items
ADH SKN CLS APL DERMABOND .7 (GAUZE/BANDAGES/DRESSINGS) ×1
CATH ROBINSON RED A/P 16FR (CATHETERS) ×3 IMPLANT
DERMABOND ADVANCED (GAUZE/BANDAGES/DRESSINGS) ×2
DERMABOND ADVANCED .7 DNX12 (GAUZE/BANDAGES/DRESSINGS) ×1 IMPLANT
DRSG OPSITE POSTOP 3X4 (GAUZE/BANDAGES/DRESSINGS) ×2 IMPLANT
DURAPREP 26ML APPLICATOR (WOUND CARE) ×3 IMPLANT
GLOVE BIO SURGEON STRL SZ 6.5 (GLOVE) ×2 IMPLANT
GLOVE BIO SURGEONS STRL SZ 6.5 (GLOVE) ×1
GOWN STRL REUS W/ TWL LRG LVL3 (GOWN DISPOSABLE) ×2 IMPLANT
GOWN STRL REUS W/TWL LRG LVL3 (GOWN DISPOSABLE) ×6
NDL INSUFFLATION 14GA 120MM (NEEDLE) ×1 IMPLANT
NEEDLE INSUFFLATION 14GA 120MM (NEEDLE) ×3 IMPLANT
PACK LAPAROSCOPY BASIN (CUSTOM PROCEDURE TRAY) ×3 IMPLANT
PACK TRENDGUARD 600 HYBRD PROC (MISCELLANEOUS) IMPLANT
PROTECTOR NERVE ULNAR (MISCELLANEOUS) ×6 IMPLANT
SET TUBE SMOKE EVAC HIGH FLOW (TUBING) ×3 IMPLANT
SLEEVE XCEL OPT CAN 5 100 (ENDOMECHANICALS) IMPLANT
SUT VIC AB 3-0 PS2 18 (SUTURE)
SUT VIC AB 3-0 PS2 18XBRD (SUTURE) IMPLANT
SUT VICRYL 0 UR6 27IN ABS (SUTURE) ×3 IMPLANT
TOWEL GREEN STERILE FF (TOWEL DISPOSABLE) ×6 IMPLANT
TRENDGUARD 600 HYBRID PROC PK (MISCELLANEOUS) ×3
TROCAR BALLN 12MMX100 BLUNT (TROCAR) ×2 IMPLANT
WARMER LAPAROSCOPE (MISCELLANEOUS) ×3 IMPLANT

## 2019-05-02 NOTE — Transfer of Care (Signed)
Immediate Anesthesia Transfer of Care Note  Patient: Janet Blanchard  Procedure(s) Performed: LAPAROSCOPIC TUBAL LIGATION (Bilateral Abdomen)  Patient Location: PACU  Anesthesia Type:General  Level of Consciousness: awake, alert , oriented and patient cooperative  Airway & Oxygen Therapy: Patient Spontanous Breathing and Patient connected to face mask oxygen  Post-op Assessment: Report given to RN and Post -op Vital signs reviewed and stable  Post vital signs: Reviewed and stable  Last Vitals:  Vitals Value Taken Time  BP 103/46 05/02/19 0901  Temp 36.1 C 05/02/19 0900  Pulse 87 05/02/19 0902  Resp 20 05/02/19 0902  SpO2 92 % 05/02/19 0902  Vitals shown include unvalidated device data.  Last Pain:  Vitals:   05/02/19 0637  PainSc: 0-No pain      Patients Stated Pain Goal: 1 (18/56/31 4970)  Complications: No apparent anesthesia complications

## 2019-05-02 NOTE — H&P (Signed)
43 year old female here for preop. Desires permanent sterilization. Medical history complicated by morbid obesity.  Past Medical History:  Diagnosis Date  . Allergic rhinitis   . Anxiety   . Anxiety and depression   . Back pain   . Cervical dysplasia   . DDD (degenerative disc disease)    L3-S1 with chronic back pain  . Depression   . Dyspnea    with exertion due to weight  . Frequency   . GERD (gastroesophageal reflux disease)   . Herniated disc    L 3-4, L 4-5, S1  . Hiatal hernia    repaired when lap band done  . HNP (herniated nucleus pulposus), lumbar    L3- S1  . IBS (irritable bowel syndrome)   . Migraine headache    more than 3 a month  . Nocturia   . Obesity    BMI >54 ,s/p lap band 2013  . Osteopenia   . Osteopenia   . PTSD (post-traumatic stress disorder)   . Sinusitis    sinus infection 4'17- no problems now-tx. antibiotic  . Vitamin D deficiency    improved   Past Surgical History:  Procedure Laterality Date  . ANKLE RECONSTRUCTION Right 09/20/2014   Procedure: LATERAL RECONSTRUCTION ANKLE RIGHT;  Surgeon: Sheral Apleyimothy D Murphy, MD;  Location: [facility] OR;  Service: Orthopedics;  Laterality: Right;  . CERVICAL BIOPSY  W/ LOOP ELECTRODE EXCISION    . CHOLECYSTECTOMY  1/04   laparoscopic  . COLONOSCOPY  09/07/2008   small internal hemorrhoids, otherwise normal (biopsies) into terminal ileum  . COLONOSCOPY WITH PROPOFOL N/A 12/06/2017   Procedure: COLONOSCOPY WITH PROPOFOL;  Surgeon: Iva BoopGessner, Carl E, MD;  Location: [facility];  Service: Endoscopy;  Laterality: N/A;  . COLPOSCOPY    . COMPLEX WOUND CLOSURE Right 09/20/2014   Procedure: 7 CM COMPLEX WOUND CLOSURE;  Surgeon: Sheral Apleyimothy D Murphy, MD;  Location: [facility] OR;  Service: Orthopedics;  Laterality: Right;  . ESOPHAGOGASTRODUODENOSCOPY  7/07   hiatal hernia  . ESOPHAGOGASTRODUODENOSCOPY N/A 08/18/2013   Procedure: ESOPHAGOGASTRODUODENOSCOPY (EGD);  Surgeon: Iva Booparl E Gessner, MD;  [facility];  Service:  Endoscopy;  Laterality: N/A;  office called to get patient put on schedule becase as of 08/15/2013 PT wasn't booked   . ESOPHAGOGASTRODUODENOSCOPY (EGD) WITH PROPOFOL N/A 04/07/2016   Procedure: ESOPHAGOGASTRODUODENOSCOPY (EGD) WITH PROPOFOL;  Surgeon: Ovidio Kinavid Newman, MD;  Location: [facility];  Service: General;  Laterality: N/A;  . Hiatial hernia  2013  . I&D EXTREMITY Right 09/20/2014   Procedure: IRRIGATION AND DEBRIDEMENT OF OPEN WOUND  RIGHT FOOT AND TRAUMATIC ARTHROTOMY;  Surgeon: Sheral Apleyimothy D Murphy, MD;  Location: [facility];  Service: Orthopedics;  Laterality: Right;  . lap band removed  2017  . LAPAROSCOPIC GASTRIC BANDING  04/09/2012   Procedure: LAPAROSCOPIC GASTRIC BANDING;  Surgeon: Mariella SaaBenjamin T Hoxworth, MD;  Location: [facility];  Service: General;  Laterality: N/A;  . LEEP  2000  . NASAL TURBINATE REDUCTION  2005  . SEPTOPLASTY  2005  . SIGMOIDOSCOPY  7/07   ? colitis/proctitis - biopsies normal  . TONSILLECTOMY  2005  . WISDOM TOOTH EXTRACTION  2003   Prior to Admission medications   Medication Sig Start Date End Date Taking? Authorizing Provider  ALPRAZolam Prudy Feeler(XANAX) 1 MG tablet Take 0.5-1 mg by mouth at bedtime.   Yes [provider]  amphetamine-dextroamphetamine (ADDERALL) 20 MG tablet Take 20 mg by mouth daily.   Yes [provider]  aspirin-acetaminophen-caffeine (EXCEDRIN MIGRAINE) [phone] MG tablet Take 1-2  tablets by mouth daily as needed for headache.    Yes [provider]  baclofen (LIORESAL) 10 MG tablet Take 10 mg by mouth 2 (two) times daily as needed (migraines).  01/07/13  Yes [provider]  BIOTIN PO Take 1 tablet by mouth daily.    Yes [provider]  bismuth subsalicylate (PEPTO BISMOL) 262 MG chewable tablet Chew 524 mg by mouth as needed for indigestion.    Yes [provider]  BLACK COHOSH EXTRACT PO Take 1-2 tablets by mouth See admin instructions. Take 1 tablet by mouth in the morning & take 2 tablets by mouth  at night   Taking 1 at hs- due to upcoming surgery   Yes [provider]  busPIRone (BUSPAR) 15 MG tablet Take 15 mg by mouth at bedtime.    Yes [provider]  cetirizine (ZYRTEC) 10 MG tablet Take 10 mg by mouth daily.    Yes [provider]  Cholecalciferol (VITAMIN D3 SUPER STRENGTH) 50 MCG (2000 UT) TABS Take 2,000 Units by mouth daily.   Yes [provider]  Cyanocobalamin (VITAMIN B-12 PO) Take 1 tablet by mouth daily.   Yes [provider]  diclofenac (VOLTAREN) 50 MG EC tablet Take 50 mg by mouth daily as needed (knee pain.).  10/05/18  Yes [provider]  dicyclomine (BENTYL) 20 MG tablet Take 1 tablet (20 mg total) by mouth 2 (two) times daily as needed. Patient taking differently: Take 20 mg by mouth 2 (two) times daily as needed (ibs).  10/19/17  Yes Zehr, Laban Emperor, PA-C  Diphenhydramine-Pseudoephed (BENADRYL ALLERGY/SINUS PO) Take 1-2 tablets by mouth 3 (three) times daily as needed (sinus headches.).   Yes [provider]  famotidine (PEPCID) 20 MG tablet Take 1 tablet (20 mg total) by mouth at bedtime. 07/23/18  Yes Gatha Mayer, MD  FLUoxetine (PROZAC) 20 MG capsule Take 40 mg by mouth at bedtime.    Yes [provider]  fluticasone (FLONASE) 50 MCG/ACT nasal spray Place 2 sprays into both nostrils daily.    Yes [provider]  Loperamide HCl (IMODIUM A-D PO) Take 1-4 tablets by mouth 2 (two) times daily as needed (diarrhea/loose stools).    Yes [provider]  meclizine (ANTIVERT) 25 MG tablet Take 25 mg by mouth 3 (three) times daily as needed for dizziness.    Yes [provider]  montelukast (SINGULAIR) 10 MG tablet Take 10 mg by mouth daily.    Yes [provider]  norethindrone-ethinyl estradiol (JUNEL FE,GILDESS FE,LOESTRIN FE) 1-20 MG-MCG tablet Take 1 tablet by mouth daily. Patient taking differently: Take 1 tablet by mouth daily. Continuously 06/26/17  Yes  Fontaine, Belinda Block, MD  omeprazole (PRILOSEC) 40 MG capsule TAKE 1 CAPSULE BY MOUTH EVERY DAY Patient taking differently: Take 40 mg by mouth daily.  02/05/19  Yes Gatha Mayer, MD  Probiotic Product (PROBIOTIC PO) Take 1 capsule by mouth daily.   Yes [provider]  progesterone (PROMETRIUM) 200 MG capsule Take 200 mg by mouth at bedtime.  11/28/18  Yes [provider]  simethicone (MYLICON) 938 MG chewable tablet Chew 125 mg by mouth every 6 (six) hours as needed for flatulence.   Yes [provider]  Turmeric 500 MG CAPS Take 1 capsule by mouth daily.   Yes [provider]  zonisamide (ZONEGRAN) 100 MG capsule Take 300 mg by mouth at bedtime. Total dose=350 mg   Yes [provider]  zonisamide (  ZONEGRAN) 50 MG capsule Take 50 mg by mouth at bedtime. Total dose=350 mg   Yes [provider]   Levofloxacin and Sulfasalazine  General alert and oriented Lung CTAB Car RRR Abdomen is soft and non tender Morbidly obese  IMPRESSION: Desires permanent sterilization PLAN: Open laparoscopy with Hassan trocar and BTL Risks reviewed Consent signed

## 2019-05-02 NOTE — Anesthesia Procedure Notes (Signed)
Procedure Name: Intubation Date/Time: 05/02/2019 7:54 AM Performed by: Cleda Daub, CRNA Pre-anesthesia Checklist: Patient identified, Emergency Drugs available, Suction available and Patient being monitored Patient Re-evaluated:Patient Re-evaluated prior to induction Oxygen Delivery Method: Circle system utilized Preoxygenation: Pre-oxygenation with 100% oxygen Induction Type: IV induction and Rapid sequence Ventilation: Mask ventilation without difficulty Laryngoscope Size: Miller and 2 Grade View: Grade I Tube type: Oral Tube size: 7.0 mm Number of attempts: 1 Airway Equipment and Method: Stylet Placement Confirmation: ETT inserted through vocal cords under direct vision,  positive ETCO2 and breath sounds checked- equal and bilateral Secured at: 20 cm Tube secured with: Tape Dental Injury: Teeth and Oropharynx as per pre-operative assessment

## 2019-05-02 NOTE — Brief Op Note (Signed)
05/02/2019  8:49 AM  PATIENT:  Janet Blanchard  43 y.o. female  PRE-OPERATIVE DIAGNOSIS:  Desires permanent sterilization  POST-OPERATIVE DIAGNOSIS:   Same  PROCEDURE:  Procedure(s) with comments: LAPAROSCOPIC TUBAL LIGATION (Bilateral) - Please have long Trocars available.  Please have longer Weighted Speculums Available and longer Vaginal Speculum  SURGEON:  Surgeon(s) and Role:    * Dian Queen, MD - Primary  PHYSICIAN ASSISTANT:   ASSISTANTS: none   ANESTHESIA:   general  EBL:  minimal   BLOOD ADMINISTERED:none  DRAINS: none   LOCAL MEDICATIONS USED:  LIDOCAINE   SPECIMEN:  No Specimen  DISPOSITION OF SPECIMEN:  N/A  COUNTS:  YES  TOURNIQUET:  * No tourniquets in log *  DICTATION: .Other Dictation: Dictation Number dictated  PLAN OF CARE: Discharge to home after PACU  PATIENT DISPOSITION:  PACU - hemodynamically stable.   Delay start of Pharmacological VTE agent (>24hrs) due to surgical blood loss or risk of bleeding: not applicable

## 2019-05-02 NOTE — Anesthesia Postprocedure Evaluation (Signed)
Anesthesia Post Note  Patient: Janet Blanchard  Procedure(s) Performed: LAPAROSCOPIC TUBAL LIGATION (Bilateral Abdomen)     Patient location during evaluation: PACU Anesthesia Type: General Level of consciousness: awake and alert Pain management: pain level controlled Vital Signs Assessment: post-procedure vital signs reviewed and stable Respiratory status: spontaneous breathing, nonlabored ventilation and respiratory function stable Cardiovascular status: blood pressure returned to baseline and stable Postop Assessment: no apparent nausea or vomiting Anesthetic complications: no    Last Vitals:  Vitals:   05/02/19 1000 05/02/19 1015  BP: 99/68 108/73  Pulse: 87 88  Resp: (!) 22 (!) 22  Temp:    SpO2: 94% 98%    Last Pain:  Vitals:   05/02/19 1015  PainSc: [place]

## 2019-05-02 NOTE — Addendum Note (Signed)
Addendum  created 05/02/19 1036 by Cleda Daub, CRNA   Intraprocedure Flowsheets edited

## 2019-05-03 ENCOUNTER — Encounter (HOSPITAL_COMMUNITY): Payer: Self-pay | Admitting: Obstetrics and Gynecology

## 2019-05-05 NOTE — Op Note (Signed)
NAME: [name], SUN MEDICAL RECORD [id number] ACCOUNT [id number] DATE OF BIRTH:08-Apr-1976 FACILITY: [facility] LOCATION: [facility] PHYSICIAN:Saia Derossett Lynett Fish, MD  OPERATIVE REPORT  DATE OF PROCEDURE:  05/02/2019  PREOPERATIVE DIAGNOSES:  Desires permanent sterilization and morbid obesity.  POSTOPERATIVE DIAGNOSES:  Desires permanent sterilization and morbid obesity.  PROCEDURE:  Laparoscopy and bilateral tubal ligation.  SURGEON:  Dian Queen, MD  ANESTHESIA:  General.  ESTIMATED BLOOD LOSS:  Minimal.  COMPLICATIONS:  None.  DESCRIPTION OF PROCEDURE:  The patient was taken to the operating room.  She was intubated in standard fashion.  She was prepped and draped.  An in and out catheter was used to empty the bladder and the uterine manipulator was inserted and we decided  because of her morbid obesity to proceed with open laparoscopy with the Hasson trocar.  A small incision was made and the umbilicus was carried down to the fascia.  There was significant subcutaneous tissue.  I elevated the fascia with Kocher clamps and  entered using Mayo scissors and entered the peritoneum using a hemostat.  I then inserted Hasson,  inflated the balloon and performed the pneumoperitoneum.  The trocar was inserted.  The patient was placed in Trendelenburg position.  Bowel appeared  normal.  Uterus was small.  Tubes were normal.  I then inserted the Kleppinger forceps and a bilateral tubal ligation was performed with a triple burn cautery method.  After this was performed,  the camera was removed.  The gas was released.  The Hasson  was removed.  The fascia was closed using 0 Vicryl suture.  The subcutaneous was closed with interrupted using 3-0 Vicryl and the skin was closed with a subcuticular using 3-0 Vicryl and Dermabond.  Dermabond was applied.  All sponge, lap and instrument  counts were correct x2.  All instruments were removed from the vagina.    The patient was extubated and went to  recovery room in stable condition.  AN/NUANCE  D:05/05/2019 T:05/05/2019 JOB:[id number]

## 2019-07-01 ENCOUNTER — Encounter: Payer: Self-pay | Admitting: Gynecology

## 2019-09-17 ENCOUNTER — Other Ambulatory Visit: Payer: Self-pay

## 2019-09-17 MED ORDER — OMEPRAZOLE 40 MG PO CPDR: DELAYED_RELEASE_CAPSULE | ORAL | 1 refills | Status: DC

## 2019-09-17 NOTE — Telephone Encounter (Signed)
Omeprazole capsules refilled as pharmacy requested. 

## 2020-02-25 ENCOUNTER — Other Ambulatory Visit: Payer: Self-pay | Admitting: Sports Medicine

## 2020-02-25 DIAGNOSIS — M5136 Other intervertebral disc degeneration, lumbar region: Secondary | ICD-10-CM

## 2020-02-25 DIAGNOSIS — M545 Low back pain, unspecified: Secondary | ICD-10-CM

## 2020-02-25 DIAGNOSIS — S83249S Other tear of medial meniscus, current injury, unspecified knee, sequela: Secondary | ICD-10-CM

## 2020-03-15 ENCOUNTER — Other Ambulatory Visit: Payer: Self-pay | Admitting: Internal Medicine

## 2020-03-26 ENCOUNTER — Other Ambulatory Visit: Payer: Self-pay

## 2020-03-26 ENCOUNTER — Ambulatory Visit
Admission: RE | Admit: 2020-03-26 | Discharge: 2020-03-26 | Disposition: A | Discharge status: Home / Self Care | Payer: No Typology Code available for payment source | Source: Ambulatory Visit | Attending: Sports Medicine | Admitting: Sports Medicine

## 2020-03-26 DIAGNOSIS — M5136 Other intervertebral disc degeneration, lumbar region: Secondary | ICD-10-CM

## 2020-03-26 DIAGNOSIS — M545 Low back pain, unspecified: Secondary | ICD-10-CM

## 2020-03-26 DIAGNOSIS — S83249S Other tear of medial meniscus, current injury, unspecified knee, sequela: Secondary | ICD-10-CM

## 2020-04-01 ENCOUNTER — Ambulatory Visit: Payer: No Typology Code available for payment source | Admitting: Physician Assistant

## 2020-09-16 ENCOUNTER — Encounter: Payer: Self-pay | Admitting: Nurse Practitioner

## 2020-09-16 ENCOUNTER — Ambulatory Visit (INDEPENDENT_AMBULATORY_CARE_PROVIDER_SITE_OTHER): Payer: No Typology Code available for payment source | Admitting: Nurse Practitioner

## 2020-09-16 ENCOUNTER — Other Ambulatory Visit: Payer: Self-pay | Admitting: Internal Medicine

## 2020-09-16 VITALS — BP 120/88 | HR 82 | Ht 62.0 in | Wt 322.0 lb

## 2020-09-16 DIAGNOSIS — R197 Diarrhea, unspecified: Secondary | ICD-10-CM | POA: Diagnosis not present

## 2020-09-16 DIAGNOSIS — R1013 Epigastric pain: Secondary | ICD-10-CM | POA: Diagnosis not present

## 2020-09-16 MED ORDER — DICYCLOMINE HCL 20 MG PO TABS
20.0000 mg | ORAL_TABLET | Freq: Two times a day (BID) | ORAL | 5 refills | Status: DC | PRN
Start: 2020-09-16 — End: 2021-03-04

## 2020-09-16 MED ORDER — SUCRALFATE 1 G PO TABS
1.0000 g | ORAL_TABLET | ORAL | 3 refills | Status: DC | PRN
Start: 2020-09-16 — End: 2021-01-27

## 2020-09-16 NOTE — Progress Notes (Signed)
ASSESSMENT AND PLAN    # 44 yo female with a two months of episodic, sharp, diffuse upper abdominal pain which associated passage of multiple loose stools. Loose stools not really new ( has IBS) but the pain is new. Bentyl doesn't help. Liver tests, lipase and CBC normal in [facility]  --The loose stool may be related to IBS but also a response to the severe upper abdominal pain. She has been taking daily Volaren since the summer so query PUD.  --Will schedule for EGD at hospital ( elevated BMI). The risks and benefits of EGD were discussed and the patient agrees to proceed.  --Discontinue, or at least significantly limit use of Diclofenac until time of EGD --Continue PPI, H2 blocker.    # GERD. Patient says she has always had silent reflux which was diagnosed by ENT. Howver she does complain of constant throat clearing despite am PPI and H2 blocker at night.     HISTORY OF PRESENT ILLNESS     Primary Gastroenterologist : Stan Head, MD  Chief Complaint :epigastric pain, diarrhea  Janet Blanchard is a 44 y.o. female with PMH / PSH significant for,  but not necessarily limited to: morbid obesity, IBS-D  Over the years she has had problems with diarrhea and associated fecal incontinence. She has had a flex sig and a colonoscopy . At one time Dr. Corinda Gubler raised ? Proctitis but biopsies then and subsequent rectal and colon bxs in 2012 were normal. She still gets intermittent diarrhea but not incontinence. She manages IBS-D flares with dicyclomine, loperamide and pepto bismol    Patient is here for evaluation of intermittent diffuse upper abdominal cramps > in LUQ.  When cramping occurs it is severe and generally followed by several BMs which are initially solid but subsequently becoming loose. Seen at Urgent Care in [facility] late September, got labs (she had lab results pulled up on phone). CMP, lipase and CBC were normal, no  diagnosis given. These episodes occur about twice a month since September and during the episodes bentyl doesn't help. No blood in stools. No dark stools. In between these episodes she feels okay and stool consistency varies from being soft to hard balls.  She had a normal complete colonoscopy in Feb 2019 for abdominal pain and bowel changes.     Past Medical History:  Diagnosis Date   Allergic rhinitis    Anxiety    Anxiety and depression    Back pain    Cervical dysplasia    DDD (degenerative disc disease)    L3-S1 with chronic back pain   Depression    Dyspnea    with exertion due to weight   Frequency    GERD (gastroesophageal reflux disease)    Herniated disc    L 3-4, L 4-5, S1   Hiatal hernia    repaired when lap band done   HNP (herniated nucleus pulposus), lumbar    L3- S1   IBS (irritable bowel syndrome)    Migraine headache    more than 3 a month   Nocturia    Obesity    BMI >54 ,s/p lap band 2013   Osteopenia    Osteopenia    PTSD (post-traumatic stress disorder)    Sinusitis    sinus infection 4'17- no problems now-tx. antibiotic   Vitamin D deficiency    improved    Current Medications, Allergies, Past Surgical History, Family History and Social History  were reviewed in [facility] electronic medical record.   Current Outpatient Medications  Medication Sig Dispense Refill   ALPRAZolam (XANAX) 1 MG tablet Take 0.5-1 mg by mouth at bedtime.     amphetamine-dextroamphetamine (ADDERALL) 20 MG tablet Take 20 mg by mouth daily.     aspirin-acetaminophen-caffeine (EXCEDRIN MIGRAINE) 250-250-65 MG tablet Take 1-2 tablets by mouth daily as needed for headache.      baclofen (LIORESAL) 10 MG tablet Take 10 mg by mouth 2 (two) times daily as needed (migraines).      BIOTIN PO Take 1 tablet by mouth daily.      bismuth subsalicylate (PEPTO BISMOL) 262 MG chewable tablet Chew 524 mg by mouth as needed for indigestion.      BLACK  COHOSH EXTRACT PO Take 1-2 tablets by mouth See admin instructions. Take 1 tablet by mouth in the morning & take 2 tablets by mouth at night   Taking 1 at hs- due to upcoming surgery     cetirizine (ZYRTEC) 10 MG tablet Take 10 mg by mouth daily.      Cholecalciferol (VITAMIN D3 SUPER STRENGTH) 50 MCG (2000 UT) TABS Take 2,000 Units by mouth daily.     Cyanocobalamin (VITAMIN B-12 PO) Take 1 tablet by mouth daily.     diclofenac (VOLTAREN) 50 MG EC tablet Take 50 mg by mouth daily as needed (knee pain.).      dicyclomine (BENTYL) 20 MG tablet Take 1 tablet (20 mg total) by mouth 2 (two) times daily as needed. (Patient taking differently: Take 20 mg by mouth 2 (two) times daily as needed (ibs).) 60 tablet 5   Diphenhydramine-Pseudoephed (BENADRYL ALLERGY/SINUS PO) Take 1-2 tablets by mouth 3 (three) times daily as needed (sinus headches.).     DULoxetine (CYMBALTA) 60 MG capsule Take 60 mg by mouth daily.     famotidine (PEPCID) 20 MG tablet Take 1 tablet (20 mg total) by mouth at bedtime. 90 tablet 1   FLUoxetine (PROZAC) 20 MG capsule Take 40 mg by mouth at bedtime.      fluticasone (FLONASE) 50 MCG/ACT nasal spray Place 2 sprays into both nostrils daily.      Loperamide HCl (IMODIUM A-D PO) Take 1-4 tablets by mouth 2 (two) times daily as needed (diarrhea/loose stools).      meclizine (ANTIVERT) 25 MG tablet Take 25 mg by mouth 3 (three) times daily as needed for dizziness.      montelukast (SINGULAIR) 10 MG tablet Take 10 mg by mouth daily.      omeprazole (PRILOSEC) 40 MG capsule TAKE 1 CAPSULE BY MOUTH EVERY DAY 90 capsule 1   ondansetron (ZOFRAN-ODT) 8 MG disintegrating tablet Take 8 mg by mouth as needed.     Probiotic Product (PROBIOTIC PO) Take 1 capsule by mouth daily.     progesterone (PROMETRIUM) 200 MG capsule Take 200 mg by mouth at bedtime.      simethicone (MYLICON) 125 MG chewable tablet Chew 125 mg by mouth every 6 (six) hours as needed for flatulence.      sucralfate (CARAFATE) 1 g tablet Take 1 g by mouth as needed.     Turmeric 500 MG CAPS Take 1 capsule by mouth daily.     zonisamide (ZONEGRAN) 100 MG capsule Take 300 mg by mouth at bedtime. Total dose=350 mg     zonisamide (ZONEGRAN) 50 MG capsule Take 50 mg by mouth at bedtime. Total dose=350 mg     No current facility-administered medications for this visit.  Review of Systems: No chest pain. No shortness of breath. No urinary complaints.   PHYSICAL EXAM :    Wt Readings from Last 3 Encounters:  09/16/20 (!) 322 lb (146.1 kg)  04/25/19 (!) 323 lb 3.2 oz (146.6 kg)  12/13/18 (!) 307 lb (139.3 kg)    BP 120/88    Pulse 82    Ht 5\' 2"  (1.575 m)    Wt (!) 322 lb (146.1 kg)    BMI 58.89 kg/m  Constitutional:  Pleasant female in no acute distress. Psychiatric: Normal mood and affect. Behavior is normal. EENT: Pupils normal.  Conjunctivae are normal. No scleral icterus. Neck supple.  Cardiovascular: Normal rate, regular rhythm. No edema Pulmonary/chest: Effort normal and breath sounds normal. No wheezing, rales or rhonchi. Abdominal: Soft, nondistended, nontender. Bowel sounds active throughout. There are no masses palpable. No hepatomegaly. Neurological: Alert and oriented to person place and time. Skin: Skin is warm and dry. No rashes noted.  , NP  09/16/2020, 10:40 AM

## 2020-09-16 NOTE — Patient Instructions (Addendum)
If you are age 42 or older, your body mass index should be between 23-30. Your Body mass index is 58.89 kg/m. If this is out of the aforementioned range listed, please consider follow up with your Primary Care Provider.  If you are age 47 or younger, your body mass index should be between 19-25. Your Body mass index is 58.89 kg/m. If this is out of the aformentioned range listed, please consider follow up with your Primary Care Provider.   You have been scheduled for an endoscopy. Please follow written instructions given to you at your visit today. If you use inhalers (even only as needed), please bring them with you on the day of your procedure.  Try to hold your Diclofenac or take sparingly until you have your Endoscopy.  Follow up pending the results of your Endoscopy or as needed.  Thank you for entrusting me with your care and choosing [facility].  Willette Cluster, NP

## 2020-09-17 ENCOUNTER — Encounter: Payer: Self-pay | Admitting: Nurse Practitioner

## 2020-09-21 ENCOUNTER — Telehealth: Payer: Self-pay | Admitting: Internal Medicine

## 2020-09-21 NOTE — Telephone Encounter (Signed)
Patient requesting to reschedule her [facility] procedure

## 2020-09-22 NOTE — Telephone Encounter (Signed)
Patient has been rescheduled to 11/29/20 7:30.  She is aware and verbalized understanding to arrive at 6:00 am for 7:30 case

## 2020-09-30 ENCOUNTER — Other Ambulatory Visit (HOSPITAL_COMMUNITY): Payer: No Typology Code available for payment source

## 2020-11-16 NOTE — Telephone Encounter (Signed)
Patient called is asking when she should go for the Covid test.

## 2020-11-16 NOTE — Telephone Encounter (Signed)
Called patient and gave COVID appt. 11/25/20 @ 8:25am and location; [address] [place]. Let her know she needed to quarantine from testing until procedure on 11/29/20

## 2020-11-22 ENCOUNTER — Other Ambulatory Visit: Payer: Self-pay

## 2020-11-25 ENCOUNTER — Other Ambulatory Visit (HOSPITAL_COMMUNITY): Payer: No Typology Code available for payment source

## 2020-11-25 ENCOUNTER — Other Ambulatory Visit (HOSPITAL_COMMUNITY)
Admission: RE | Admit: 2020-11-25 | Discharge: 2020-11-25 | Disposition: A | Discharge status: Home / Self Care | Payer: No Typology Code available for payment source | Source: Ambulatory Visit | Attending: Internal Medicine | Admitting: Internal Medicine

## 2020-11-25 DIAGNOSIS — Z01812 Encounter for preprocedural laboratory examination: Secondary | ICD-10-CM | POA: Diagnosis not present

## 2020-11-25 DIAGNOSIS — Z20822 Contact with and (suspected) exposure to covid-19: Secondary | ICD-10-CM | POA: Insufficient documentation

## 2020-11-25 LAB — SARS CORONAVIRUS 2 (TAT 6-24 HRS): SARS Coronavirus 2: NEGATIVE

## 2020-11-26 NOTE — Progress Notes (Signed)
Pre op call done for endo procedure on 2/21. Patient states she has been quarantined since covid test and will stay so over weekend. Patient states she will be NPO morning of and confirmed she has a ride home post procedure. All questions addressed.

## 2020-11-27 NOTE — Anesthesia Preprocedure Evaluation (Addendum)
Anesthesia Evaluation  Patient identified by MRN, date of birth, ID band Patient awake    Reviewed: Allergy & Precautions, NPO status , Patient's Chart, lab work & pertinent test results  Airway Mallampati: II  TM Distance: >3 FB Neck ROM: Full    Dental  (+) Teeth Intact, Dental Advisory Given   Pulmonary    breath sounds clear to auscultation       Cardiovascular  Rhythm:Regular Rate:Normal     Neuro/Psych  Headaches, PSYCHIATRIC DISORDERS Anxiety Depression  Neuromuscular disease    GI/Hepatic Neg liver ROS, hiatal hernia, GERD  Medicated,  Endo/Other  negative endocrine ROS  Renal/GU negative Renal ROS     Musculoskeletal  (+) Arthritis ,   Abdominal (+) + obese,   Peds  Hematology negative hematology ROS (+)   Anesthesia Other Findings   Reproductive/Obstetrics                            Anesthesia Physical Anesthesia Plan  ASA: III  Anesthesia Plan: MAC   Post-op Pain Management:    Induction: Intravenous  PONV Risk Score and Plan: 0 and Propofol infusion  Airway Management Planned: Natural Airway and Simple Face Mask  Additional Equipment: None  Intra-op Plan:   Post-operative Plan:   Informed Consent: I have reviewed the patients History and Physical, chart, labs and discussed the procedure including the risks, benefits and alternatives for the proposed anesthesia with the patient or authorized representative who has indicated his/her understanding and acceptance.       Plan Discussed with: CRNA  Anesthesia Plan Comments:        Anesthesia Quick Evaluation

## 2020-11-29 ENCOUNTER — Ambulatory Visit (HOSPITAL_COMMUNITY): Payer: No Typology Code available for payment source | Admitting: Anesthesiology

## 2020-11-29 ENCOUNTER — Ambulatory Visit (HOSPITAL_COMMUNITY)
Admission: RE | Admit: 2020-11-29 | Discharge: 2020-11-29 | Disposition: A | Discharge status: Home / Self Care | Payer: No Typology Code available for payment source | Attending: Internal Medicine | Admitting: Internal Medicine

## 2020-11-29 ENCOUNTER — Other Ambulatory Visit: Payer: Self-pay

## 2020-11-29 ENCOUNTER — Encounter (HOSPITAL_COMMUNITY)
Admission: RE | Disposition: A | Discharge status: Home / Self Care | Payer: Self-pay | Source: Home / Self Care | Attending: Internal Medicine

## 2020-11-29 ENCOUNTER — Encounter (HOSPITAL_COMMUNITY): Payer: Self-pay | Admitting: Internal Medicine

## 2020-11-29 DIAGNOSIS — Z8379 Family history of other diseases of the digestive system: Secondary | ICD-10-CM | POA: Diagnosis not present

## 2020-11-29 DIAGNOSIS — Z79899 Other long term (current) drug therapy: Secondary | ICD-10-CM | POA: Insufficient documentation

## 2020-11-29 DIAGNOSIS — Z9049 Acquired absence of other specified parts of digestive tract: Secondary | ICD-10-CM | POA: Insufficient documentation

## 2020-11-29 DIAGNOSIS — Z7982 Long term (current) use of aspirin: Secondary | ICD-10-CM | POA: Diagnosis not present

## 2020-11-29 DIAGNOSIS — K449 Diaphragmatic hernia without obstruction or gangrene: Secondary | ICD-10-CM | POA: Insufficient documentation

## 2020-11-29 DIAGNOSIS — Z882 Allergy status to sulfonamides status: Secondary | ICD-10-CM | POA: Diagnosis not present

## 2020-11-29 DIAGNOSIS — Z8719 Personal history of other diseases of the digestive system: Secondary | ICD-10-CM | POA: Diagnosis not present

## 2020-11-29 DIAGNOSIS — Z9884 Bariatric surgery status: Secondary | ICD-10-CM | POA: Insufficient documentation

## 2020-11-29 DIAGNOSIS — Z881 Allergy status to other antibiotic agents status: Secondary | ICD-10-CM | POA: Diagnosis not present

## 2020-11-29 DIAGNOSIS — E669 Obesity, unspecified: Secondary | ICD-10-CM | POA: Insufficient documentation

## 2020-11-29 DIAGNOSIS — R1013 Epigastric pain: Secondary | ICD-10-CM

## 2020-11-29 DIAGNOSIS — R1012 Left upper quadrant pain: Secondary | ICD-10-CM | POA: Insufficient documentation

## 2020-11-29 DIAGNOSIS — K589 Irritable bowel syndrome without diarrhea: Secondary | ICD-10-CM | POA: Diagnosis not present

## 2020-11-29 DIAGNOSIS — R1310 Dysphagia, unspecified: Secondary | ICD-10-CM | POA: Insufficient documentation

## 2020-11-29 DIAGNOSIS — Z6841 Body Mass Index (BMI) 40.0 and over, adult: Secondary | ICD-10-CM | POA: Diagnosis not present

## 2020-11-29 DIAGNOSIS — R1319 Other dysphagia: Secondary | ICD-10-CM

## 2020-11-29 DIAGNOSIS — R197 Diarrhea, unspecified: Secondary | ICD-10-CM

## 2020-11-29 HISTORY — PX: ESOPHAGOGASTRODUODENOSCOPY (EGD) WITH PROPOFOL: SHX5813

## 2020-11-29 SURGERY — ESOPHAGOGASTRODUODENOSCOPY (EGD) WITH PROPOFOL
Anesthesia: Monitor Anesthesia Care

## 2020-11-29 MED ORDER — PROPOFOL 500 MG/50ML IV EMUL
INTRAVENOUS | Status: DC | PRN
  Administered 2020-11-29: 110 ug/kg/min via INTRAVENOUS

## 2020-11-29 MED ORDER — PROPOFOL 10 MG/ML IV BOLUS
INTRAVENOUS | Status: DC | PRN
  Administered 2020-11-29 (×5): 10 mg via INTRAVENOUS

## 2020-11-29 MED ORDER — SODIUM CHLORIDE 0.9 % IV SOLN: INTRAVENOUS | Status: DC

## 2020-11-29 MED ORDER — LIDOCAINE HCL 1 % IJ SOLN
INTRAMUSCULAR | Status: DC | PRN
  Administered 2020-11-29: 50 mg via INTRADERMAL

## 2020-11-29 MED ORDER — LACTATED RINGERS IV SOLN
INTRAVENOUS | Status: DC
  Administered 2020-11-29: 1000 mL via INTRAVENOUS

## 2020-11-29 SURGICAL SUPPLY — 15 items

## 2020-11-29 NOTE — Transfer of Care (Signed)
Immediate Anesthesia Transfer of Care Note  Patient: Janet Blanchard  Procedure(s) Performed: ESOPHAGOGASTRODUODENOSCOPY (EGD) WITH PROPOFOL (N/A )  Patient Location: PACU and Endoscopy Unit  Anesthesia Type:MAC  Level of Consciousness: awake, alert , oriented and patient cooperative  Airway & Oxygen Therapy: Patient Spontanous Breathing and Patient connected to face mask oxygen  Post-op Assessment: Report given to RN and Post -op Vital signs reviewed and stable  Post vital signs: Reviewed and stable  Last Vitals:  Vitals Value Taken Time  BP    Temp    Pulse    Resp    SpO2      Last Pain:  Vitals:   11/29/20 0645  TempSrc: Oral  PainSc: 0-No pain         Complications: No complications documented.

## 2020-11-29 NOTE — Op Note (Signed)
[facility] Patient Name: Annalyn Blecher Procedure Date: 11/29/2020 MRN: [id number] Attending MD: Iva Boop , MD Date of Birth: 1976-05-29 CSN: [id number] Age: 45 Admit Type: Outpatient Procedure:                Upper GI endoscopy Indications:              Epigastric abdominal pain, Abdominal pain in the                            left upper quadrant, Dysphagia Providers:                Iva Boop, MD, Dwain Sarna, RN, Rosilyn Mings, Technician Referring MD:              Medicines:                Propofol per Anesthesia, Monitored Anesthesia Care Complications:            No immediate complications. Estimated Blood Loss:     Estimated blood loss: none. Procedure:                Pre-Anesthesia Assessment:                           - Prior to the procedure, a History and Physical                            was performed, and patient medications and                            allergies were reviewed. The patient's tolerance of                            previous anesthesia was also reviewed. The risks                            and benefits of the procedure and the sedation                            options and risks were discussed with the patient.                            All questions were answered, and informed consent                            was obtained. Prior Anticoagulants: The patient has                            taken no previous anticoagulant or antiplatelet                            agents. ASA Grade Assessment: III - A patient with  severe systemic disease. After reviewing the risks                            and benefits, the patient was deemed in                            satisfactory condition to undergo the procedure.                           After obtaining informed consent, the endoscope was                            passed under direct vision. Throughout the                             procedure, the patient's blood pressure, pulse, and                            oxygen saturations were monitored continuously. The                            GIF-H190 ([id number] Olympus gastroscope was                            introduced through the mouth, and advanced to the                            second part of duodenum. The upper GI endoscopy was                            accomplished without difficulty. The patient                            tolerated the procedure well. Scope In: Scope Out: Findings:      A 2 cm hiatal hernia was present.      The exam was otherwise without abnormality.      The cardia and gastric fundus were normal on retroflexion. Impression:               - 2 cm hiatal hernia.                           - Gastroesophageal flap valve classified as Hill                            Grade IV (no fold, wide open lumen, hiatal hernia                            present).                           - The examination was otherwise normal.                           - No specimens collected. Moderate Sedation:  Not Applicable - Patient had care per Anesthesia. Recommendation:           - Patient has a contact number available for                            emergencies. The signs and symptoms of potential                            delayed complications were discussed with the                            patient. Return to normal activities tomorrow.                            Written discharge instructions were provided to the                            patient.                           - Resume previous diet.                           - Continue present medications.                           - Try to change to lower carb, restricted feeding                            diet. Info provided.                           Call for appt w/ me to be seen April/May Procedure Code(s):        --- Professional ---                           [phone],  Esophagogastroduodenoscopy, flexible,                            transoral; diagnostic, including collection of                            specimen(s) by brushing or washing, when performed                            (separate procedure) Diagnosis Code(s):        --- Professional ---                           K44.9, Diaphragmatic hernia without obstruction or                            gangrene                           R10.13, Epigastric pain  R10.12, Left upper quadrant pain                           R13.10, Dysphagia, unspecified CPT copyright 2019 American Medical Association. All rights reserved. The codes documented in this report are preliminary and upon coder review may  be revised to meet current compliance requirements. Iva Boop, MD 11/29/2020 8:23:59 AM This report has been signed electronically. Number of Addenda: 0

## 2020-11-29 NOTE — H&P (Signed)
[facility] History and Physical   Primary Care Physician:  Assunta Found, MD   Reason for Procedure:  LUQ/epigastric pain and dysphagia  Plan:    EGD, esophageal dilation     HPI: Janet Blanchard is a 45 y.o. female seen in Dec - having upper abd pain. That is better but having dysphagia also.   Past Medical History:  Diagnosis Date  . Allergic rhinitis   . Anxiety   . Anxiety and depression   . Back pain   . Cervical dysplasia   . DDD (degenerative disc disease)    L3-S1 with chronic back pain  . Depression   . Dyspnea    with exertion due to weight  . Frequency   . GERD (gastroesophageal reflux disease)   . Herniated disc    L 3-4, L 4-5, S1  . Hiatal hernia    repaired when lap band done  . HNP (herniated nucleus pulposus), lumbar    L3- S1  . IBS (irritable bowel syndrome)   . Migraine headache    more than 3 a month  . Nocturia   . Obesity    BMI >54 ,s/p lap band 2013  . Osteopenia   . Osteopenia   . PTSD (post-traumatic stress disorder)   . Sinusitis    sinus infection 4'17- no problems now-tx. antibiotic  . Vitamin D deficiency    improved    Past Surgical History:  Procedure Laterality Date  . ANKLE RECONSTRUCTION Right 09/20/2014   Procedure: LATERAL RECONSTRUCTION ANKLE RIGHT;  Surgeon: Sheral Apley, MD;  Location: [facility] OR;  Service: Orthopedics;  Laterality: Right;  . CERVICAL BIOPSY  W/ LOOP ELECTRODE EXCISION    . CHOLECYSTECTOMY  1/04   laparoscopic  . COLONOSCOPY  09/07/2008   small internal hemorrhoids, otherwise normal (biopsies) into terminal ileum  . COLONOSCOPY WITH PROPOFOL N/A 12/06/2017   Procedure: COLONOSCOPY WITH PROPOFOL;  Surgeon: Iva Boop, MD;  Location: [facility] ENDOSCOPY;  Service: Endoscopy;  Laterality: N/A;  . COLPOSCOPY    . COMPLEX WOUND CLOSURE Right 09/20/2014   Procedure: 7 CM COMPLEX WOUND CLOSURE;  Surgeon: Sheral Apley, MD;  Location: [facility] OR;  Service: Orthopedics;  Laterality: Right;  .  ESOPHAGOGASTRODUODENOSCOPY  7/07   hiatal hernia  . ESOPHAGOGASTRODUODENOSCOPY N/A 08/18/2013   Procedure: ESOPHAGOGASTRODUODENOSCOPY (EGD);  Surgeon: Iva Boop, MD;  Location: [facility];  Service: Endoscopy;  Laterality: N/A;  office called to get patient put on schedule becase as of 08/15/2013 PT wasn't booked   . ESOPHAGOGASTRODUODENOSCOPY (EGD) WITH PROPOFOL N/A 04/07/2016   Procedure: ESOPHAGOGASTRODUODENOSCOPY (EGD) WITH PROPOFOL;  Surgeon: Ovidio Kin, MD;  Location: [facility]COPY;  Service: General;  Laterality: N/A;  . Hiatial hernia  2013  . I & D EXTREMITY Right 09/20/2014   Procedure: IRRIGATION AND DEBRIDEMENT OF OPEN WOUND  RIGHT FOOT AND TRAUMATIC ARTHROTOMY;  Surgeon: Sheral Apley, MD;  Location: [facility] OR;  Service: Orthopedics;  Laterality: Right;  . lap band removed  2017  . LAPAROSCOPIC GASTRIC BANDING  04/09/2012   Procedure: LAPAROSCOPIC GASTRIC BANDING;  Surgeon: [name], MD;  Location: [facility] ORS;  Service: General;  Laterality: N/A;  . LAPAROSCOPIC TUBAL LIGATION Bilateral 05/02/2019   Procedure: LAPAROSCOPIC TUBAL LIGATION;  Surgeon: Marcelle Overlie, MD;  Location: [facility] OR;  Service: Gynecology;  Laterality: Bilateral;  Please have long Trocars available.  Please have longer Weighted Speculums Available and longer Vaginal Speculum  . LEEP  2000  . NASAL TURBINATE REDUCTION  2005  . SEPTOPLASTY  2005  . SIGMOIDOSCOPY  7/07   ? colitis/proctitis - biopsies normal  . TONSILLECTOMY  2005  . WISDOM TOOTH EXTRACTION  2003    Prior to Admission medications   Medication Sig Start Date End Date Taking? Authorizing Provider  ALPRAZolam Prudy Feeler(XANAX) 1 MG tablet Take 0.5-1 mg by mouth at bedtime.   Yes [provider]  amphetamine-dextroamphetamine (ADDERALL) 20 MG tablet Take 20 mg by mouth See admin instructions. Take Monday - Friday   Yes [provider]  aspirin-acetaminophen-caffeine (EXCEDRIN MIGRAINE) [phone] MG tablet Take 1-2 tablets by  mouth daily as needed for headache.    Yes [provider]  baclofen (LIORESAL) 10 MG tablet Take 10 mg by mouth 2 (two) times daily as needed (migraines).  01/07/13  Yes [provider]  BIOTIN PO Take 1 tablet by mouth daily.    Yes [provider]  bismuth subsalicylate (PEPTO BISMOL) 262 MG chewable tablet Chew 524 mg by mouth as needed for indigestion.    Yes [provider]  BLACK COHOSH EXTRACT PO Take 540 mg by mouth 2 (two) times daily.   Yes [provider]  cetirizine (ZYRTEC) 10 MG tablet Take 10 mg by mouth daily.    Yes [provider]  Cholecalciferol (VITAMIN D3 SUPER STRENGTH) 50 MCG (2000 UT) TABS Take 2,000 Units by mouth daily.   Yes [provider]  diclofenac (VOLTAREN) 50 MG EC tablet Take 50 mg by mouth every morning. 10/05/18  Yes [provider]  dicyclomine (BENTYL) 20 MG tablet Take 1 tablet (20 mg total) by mouth 2 (two) times daily as needed. Patient taking differently: Take 20 mg by mouth 2 (two) times daily as needed for spasms. 09/16/20  Yes Meredith PelGuenther, Paula M, NP  Diphenhydramine-Pseudoephed (BENADRYL ALLERGY/SINUS PO) Take 1-2 tablets by mouth 3 (three) times daily as needed (sinus headches.).   Yes [provider]  DULoxetine (CYMBALTA) 30 MG capsule Take 30 mg by mouth daily. 09/13/20  Yes [provider]  famotidine (PEPCID) 20 MG tablet Take 1 tablet (20 mg total) by mouth at bedtime. 07/23/18  Yes Iva BoopGessner, Ladavia Lindenbaum E, MD  FLUoxetine (PROZAC) 20 MG capsule Take 60 mg by mouth at bedtime.   Yes [provider]  fluticasone (FLONASE) 50 MCG/ACT nasal spray Place 2 sprays into both nostrils daily.    Yes [provider]  Loperamide HCl (IMODIUM A-D PO) Take 1-4 tablets by mouth 2 (two) times daily as needed (diarrhea/loose stools).    Yes [provider]  meclizine (ANTIVERT) 25 MG tablet Take 25 mg by mouth 3 (three) times daily as needed for dizziness.     Yes [provider]  montelukast (SINGULAIR) 10 MG tablet Take 10 mg by mouth daily.    Yes [provider]  omeprazole (PRILOSEC) 40 MG capsule TAKE 1 CAPSULE BY MOUTH EVERY DAY Patient taking differently: Take 40 mg by mouth daily. 09/16/20  Yes Iva BoopGessner, Shannan Garfinkel E, MD  ondansetron (ZOFRAN-ODT) 8 MG disintegrating tablet Take 8 mg by mouth daily as needed for nausea or refractory nausea / vomiting. 07/04/20  Yes [provider]  Probiotic Product (PROBIOTIC PO) Take 1 capsule by mouth daily.   Yes [provider]  progesterone (PROMETRIUM) 200 MG capsule Take 200 mg by mouth at bedtime.  11/28/18  Yes [provider]  simethicone (MYLICON) 125 MG chewable tablet Chew 125 mg by mouth every 6 (six) hours as needed for flatulence.  Yes [provider]  sucralfate (CARAFATE) 1 g tablet Take 1 tablet (1 g total) by mouth as needed. Patient taking differently: Take 1 g by mouth daily as needed (acid reflux). 09/16/20  Yes Meredith Pel, NP  Turmeric 500 MG CAPS Take 500 mg by mouth daily.   Yes [provider]  vitamin B-12 (CYANOCOBALAMIN) 500 MCG tablet Take 500 mcg by mouth daily.   Yes [provider]  vitamin C (ASCORBIC ACID) 500 MG tablet Take 500 mg by mouth daily.   Yes [provider]  zonisamide (ZONEGRAN) 100 MG capsule Take 300 mg by mouth at bedtime. Take with 50 mg for a total of 350 mg   Yes [provider]  zonisamide (ZONEGRAN) 50 MG capsule Take 50 mg by mouth at bedtime. Take with 300 mg for a total of 350 mg   Yes [provider]    Current Facility-Administered Medications  Medication Dose Route Frequency Provider Last Rate Last Admin  . 0.9 %  sodium chloride infusion   Intravenous Continuous Meredith Pel, NP      . lactated ringers infusion   Intravenous Continuous Iva Boop, MD 10 mL/hr at 11/29/20 0654 Continued from Pre-op at 11/29/20 0654    Allergies as of  09/16/2020 - Review Complete 09/16/2020  Allergen Reaction Noted  . Levofloxacin Rash 06/01/2008  . Sulfasalazine Rash 06/01/2008    Family History  Problem Relation Age of Onset  . Diabetes Mother   . Hypertension Mother   . Hyperlipidemia Mother   . Hypertension Father   . Kidney disease Maternal Grandfather   . Cirrhosis Maternal Grandfather        alcoholic  . Hypertension Maternal Grandfather   . Heart disease Other        great uncle  . Uterine cancer Maternal Grandmother   . Hypertension Maternal Grandmother   . Hyperlipidemia Maternal Grandmother   . Hypertension Paternal Grandmother   . Hypertension Paternal Grandfather   . Colon cancer Neg Hx   . Esophageal cancer Neg Hx   . Pancreatic cancer Neg Hx     Social History   Socioeconomic History  . Marital status: Single    Spouse name: Not on file  . Number of children: 0  . Years of education: Not on file  . Highest education level: Not on file  Occupational History  . Occupation: Teacher, adult education: [employer]  Tobacco Use  . Smoking status: Never Smoker  . Smokeless tobacco: Never Used  Vaping Use  . Vaping Use: Never used  Substance and Sexual Activity  . Alcohol use: Yes    Alcohol/week: 10.0 standard drinks    Types: 4 Glasses of wine, 2 Cans of beer, 4 Shots of liquor per week  . Drug use: No  . Sexual activity: Yes    Birth control/protection: None, I.U.D.    Comment: Mirena inserted 05-10-12, intercourse age 23, sexual partners more than 5  Other Topics Concern  . Not on file  Social History Narrative   Single, no children   Charity fundraiser - works for [employer]   Never tobacco, no drugs, 3 drinks EtOH/week   Social Determinants of Corporate investment banker Strain: Not on file  Food Insecurity: Not on file  Transportation Needs: Not on file  Physical Activity: Not on file  Stress: Not on file  Social Connections: Not on file  Intimate Partner Violence: Not on file    Review of Systems:  Positive for  arthralgia All other review of systems negative except as mentioned in the HPI.  Physical Exam: Vital signs in last 24 hours: Temp:  [98.7 F (37.1 C)] 98.7 F (37.1 C) (02/21 0645) Pulse Rate:  [94] 94 (02/21 0645) Resp:  [28] 28 (02/21 0645) BP: (135)/(56) 135/56 (02/21 0645) SpO2:  [98 %] 98 % (02/21 0645) Weight:  [142.9 kg] 142.9 kg (02/21 0645)   General:   Alert,  Well-developed,  pleasant and cooperative in NAD Morbidly obese Lungs:  Clear throughout to auscultation.   Heart:  Regular rate and rhythm; no murmurs, clicks, rubs,  or gallops. Abdomen:  Obese Soft, nontender and nondistended. Normal bowel sounds.   Neuro/Psych:  Alert and cooperative. Normal mood and affect. A and O x 3   @Shenika Quint  , MD, [facility] [phone] (pager) 11/29/2020 7:44 AM@

## 2020-11-29 NOTE — Discharge Instructions (Signed)
Janet Blanchard,  There was a small hiatal hernia - otherwise ok.  I did not see a cause of swallowing issues.  I am recommending the following:  Continue current medications. Please call and make a follow-up to see me in April (or May) - you should call soon.  I think losing weight will help gastrointestinal symptoms and overall health. As you know, this is complicated and can be difficult.  I have studied and become a fan of losing weight by reducing amount of time eating during the day and using lower carb diets and fasting also. While it sounds difficult - results are achievable - if you take your time and adapt your eating habits. The world is against you and [place] with what we have been told to eat. You can make changes that make a difference. I suggest you try the follwing to introduce yourself to the concepts and make changes.  Read The Obesity Code by Dr. Wylene Simmer or Fast, Feat, Repeat by Guadlupe Spanish and implement  suggestions. Investigate and sign up for the www.dietdoctor.com website if desired and utilize those resources. Checkout Dr. Harley Hallmark on YouTube You can also look up Dr. Shanda Howells on the Internet and YouTube I have provided handouts on insulin resistance, restricted feeding/intermittent fasting, and proper food choices to lower and eliminate insulin resistance and lose weight. Have a long-term approach to this and do not expect rapid results but have a 1 to 2-year timeframe to change her eating and to become fat adapted.   I appreciate the opportunity to care for you. Iva Boop, MD, FACG   YOU HAD AN ENDOSCOPIC PROCEDURE TODAY: Refer to the procedure report and other information in the discharge instructions given to you for any specific questions about what was found during the examination. If this information does not answer your questions, please call Dr. Marvell Fuller office at [phone] to clarify.   YOU SHOULD EXPECT: Some feelings of bloating in the abdomen. Passage  of more gas than usual. Walking can help get rid of the air that was put into your GI tract during the procedure and reduce the bloating. If you had a lower endoscopy (such as a colonoscopy or flexible sigmoidoscopy) you may notice spotting of blood in your stool or on the toilet paper. Some abdominal soreness may be present for a day or two, also.  DIET: Your first meal following the procedure should be a light meal and then it is ok to progress to your normal diet. A half-sandwich or bowl of soup is an example of a good first meal. Heavy or fried foods are harder to digest and may make you feel nauseous or bloated. Drink plenty of fluids but you should avoid alcoholic beverages for 24 hours.   ACTIVITY: Your care partner should take you home directly after the procedure. You should plan to take it easy, moving slowly for the rest of the day. You can resume normal activity the day after the procedure however YOU SHOULD NOT DRIVE, use power tools, machinery or perform tasks that involve climbing or major physical exertion for 24 hours (because of the sedation medicines used during the test).   SYMPTOMS TO REPORT IMMEDIATELY: A gastroenterologist can be reached at any hour. Please call [phone]  for any of the following symptoms:    Following upper endoscopy (EGD, EUS, ERCP, esophageal dilation) Vomiting of blood or coffee ground material  New, significant abdominal pain  New, significant chest pain or pain under the shoulder blades  Painful or persistently difficult swallowing  New shortness of breath  Black, tarry-looking or red, bloody stools

## 2020-11-29 NOTE — Anesthesia Postprocedure Evaluation (Signed)
Anesthesia Post Note  Patient: AYLYN WENZLER  Procedure(s) Performed: ESOPHAGOGASTRODUODENOSCOPY (EGD) WITH PROPOFOL (N/A )     Patient location during evaluation: PACU Anesthesia Type: MAC Level of consciousness: awake and alert Pain management: pain level controlled Vital Signs Assessment: post-procedure vital signs reviewed and stable Respiratory status: spontaneous breathing, nonlabored ventilation, respiratory function stable and patient connected to nasal cannula oxygen Cardiovascular status: stable and blood pressure returned to baseline Postop Assessment: no apparent nausea or vomiting Anesthetic complications: no   No complications documented.  Last Vitals:  Vitals:   11/29/20 0820 11/29/20 0830  BP: (!) 141/78 (!) 150/77  Pulse: 86 88  Resp: (!) 21 19  Temp:    SpO2: 99% 100%    Last Pain:  Vitals:   11/29/20 0830  TempSrc:   PainSc: 0-No pain                 Effie Berkshire

## 2020-12-01 ENCOUNTER — Encounter (HOSPITAL_COMMUNITY): Payer: Self-pay | Admitting: Internal Medicine

## 2021-01-27 ENCOUNTER — Other Ambulatory Visit: Payer: Self-pay | Admitting: Nurse Practitioner

## 2021-03-03 ENCOUNTER — Other Ambulatory Visit: Payer: Self-pay | Admitting: Nurse Practitioner

## 2021-03-03 ENCOUNTER — Other Ambulatory Visit: Payer: Self-pay | Admitting: Internal Medicine

## 2021-06-24 ENCOUNTER — Other Ambulatory Visit: Payer: Self-pay | Admitting: Internal Medicine

## 2022-01-11 ENCOUNTER — Other Ambulatory Visit: Payer: Self-pay | Admitting: Internal Medicine

## 2022-02-23 ENCOUNTER — Telehealth: Payer: No Typology Code available for payment source

## 2022-03-15 ENCOUNTER — Ambulatory Visit (INDEPENDENT_AMBULATORY_CARE_PROVIDER_SITE_OTHER): Payer: No Typology Code available for payment source | Admitting: Neurology

## 2022-03-15 ENCOUNTER — Encounter: Payer: Self-pay | Admitting: Neurology

## 2022-03-15 VITALS — BP 118/94 | HR 111 | Ht 62.0 in | Wt 332.0 lb

## 2022-03-15 DIAGNOSIS — R0683 Snoring: Secondary | ICD-10-CM | POA: Diagnosis not present

## 2022-03-15 DIAGNOSIS — G4719 Other hypersomnia: Secondary | ICD-10-CM | POA: Insufficient documentation

## 2022-03-15 DIAGNOSIS — H532 Diplopia: Secondary | ICD-10-CM | POA: Insufficient documentation

## 2022-03-15 NOTE — Progress Notes (Signed)
[facility]  PATIENT: Janet Blanchard DOB: [birth year]  REFERRING DOCTOR OR PCP: Osborn Cohoavid Shoemaker, MD; Assunta FoundJohn Golding, MD SOURCE: Patient, notes from ENT, imaging reports and images reviewed  _________________________________   HISTORICAL  CHIEF COMPLAINT:  Chief Complaint  Patient presents with   New Patient (Initial Visit)    Rm 1,alone. Pt reffered for sudden onset of visual disturbance. Pt travelled to [place] in May, during her flight pt feel asleep. When landing in [place] pt noticed vision loss and L eye was looking up and over, these sx lasted 2 days. Sx have not returned since then.     HISTORY OF PRESENT ILLNESS:  I had the pleasure of [facility] patient, [facility]anchard[facility] neurolog[facility] consultation regarding her episode of diplopia.  She is a 46 year old woman who reported 2 to 3 days of double vision.  On 02/12/2022, while flying to the [place],she fell asleep.   When she woke up she had diplopia and the left eye was positioned laterally and a little up.   She was unable to adduct the eye.   She had no ptosis.   Each eye could see independently but she had to cover one eye due to the diplopia.  The next day was unchanged but on 02/14/2022, the diplopia began to improve and by later in the day it was resolved.   She had no jerking/nystagmus.     She had no other symptoms.   She did note that day was stressful and she was tired.   She only felt dizzy if both eyes were open.  She went to the hotel clinic when she got there and BP was mildly elevated (140/90).  She opted not to go to the ED.     No gait disturbance, clumsiness, weakness or numbness now or episodes > 24 hours in past.    She has a history of migraine headaches but had no headaches, Nausea or visual scotomata at the time.     Her cousin (April Salomon FickLindsey Evans) is a patient I see with MS.    She is otherwise healthy.   No DM or HTN diagnosis.   She has some anxiety/stress.      She has fatigue but no recent change.   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    She snores and has EDS.  She had a PSG with [facility] around 2016 and was tod she had apnea but not OSA.   An oral appliance was recommended.  She is always sleepy.  Weight has fluctuated but was probably similar when  she had PSG.     She does not have DM, HTN.   She does not smoke.     EPWORTH SLEEPINESS SCALE  On a scale of 0 - 3 what is the chance of dozing:  Sitting and Reading:   0 Watching TV:    3 Sitting inactive in a public place: 3 Passenger in car for one hour: 3 Lying down to rest in the afternoon: 3 Sitting and talking to someone: 0 Sitting quietly after lunch:  3 In a car, stopped in traffic:  0  Total (out of 24):   15/24 moderate EDS    Imaging review:  CT scan of the head 09/20/2014 was normal.  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   No wheezes GastrointestinaI: No nausea, vomiting, diarrhea, abdominal pain, fecal incontinence Genitourinary:  No dysuria, urinary retention or frequency.  No nocturia. Musculoskeletal:  No neck pain, back pain Integumentary: No rash, pruritus, skin lesions Neurological: as above Psychiatric: No depression at this time.  No anxiety Endocrine: No palpitations, diaphoresis, change in appetite, change in weigh or increased thirst Hematologic/Lymphatic:  No anemia, purpura, petechiae. Allergic/Immunologic: No itchy/runny eyes, nasal congestion, recent allergic reactions, rashes  ALLERGIES: Allergies  Allergen Reactions    Oxycodone Other (See Comments)    Does not alleviate pain  Don't fell well   Levofloxacin Rash    Arms only.   Sulfasalazine Rash    Elbows, knees, and three of her fingers only    HOME MEDICATIONS:  Current Outpatient Medications:    ALPRAZolam (XANAX) 1 MG tablet, Take 0.5-1 mg by mouth at bedtime., Disp: , Rfl:    amphetamine-dextroamphetamine (ADDERALL) 20 MG tablet, Take 20 mg by mouth See admin instructions. Take Monday - Friday, Disp: , Rfl:    aspirin-acetaminophen-caffeine (EXCEDRIN MIGRAINE) 250-250-65 MG tablet, Take 1-2 tablets by mouth daily as needed for headache. , Disp: , Rfl:    baclofen (LIORESAL) 10 MG tablet, Take 10 mg by mouth 2 (two) times daily as needed (migraines). , Disp: , Rfl:    BIOTIN PO, Take 1 tablet by mouth daily. , Disp: , Rfl:    bismuth subsalicylate (PEPTO BISMOL) 262 MG chewable tablet, Chew 524 mg by mouth as needed for indigestion. , Disp: , Rfl:    BLACK COHOSH EXTRACT PO, Take 540 mg by mouth daily., Disp: , Rfl:    busPIRone (BUSPAR) 15 MG tablet, Take 15 mg by mouth 2 (two) times daily., Disp: , Rfl:    cetirizine (ZYRTEC) 10 MG tablet, Take 10 mg by mouth daily. , Disp: , Rfl:    Cholecalciferol (VITAMIN D3 SUPER STRENGTH) 50 MCG (2000 UT) TABS, Take 2,000 Units by mouth daily., Disp: , Rfl:    diclofenac (VOLTAREN) 50 MG EC tablet, Take 50 mg by mouth every morning., Disp: , Rfl:    dicyclomine (BENTYL) 20 MG tablet, TAKE 1 TABLET BY MOUTH 2 TIMES DAILY AS NEEDED., Disp: 180 tablet, Rfl: 1   Diphenhydramine-Pseudoephed (BENADRYL ALLERGY/SINUS PO), Take 1-2 tablets by mouth 3 (three) times daily as needed (sinus headches.)., Disp: , Rfl:    famotidine (PEPCID) 20 MG tablet, Take 1 tablet (20 mg total) by mouth at bedtime., Disp: 90 tablet, Rfl: 1   FLUoxetine (PROZAC) 20 MG capsule, Take 60 mg by mouth at bedtime., Disp: , Rfl:    fluticasone (FLONASE) 50 MCG/ACT nasal spray, Place 2 sprays into both nostrils daily. , Disp: , Rfl:     Loperamide HCl (IMODIUM A-D PO), Take 1-4 tablets by mouth 2 (two) times daily as needed (diarrhea/loose stools). , Disp: , Rfl:    meclizine (ANTIVERT) 25 MG tablet, Take 25 mg by mouth 3 (three) times daily as needed for dizziness. , Disp: , Rfl:    montelukast (SINGULAIR) 10 MG tablet, Take 10 mg by mouth daily. , Disp: , Rfl:    omeprazole (PRILOSEC) 40 MG capsule, TAKE 1 CAPSULE BY MOUTH EVERY DAY, Disp: 90 capsule, Rfl: 1   ondansetron (ZOFRAN-ODT) 8 MG  disintegrating tablet, Take 8 mg by mouth daily as needed for nausea or refractory nausea / vomiting., Disp: , Rfl:    progesterone (PROMETRIUM) 200 MG capsule, Take 200 mg by mouth at bedtime. , Disp: , Rfl:    simethicone (MYLICON) 125 MG chewable tablet, Chew 125 mg by mouth every 6 (six) hours as needed for flatulence., Disp: , Rfl:    sucralfate (CARAFATE) 1 g tablet, TAKE 1 TABLET (1 G TOTAL) BY MOUTH AS NEEDED., Disp: 90 tablet, Rfl: 1   vitamin B-12 (CYANOCOBALAMIN) 500 MCG tablet, Take 500 mcg by mouth daily., Disp: , Rfl:    vitamin C (ASCORBIC ACID) 500 MG tablet, Take 500 mg by mouth daily., Disp: , Rfl:    zonisamide (ZONEGRAN) 100 MG capsule, Take 300 mg by mouth at bedtime. Take with 50 mg for a total of 350 mg, Disp: , Rfl:    zonisamide (ZONEGRAN) 50 MG capsule, Take 50 mg by mouth at bedtime. Take with 300 mg for a total of 350 mg, Disp: , Rfl:    estradiol (ESTRACE) 1 MG tablet, Take 1 mg by mouth daily., Disp: , Rfl:   PAST MEDICAL HISTORY: Past Medical History:  Diagnosis Date   Allergic rhinitis    Anxiety    Anxiety and depression    Back pain    Cervical dysplasia    DDD (degenerative disc disease)    L3-S1 with chronic back pain   Depression    Dyspnea    with exertion due to weight   Frequency    GERD (gastroesophageal reflux disease)    Herniated disc    L 3-4, L 4-5, S1   Hiatal hernia    repaired when lap band done   HNP (herniated nucleus pulposus), lumbar    L3- S1   IBS (irritable bowel  syndrome)    Migraine headache    more than 3 a month   Nocturia    Obesity    BMI >54 ,s/p lap band 2013   Osteopenia    Osteopenia    PTSD (post-traumatic stress disorder)    Sinusitis    sinus infection 4'17- no problems now-tx. antibiotic   Vitamin D deficiency    improved    PAST SURGICAL HISTORY: Past Surgical History:  Procedure Laterality Date   ANKLE RECONSTRUCTION Right 09/20/2014   Procedure: LATERAL RECONSTRUCTION ANKLE RIGHT;  Surgeon: Sheral Apley, MD;  Location: [facility] OR;  Service: Orthopedics;  Laterality: Right;   CERVICAL BIOPSY  W/ LOOP ELECTRODE EXCISION     CHOLECYSTECTOMY  1/04   laparoscopic   COLONOSCOPY  09/07/2008   small internal hemorrhoids, otherwise normal (biopsies) into terminal ileum   COLONOSCOPY WITH PROPOFOL N/A 12/06/2017   Procedure: COLONOSCOPY WITH PROPOFOL;  Surgeon: Iva Boop, MD;  Location: [facility];  Service: Endoscopy;  Laterality: N/A;   COLPOSCOPY     COMPLEX WOUND CLOSURE Right 09/20/2014   Procedure: 7 CM COMPLEX WOUND CLOSURE;  Surgeon: Sheral Apley, MD;  Location: [facility] OR;  Service: Orthopedics;  Laterality: Right;   ESOPHAGOGASTRODUODENOSCOPY  7/07   hiatal hernia   ESOPHAGOGASTRODUODENOSCOPY N/A 08/18/2013   Procedure: ESOPHAGOGASTRODUODENOSCOPY (EGD);  Surgeon: Iva Boop, MD;  Location: [facility];  Service: Endoscopy;  Laterality: N/A;  office called to get patient put on schedule becase as of 08/15/2013 PT wasn't booked    ESOPHAGOGASTRODUODENOSCOPY (EGD) WITH PROPOFOL N/A 04/07/2016   Procedure: ESOPHAGOGASTRODUODENOSCOPY (EGD) WITH PROPOFOL;  Surgeon: Ovidio Kin, MD;  Location: [facility];  Service: General;  Laterality: N/A;   ESOPHAGOGASTRODUODENOSCOPY (EGD) WITH PROPOFOL N/A 11/29/2020   Procedure: ESOPHAGOGASTRODUODENOSCOPY (EGD) WITH PROPOFOL;  Surgeon: Iva Boop, MD;  Location: [facility];  Service: Endoscopy;  Laterality: N/A;   Hiatial hernia  2013   I & D EXTREMITY Right 09/20/2014    Procedure: IRRIGATION AND DEBRIDEMENT OF OPEN WOUND  RIGHT FOOT AND TRAUMATIC ARTHROTOMY;  Surgeon: Sheral Apley, MD;  Location: [facility] OR;  Service: Orthopedics;  Laterality: Right;   lap band removed  2017   LAPAROSCOPIC GASTRIC BANDING  04/09/2012   Procedure: LAPAROSCOPIC GASTRIC BANDING;  Surgeon: [name], MD;  Location: [facility];  Service: General;  Laterality: N/A;   LAPAROSCOPIC TUBAL LIGATION Bilateral 05/02/2019   Procedure: LAPAROSCOPIC TUBAL LIGATION;  Surgeon: Marcelle Overlie, MD;  [facility];  Service: Gynecology;  Laterality: Bilateral;  Please have long Trocars available.  Please have longer Weighted Speculums Available and longer Vaginal Speculum   LEEP  2000   NASAL TURBINATE REDUCTION  2005   SEPTOPLASTY  2005   SIGMOIDOSCOPY  7/07   ? colitis/proctitis - biopsies normal   TONSILLECTOMY  2005   WISDOM TOOTH EXTRACTION  2003    FAMILY HISTORY: Family History  Problem Relation Age of Onset   Diabetes Mother    Hypertension Mother    Hyperlipidemia Mother    Hypertension Father    Kidney disease Maternal Grandfather    Cirrhosis Maternal Grandfather        alcoholic   Hypertension Maternal Grandfather    Heart disease Other        great uncle   Uterine cancer Maternal Grandmother    Hypertension Maternal Grandmother    Hyperlipidemia Maternal Grandmother    Hypertension Paternal Grandmother    Hypertension Paternal Grandfather    Colon cancer Neg Hx    Esophageal cancer Neg Hx    Pancreatic cancer Neg Hx     SOCIAL HISTORY:  Social History   Socioeconomic History   Marital status: Single    Spouse name: Not on file   Number of children: 0   Years of education: Not on file   Highest education level: Not on file  Occupational History   Occupation: RN    Employer: [employer]  Tobacco Use   Smoking status: Never   Smokeless tobacco: Never  Vaping Use   Vaping Use: Never used  Substance and Sexual Activity   Alcohol use: Yes     Alcohol/week: 10.0 standard drinks    Types: 4 Glasses of wine, 2 Cans of beer, 4 Shots of liquor per week   Drug use: No   Sexual activity: Yes    Birth control/protection: None, I.U.D.    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 Occa sweet tea.   Social Determinants of Health   Financial Resource Strain: Not on file  Food Insecurity: Not on file  Transportation Needs: Not on file  Physical Activity: Not on file  Stress: Not on file  Social Connections: Not on file  Intimate Partner Violence: Not on file     PHYSICAL EXAM  Vitals:   03/15/22 1253  BP: (!) 118/94  Pulse: (!) 111  Weight: (!) 332 lb (150.6 kg)  Height:  (1.575  m)    Vision Screening   Right eye Left eye Both eyes  Without correction 20/20 20/30 20/20   With correction       Body mass index is 60.72 kg/m.   General: The patient is well-developed and well-nourished and in no acute distress  HEENT:  Head is [place]/AT.  Sclera are anicteric.  Funduscopic exam shows normal optic discs and retinal vessels.  Neck: No carotid bruits are noted.  The neck is nontender.  Cardiovascular: The heart has a regular rate and rhythm with a normal S1 and S2. There were no murmurs, gallops or rubs.    Skin: Extremities are without rash or  edema.  Musculoskeletal:  Back is nontender  Neurologic Exam  Mental status: The patient is alert and oriented x 3 at the time of the examination. The patient has apparent normal recent and remote memory, with an apparently normal attention span and concentration ability.   Speech is normal.  Cranial nerves: Extraocular movements are full. Pupils are equal, round, and reactive to light and accomodation.  Visual fields are full.  Facial symmetry is present. There is good facial sensation to  soft touch bilaterally.Facial strength is normal.  Trapezius and sternocleidomastoid strength is normal. No dysarthria is noted.  The tongue is midline, and the patient has symmetric elevation of the soft palate. No obvious hearing deficits are noted.  Motor:  Muscle bulk is normal.   Tone is normal. Strength is  5 / 5 in all 4 extremities.   Sensory: Sensory testing is intact to pinprick, soft touch and vibration sensation in all 4 extremities.  Coordination: Cerebellar testing reveals good finger-nose-finger and heel-to-shin bilaterally.  Gait and station: Station is normal.   Gait is normal. Tandem gait is normal. Romberg is negative.   Reflexes: Deep tendon reflexes are symmetric and normal n arms but she has spread at knees.   Plantar responses are flexor.    DIAGNOSTIC DATA (LABS, IMAGING, TESTING) - I reviewed patient records, labs, notes, testing and imaging myself where available.  Lab Results  Component Value Date   WBC 6.5 04/25/2019   HGB 13.1 04/25/2019   HCT 42.3 04/25/2019   MCV 91.0 04/25/2019   PLT 389 04/25/2019      Component Value Date/Time   NA 141 06/01/2016 1038   K 4.1 06/01/2016 1038   CL 105 06/01/2016 1038   CO2 24 06/01/2016 1038   GLUCOSE 90 06/01/2016 1038   BUN 11 06/01/2016 1038   CREATININE 0.94 06/01/2016 1038   CALCIUM 9.2 06/01/2016 1038   PROT 6.9 06/01/2016 1038   ALBUMIN 4.1 06/01/2016 1038   AST 15 06/01/2016 1038   ALT 14 06/01/2016 1038   ALKPHOS 80 06/01/2016 1038   BILITOT 0.4 06/01/2016 1038   GFRNONAA >60 03/31/2016 0731   GFRAA >60 03/31/2016 0731   Lab Results  Component Value Date   CHOL 184 06/01/2016   HDL 65 06/01/2016   LDLCALC 100 06/01/2016   TRIG 97 06/01/2016   CHOLHDL 2.8 06/01/2016   No results found for: HGBA1C No results found for: VITAMINB12 Lab Results  Component Value Date   TSH 1.24 06/01/2016       ASSESSMENT AND PLAN  Diplopia - Plan: Sedimentation rate, C-reactive protein, ANA w/Reflex,  MR BRAIN W WO CONTRAST, CANCELED: MR BRAIN W WO CONTRAST  Snoring - Plan: Home sleep test  Excessive daytime sleepiness - Plan: Home sleep test  Morbid obesity (HCC)   In summary, Ms. Nile is  a 46 year old woman with 2 to 3 days of diplopia early May 2023.  The onset was not associated with any trauma.  The left eye had more difficulty focusing on the right eye and had a restricted range of motion, unable to look in.  The eye was abducted and elevated.  Symptoms improved and completely recovered.  She currently has no visual issues.  The diplopia had not been associated with other neurologic symptoms.  In the past, she has not had other symptoms with neurologic impairment lasting over 24 hours.  She has a cousin with multiple sclerosis.  I am concerned about the possibility of MS and we need to check an MRI of the brain with and without contrast to further evaluate for this possibility and to rule out stroke and other etiology.  The position of the eye abducted and elevated) was not consistent with a third 4th or 6th cranial nerve palsy though as a full examination could not be performed at that time this remains a possibility..  I will check vasculitic labs to further evaluate.  Addition, she has snoring and excessive daytime sleepiness.  She had been told she had apnea (by description central apnea) on a sleep study 6 years ago.  As she has moderate excessive daytime sleepiness, we will check a home sleep study and further evaluate based on the results.  Thank you for asking me to see Ms. Lindell.  Please let me know if I can be of further assistance with her or other patients in the future.  She will return to see me based on the results of the studies and should call with new or worsening neurologic symptoms.  Thank you for asking me to see Ms. Metzner.  Please let me know if I can be of further assistance with her or other patients in the future.  Jemeka Wagler A. Epimenio Foot, MD, [facility] 03/15/2022, 1:48  PM Certified in Neurology, Clinical Neurophysiology, Sleep Medicine and Neuroimaging  [facility] [address], [address] [place], [place] [address] [phone]

## 2022-03-16 LAB — SEDIMENTATION RATE: Sed Rate: 16 mm/hr (ref 0–32)

## 2022-03-16 LAB — C-REACTIVE PROTEIN: CRP: 16 mg/L — ABNORMAL HIGH (ref 0–10)

## 2022-03-16 LAB — ANA W/REFLEX: Anti Nuclear Antibody (ANA): NEGATIVE

## 2022-03-17 ENCOUNTER — Telehealth: Payer: Self-pay | Admitting: Neurology

## 2022-03-17 NOTE — Telephone Encounter (Signed)
[employer] sent to GI they obtain auth and call patient to schedule 

## 2022-03-30 ENCOUNTER — Telehealth: Payer: Self-pay | Admitting: Neurology

## 2022-03-30 NOTE — Telephone Encounter (Signed)
HST- [employer]- no Berkley Harvey req spoke to April ref # [id number]. Patient is scheduled at [facility] for 2:30 pm.

## 2022-04-01 ENCOUNTER — Other Ambulatory Visit: Payer: No Typology Code available for payment source

## 2022-04-02 ENCOUNTER — Ambulatory Visit
Admission: RE | Admit: 2022-04-02 | Discharge: 2022-04-02 | Disposition: A | Discharge status: Home / Self Care | Payer: No Typology Code available for payment source | Source: Ambulatory Visit | Attending: Neurology | Admitting: Neurology

## 2022-04-02 DIAGNOSIS — H532 Diplopia: Secondary | ICD-10-CM

## 2022-04-02 MED ORDER — GADOBENATE DIMEGLUMINE 529 MG/ML IV SOLN
20.0000 mL | Freq: Once | INTRAVENOUS | Status: AC | PRN
  Administered 2022-04-02: 20 mL via INTRAVENOUS

## 2022-04-27 ENCOUNTER — Telehealth: Payer: Self-pay

## 2022-04-27 DIAGNOSIS — Z0289 Encounter for other administrative examinations: Secondary | ICD-10-CM

## 2022-04-27 NOTE — Telephone Encounter (Signed)
On 04/27/22 per Gaynell in billing pt paid the $150.00 no show fee for the hst that is missed on 04/18/22. The pt is rescheduled for a mail out hst on 05/03/22.

## 2022-05-02 ENCOUNTER — Ambulatory Visit: Payer: No Typology Code available for payment source | Admitting: Neurology

## 2022-05-02 DIAGNOSIS — G4710 Hypersomnia, unspecified: Secondary | ICD-10-CM | POA: Diagnosis not present

## 2022-05-02 DIAGNOSIS — G4733 Obstructive sleep apnea (adult) (pediatric): Secondary | ICD-10-CM | POA: Insufficient documentation

## 2022-05-02 DIAGNOSIS — G4719 Other hypersomnia: Secondary | ICD-10-CM

## 2022-05-02 DIAGNOSIS — R0683 Snoring: Secondary | ICD-10-CM

## 2022-05-02 NOTE — Progress Notes (Signed)
   [facility]  HOME SLEEP STUDY  STUDY DATE: 05/01/2022 PATIENT NAME: Janet Blanchard DOB: Mar 01, 1976 MRN: [id number]  ORDERING CLINICIAN: Richard A. Epimenio Foot, MD, PhD REFERRING CLINICIAN: Richard A. Epimenio Foot, MD. PhD   CLINICAL INFORMATION: 46 year old woman with snoring and excessive daytime sleepiness   IMPRESSION:  1.  Moderate obstructive sleep apnea (pAHI 3% of 22.8) 2.  Moderate excessive daytime sleepiness (Epworth sleepiness scale 15/24)   RECOMMENDATION: 1.  AutoPap 5-20 cm H2O with heated humidifier.  Download in 30 to 90 days. 2.  Follow-up with Dr. Epimenio Foot   INTERPRETING PHYSICIAN:   Pearletha Furl. Epimenio Foot, MD, PhD, [facility] Certified in Neurology, Clinical Neurophysiology, Sleep Medicine, Pain Medicine and Neuroimaging  [facility] [address] [place], [place] [address] [phone]

## 2022-05-03 ENCOUNTER — Telehealth: Payer: Self-pay | Admitting: *Deleted

## 2022-05-03 NOTE — Telephone Encounter (Signed)
-----   Message from Asa Lente, MD sent at 05/02/2022  5:03 PM EDT ----- Regarding: Home sleep study Please let her know that the home sleep study showed moderate sleep apnea and I would like to start AutoPap 5-20 cm.

## 2022-05-03 NOTE — Telephone Encounter (Signed)
I called pt. She answered and said "Hello". I introduced myself and explained I was calling to go over her sleep study results. After no response for a second, I asked if she was still there and she replied with, "I'm sorry, its a little early". I offered for her to call back at her convenience and she states, "no it's fine I can talk". Relayed results per Dr. Bonnita Hollow note. Pt replied:"He knew I wasn't going to do CPAP". I explained how CPAP's have improved over the years and encouraged her to give it a try at least. Also explained risks of untreated CPAP. 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  She then asked when she can follow up with Dr. Epimenio Foot. Explained that since her MRI was ok and Dr. Epimenio Foot would not be treating anything/prescribing meds, unsure if she needs to f/u with him or continue f/u with PCP moving forward. Will clarify with MD and then call her back. She verbalized understanding.

## 2022-10-06 ENCOUNTER — Other Ambulatory Visit: Payer: Self-pay | Admitting: Internal Medicine

## 2022-10-31 ENCOUNTER — Other Ambulatory Visit: Payer: Self-pay | Admitting: Family Medicine

## 2022-10-31 DIAGNOSIS — R600 Localized edema: Secondary | ICD-10-CM

## 2022-10-31 DIAGNOSIS — R7989 Other specified abnormal findings of blood chemistry: Secondary | ICD-10-CM

## 2022-11-07 ENCOUNTER — Ambulatory Visit
Admission: RE | Admit: 2022-11-07 | Discharge: 2022-11-07 | Disposition: A | Discharge status: Home / Self Care | Payer: No Typology Code available for payment source | Source: Ambulatory Visit | Attending: Family Medicine | Admitting: Family Medicine

## 2022-11-07 DIAGNOSIS — R7989 Other specified abnormal findings of blood chemistry: Secondary | ICD-10-CM

## 2022-11-07 DIAGNOSIS — R600 Localized edema: Secondary | ICD-10-CM

## 2024-03-18 ENCOUNTER — Other Ambulatory Visit: Payer: Self-pay | Admitting: Physician Assistant

## 2024-03-18 DIAGNOSIS — M5416 Radiculopathy, lumbar region: Secondary | ICD-10-CM

## 2024-04-04 ENCOUNTER — Encounter: Payer: Self-pay | Admitting: Physician Assistant

## 2024-04-19 ENCOUNTER — Inpatient Hospital Stay: Admission: RE | Admit: 2024-04-19 | Source: Ambulatory Visit

## 2024-04-25 ENCOUNTER — Emergency Department (HOSPITAL_BASED_OUTPATIENT_CLINIC_OR_DEPARTMENT_OTHER)

## 2024-04-25 ENCOUNTER — Emergency Department (HOSPITAL_BASED_OUTPATIENT_CLINIC_OR_DEPARTMENT_OTHER)
Admission: EM | Admit: 2024-04-25 | Discharge: 2024-04-25 | Disposition: A | Discharge status: Home / Self Care | Attending: Emergency Medicine | Admitting: Emergency Medicine

## 2024-04-25 ENCOUNTER — Encounter (HOSPITAL_BASED_OUTPATIENT_CLINIC_OR_DEPARTMENT_OTHER): Payer: Self-pay | Admitting: Emergency Medicine

## 2024-04-25 ENCOUNTER — Other Ambulatory Visit: Payer: Self-pay

## 2024-04-25 DIAGNOSIS — R519 Headache, unspecified: Secondary | ICD-10-CM | POA: Diagnosis present

## 2024-04-25 DIAGNOSIS — G43909 Migraine, unspecified, not intractable, without status migrainosus: Secondary | ICD-10-CM | POA: Insufficient documentation

## 2024-04-25 LAB — CBC
HCT: 43.8 % (ref 36.0–46.0)
Hemoglobin: 14 g/dL (ref 12.0–15.0)
MCH: 28.1 pg (ref 26.0–34.0)
MCHC: 32 g/dL (ref 30.0–36.0)
MCV: 87.8 fL (ref 80.0–100.0)
Platelets: 341 K/uL (ref 150–400)
RBC: 4.99 MIL/uL (ref 3.87–5.11)
RDW: 14.4 % (ref 11.5–15.5)
WBC: 7.4 K/uL (ref 4.0–10.5)
nRBC: 0 % (ref 0.0–0.2)

## 2024-04-25 LAB — BASIC METABOLIC PANEL WITH GFR
Anion gap: 14 (ref 5–15)
BUN: 10 mg/dL (ref 6–20)
CO2: 21 mmol/L — ABNORMAL LOW (ref 22–32)
Calcium: 9.4 mg/dL (ref 8.9–10.3)
Chloride: 106 mmol/L (ref 98–111)
Creatinine, Ser: 1.09 mg/dL — ABNORMAL HIGH (ref 0.44–1.00)
GFR, Estimated: >60 mL/min (ref >60)
Glucose, Bld: 80 mg/dL (ref 70–99)
Potassium: 4.4 mmol/L (ref 3.5–5.1)
Sodium: 142 mmol/L (ref 135–145)

## 2024-04-25 LAB — HCG, SERUM, QUALITATIVE: Preg, Serum: NEGATIVE

## 2024-04-25 MED ORDER — DIPHENHYDRAMINE HCL 50 MG/ML IJ SOLN
12.5000 mg | Freq: Once | INTRAMUSCULAR | Status: AC
  Administered 2024-04-25: 12.5 mg via INTRAVENOUS
  Filled 2024-04-25: qty 1

## 2024-04-25 MED ORDER — ACETAMINOPHEN 500 MG PO TABS
1000.0000 mg | ORAL_TABLET | Freq: Once | ORAL | Status: AC
Start: 2024-04-25 — End: 2024-04-25
  Administered 2024-04-25: 1000 mg via ORAL
  Filled 2024-04-25: qty 2

## 2024-04-25 MED ORDER — SODIUM CHLORIDE 0.9 % IV BOLUS
1000.0000 mL | Freq: Once | INTRAVENOUS | Status: AC
  Administered 2024-04-25: 1000 mL via INTRAVENOUS

## 2024-04-25 MED ORDER — PROCHLORPERAZINE EDISYLATE 10 MG/2ML IJ SOLN
10.0000 mg | Freq: Once | INTRAMUSCULAR | Status: AC
  Administered 2024-04-25: 10 mg via INTRAVENOUS
  Filled 2024-04-25: qty 2

## 2024-04-25 NOTE — Discharge Instructions (Addendum)
 I would recommend following up with neurology next week as discussed.  CBC, BMP and Head CT are normal today. Continue to monitor symptoms at home.  Return to ER with new or worsening symptoms.

## 2024-04-25 NOTE — ED Provider Notes (Signed)
 [place] EMERGENCY DEPARTMENT AT [facility] Provider Note   CSN: [id number] Arrival date & time: 04/25/24  1219     Patient presents with: Headache   Janet Blanchard is a 48 y.o. female.  With past medical history of anxiety and depression, cervical dysplasia, migraines presents to ER with complaint of headache. Patient describes HA is right sided wrapping to back of head.  Currently a 4 out of 10.  Did seem to start quite suddenly but has slowly decreased since then.  Associated with nausea, no vomiting.  Also some sensitivity to bright lights.  Headache does not get worse when bearing down.  No associated neck stiffness.  No viral-like symptoms or congestion. Feels different then typical HA. No worsening symptoms with baring down or activity.     Headache      Prior to Admission medications   Medication Sig Start Date End Date Taking? Authorizing Provider  Galcanezumab-gnlm (EMGALITY [place]) Inject into the skin every 30 (thirty) days.   Yes [provider]  ALPRAZolam  (XANAX ) 1 MG tablet Take 0.5-1 mg by mouth at bedtime.    [provider]  amphetamine-dextroamphetamine (ADDERALL) 20 MG tablet Take 20 mg by mouth See admin instructions. Take Monday - Friday    [provider]  aspirin-acetaminophen -caffeine (EXCEDRIN MIGRAINE) 250-250-65 MG tablet Take 1-2 tablets by mouth daily as needed for headache.     [provider]  baclofen  (LIORESAL ) 10 MG tablet Take 10 mg by mouth 2 (two) times daily as needed (migraines).  01/07/13   [provider]  BIOTIN PO Take 1 tablet by mouth daily.     [provider]  bismuth subsalicylate (PEPTO BISMOL) 262 MG chewable tablet Chew 524 mg by mouth as needed for indigestion.     [provider]  BLACK COHOSH EXTRACT PO Take 540 mg by mouth daily.    [provider]  busPIRone  (BUSPAR ) 15 MG tablet Take 15 mg by mouth 2 (two) times daily. 03/07/22   [provider]  cetirizine (ZYRTEC) 10 MG tablet Take 10 mg by mouth daily.     [provider]  Cholecalciferol (VITAMIN D3 SUPER STRENGTH) 50 MCG (2000 UT) TABS Take 2,000 Units by mouth daily.    [provider]  diclofenac (VOLTAREN) 50 MG EC tablet Take 50 mg by mouth every morning. 10/05/18   [provider]  dicyclomine  (BENTYL ) 20 MG tablet TAKE 1 TABLET BY MOUTH 2 TIMES DAILY AS NEEDED. 03/04/21   Kerman Vina HERO, NP  Diphenhydramine -Pseudoephed (BENADRYL  ALLERGY/SINUS PO) Take 1-2 tablets by mouth 3 (three) times daily as needed (sinus headches.).    [provider]  estradiol (ESTRACE) 1 MG tablet Take 1 mg by mouth daily. 01/22/22   [provider]  famotidine  (PEPCID ) 20 MG tablet Take 1 tablet (20 mg total) by mouth at bedtime. 07/23/18   Avram Lupita BRAVO, MD  FLUoxetine  (PROZAC ) 20 MG capsule Take 60 mg by mouth at bedtime.    [provider]  fluticasone (FLONASE) 50 MCG/ACT nasal spray Place 2 sprays into both nostrils daily.     [provider]  Loperamide HCl (IMODIUM A-D PO) Take 1-4 tablets by mouth 2 (two) times daily as needed (diarrhea/loose stools).     [provider]  meclizine  (ANTIVERT ) 25 MG tablet Take 25 mg by mouth 3 (three) times daily as needed for dizziness.     [provider]  montelukast  (SINGULAIR ) 10 MG tablet Take 10 mg  by mouth daily.     [provider]  omeprazole  (PRILOSEC) 40 MG capsule TAKE 1 CAPSULE BY MOUTH EVERY DAY 01/11/22   Avram Lupita BRAVO, MD  ondansetron  (ZOFRAN -ODT) 8 MG disintegrating tablet Take 8 mg by mouth daily as needed for nausea or refractory nausea / vomiting. 07/04/20   [provider]  progesterone (PROMETRIUM) 200 MG capsule Take 200 mg by mouth at bedtime.  11/28/18   [provider]  simethicone (MYLICON) 125 MG chewable tablet Chew 125 mg by mouth every 6 (six) hours as needed for flatulence.    [provider]  sucralfate   (CARAFATE ) 1 g tablet TAKE 1 TABLET (1 G TOTAL) BY MOUTH AS NEEDED. 01/27/21   Kerman Vina HERO, NP  vitamin B-12 (CYANOCOBALAMIN) 500 MCG tablet Take 500 mcg by mouth daily.    [provider]  vitamin C (ASCORBIC ACID) 500 MG tablet Take 500 mg by mouth daily.    [provider]  zonisamide  (ZONEGRAN ) 100 MG capsule Take 300 mg by mouth at bedtime. Take with 50 mg for a total of 350 mg    [provider]  zonisamide  (ZONEGRAN ) 50 MG capsule Take 50 mg by mouth at bedtime. Take with 300 mg for a total of 350 mg    [provider]    Allergies: Oxycodone , Levofloxacin, and Sulfasalazine    Review of Systems  Neurological:  Positive for headaches.    Updated Vital Signs BP (!) 132/93   Pulse 95   Temp 98.1 F (36.7 C) (Oral)   Resp 16   Ht 5' 2 (1.575 m)   Wt (!) 151.5 kg   SpO2 100%   BMI 61.09 kg/m   Physical Exam Vitals and nursing note reviewed.  Constitutional:      General: She is not in acute distress.    Appearance: She is not toxic-appearing.  HENT:     Head: Normocephalic and atraumatic.     Comments: No tenderness to palpation over temporal artery bilaterally. Eyes:     General: No scleral icterus.    Conjunctiva/sclera: Conjunctivae normal.  Cardiovascular:     Rate and Rhythm: Normal rate and regular rhythm.     Pulses: Normal pulses.     Heart sounds: Normal heart sounds.  Pulmonary:     Effort: Pulmonary effort is normal. No respiratory distress.     Breath sounds: Normal breath sounds.  Abdominal:     General: Abdomen is flat. Bowel sounds are normal.     Palpations: Abdomen is soft.     Tenderness: There is no abdominal tenderness.  Skin:    General: Skin is warm and dry.     Findings: No lesion.  Neurological:     General: No focal deficit present.     Mental Status: She is alert and oriented to person, place, and time. Mental status is at baseline.     GCS: GCS eye subscore is 4. GCS verbal subscore is 5. GCS  motor subscore is 6.     Comments: Patient is alert oriented answering questions appropriately with no slurred speech.  No nystagmus.  Pupils equal and reactive.  No visual field deficits.  No facial droop.  Upper and lower extremity sensation and strength are intact.  No ataxia with finger-to-nose or with heel-to-shin.     (all labs ordered are listed, but only abnormal results are displayed) Labs Reviewed  CBC  PREGNANCY, URINE  BASIC METABOLIC PANEL WITH GFR  HCG, SERUM, QUALITATIVE  EKG: None  Radiology: CT HEAD WO CONTRAST Result Date: 04/25/2024 CLINICAL DATA:  Headache, increasing frequency or severity EXAM: CT HEAD WITHOUT CONTRAST TECHNIQUE: Contiguous axial images were obtained from the base of the skull through the vertex without intravenous contrast. RADIATION DOSE REDUCTION: This exam was performed according to the departmental dose-optimization program which includes automated exposure control, adjustment of the mA and/or kV according to patient size and/or use of iterative reconstruction technique. COMPARISON:  Brain MRI 04/02/2022 FINDINGS: Brain: No intracranial hemorrhage, mass effect, or midline shift. No hydrocephalus. The basilar cisterns are patent. No evidence of territorial infarct or acute ischemia. No extra-axial or intracranial fluid collection. Vascular: No hyperdense vessel or unexpected calcification. Skull: Normal. Negative for fracture or focal lesion. Sinuses/Orbits: Paranasal sinuses and mastoid air cells are clear. The visualized orbits are unremarkable. Other: None. IMPRESSION: Negative noncontrast head CT. Electronically Signed   By: Andrea Gasman M.D.   On: 04/25/2024 14:57     Procedures   Medications Ordered in the ED  sodium chloride  0.9 % bolus 1,000 mL (1,000 mLs Intravenous New Bag/Given 04/25/24 1450)  prochlorperazine  (COMPAZINE ) injection 10 mg (10 mg Intravenous Given 04/25/24 1449)  diphenhydrAMINE  (BENADRYL ) injection 12.5 mg (12.5 mg  Intravenous Given 04/25/24 1448)  acetaminophen  (TYLENOL ) tablet 1,000 mg (1,000 mg Oral Given 04/25/24 1447)                                    Medical Decision Making Amount and/or Complexity of Data Reviewed Labs: ordered. Radiology: ordered.  Risk OTC drugs. Prescription drug management.   Janet Blanchard Clause 48 y.o. presented today for HA. Working Ddx: tension headache, migraine, intracranial mass, intracranial hemorrhage, intracranial infection including meningitis vs encephalitis, trigeminal neuralgia, AVM, sinusitis, cerebral aneurysm, muscular headache, cavernous sinus thrombosis, carotid artery dissection.  R/o DDx: intracranial mass, hemorrhage,or infection including meningitis vs encephalitis, trigeminal neuralgia, AVM, cerebral aneurysm, muscular headache, cavernous sinus thrombosis, carotid artery dissection are less likely due to history of present illness, physical exam, labs/imaging findings  PMHX: Migraines  Unique Tests and My Interpretation:  CBC: No leukocytosis, no anemia Bmp: No significant electrolyte abnormality.  Normal kidney function Hcg: Negative  CT Head w/o Contrast: no acute findings.   Problem List / ED Course / Critical interventions / Medication management  Patient presents emergency room with headache that is right-sided associated with nausea.  This started last night.  Patient reports that her presentation does seem a little bit atypical from normal headaches thus obtaining head CT.  I have very low suspicion for meningitis as no meningeal signs on exam, no fever and no identified high risk factors.  She has no tenderness over temporal artery to suggest this is related to temporal arteritis.  Given duration of symptoms and negative head CT feels symptoms are most likely atypical presentation of patient's chronic migraines.  He has no focal neurological deficits.  She is well-appearing and has normal vital signs. I ordered medication including Tylenol ,  Compazine , Benadryl .  1 L normal saline. Reevaluation of the patient after these medicines showed that the patient improved Patients vitals assessed. Upon arrival patient is hemodynamically stable.  I have reviewed the patients home medicines and have made adjustments as needed Patient is established with neurology and has appointment next week.  She will follow-up with neurology for this.  Given return precautions.        Final diagnoses:  Migraine syndrome  ED Discharge Orders     None          Janet Blanchard 04/25/24 1618    Dreama Longs, MD 04/26/24 [phone]

## 2024-04-25 NOTE — ED Notes (Signed)
 Pt declined using bathroom for urine sample. Call bell in reach when ready.

## 2024-04-25 NOTE — ED Triage Notes (Signed)
 Pt caox4, ambulatory NAD c/o headache that started last night while laying in bed at approx 2100 last night. Pain started around R eye, radiates down back of neck. Pt states she has hx migraines and that this feels different. 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

## 2024-05-04 ENCOUNTER — Ambulatory Visit
Admission: RE | Admit: 2024-05-04 | Discharge: 2024-05-04 | Disposition: A | Discharge status: Home / Self Care | Source: Ambulatory Visit | Attending: Physician Assistant | Admitting: Physician Assistant

## 2024-05-04 DIAGNOSIS — M5416 Radiculopathy, lumbar region: Secondary | ICD-10-CM

## 2024-08-01 ENCOUNTER — Encounter (HOSPITAL_BASED_OUTPATIENT_CLINIC_OR_DEPARTMENT_OTHER): Payer: Self-pay | Admitting: *Deleted

## 2024-08-01 ENCOUNTER — Emergency Department (HOSPITAL_BASED_OUTPATIENT_CLINIC_OR_DEPARTMENT_OTHER)
Admission: EM | Admit: 2024-08-01 | Discharge: 2024-08-01 | Disposition: A | Discharge status: Home / Self Care | Attending: Emergency Medicine | Admitting: Emergency Medicine

## 2024-08-01 ENCOUNTER — Other Ambulatory Visit: Payer: Self-pay

## 2024-08-01 ENCOUNTER — Emergency Department (HOSPITAL_BASED_OUTPATIENT_CLINIC_OR_DEPARTMENT_OTHER)

## 2024-08-01 DIAGNOSIS — L02416 Cutaneous abscess of left lower limb: Secondary | ICD-10-CM | POA: Diagnosis present

## 2024-08-01 DIAGNOSIS — L0291 Cutaneous abscess, unspecified: Secondary | ICD-10-CM

## 2024-08-01 LAB — CBC WITH DIFFERENTIAL/PLATELET
Abs Immature Granulocytes: 0.03 K/uL (ref 0.00–0.07)
Basophils Absolute: 0 K/uL (ref 0.0–0.1)
Basophils Relative: 0 %
Eosinophils Absolute: 0.2 K/uL (ref 0.0–0.5)
Eosinophils Relative: 1 %
HCT: 41.6 % (ref 36.0–46.0)
Hemoglobin: 13.2 g/dL (ref 12.0–15.0)
Immature Granulocytes: 0 %
Lymphocytes Relative: 23 %
Lymphs Abs: 2.8 K/uL (ref 0.7–4.0)
MCH: 27.8 pg (ref 26.0–34.0)
MCHC: 31.7 g/dL (ref 30.0–36.0)
MCV: 87.6 fL (ref 80.0–100.0)
Monocytes Absolute: 0.8 K/uL (ref 0.1–1.0)
Monocytes Relative: 6 %
Neutro Abs: 8.2 K/uL — ABNORMAL HIGH (ref 1.7–7.7)
Neutrophils Relative %: 70 %
Platelets: 409 K/uL — ABNORMAL HIGH (ref 150–400)
RBC: 4.75 MIL/uL (ref 3.87–5.11)
RDW: 14.1 % (ref 11.5–15.5)
WBC: 12 K/uL — ABNORMAL HIGH (ref 4.0–10.5)
nRBC: 0 % (ref 0.0–0.2)

## 2024-08-01 LAB — COMPREHENSIVE METABOLIC PANEL WITH GFR
ALT: <5 U/L (ref 0–44)
AST: 11 U/L — ABNORMAL LOW (ref 15–41)
Albumin: 4.2 g/dL (ref 3.5–5.0)
Alkaline Phosphatase: 108 U/L (ref 38–126)
Anion gap: 12 (ref 5–15)
BUN: 15 mg/dL (ref 6–20)
CO2: 23 mmol/L (ref 22–32)
Calcium: 9.6 mg/dL (ref 8.9–10.3)
Chloride: 105 mmol/L (ref 98–111)
Creatinine, Ser: 0.96 mg/dL (ref 0.44–1.00)
GFR, Estimated: >60 mL/min (ref >60)
Glucose, Bld: 85 mg/dL (ref 70–99)
Potassium: 3.6 mmol/L (ref 3.5–5.1)
Sodium: 140 mmol/L (ref 135–145)
Total Bilirubin: 0.2 mg/dL (ref 0.0–1.2)
Total Protein: 7.8 g/dL (ref 6.5–8.1)

## 2024-08-01 LAB — LACTIC ACID, PLASMA
Lactic Acid, Venous: 2.1 mmol/L (ref 0.5–1.9)
Lactic Acid, Venous: 3.1 mmol/L (ref 0.5–1.9)

## 2024-08-01 LAB — PREGNANCY, URINE: Preg Test, Ur: NEGATIVE

## 2024-08-01 MED ORDER — DOXYCYCLINE HYCLATE 100 MG PO TABS
100.0000 mg | ORAL_TABLET | Freq: Once | ORAL | Status: AC
  Administered 2024-08-01: 100 mg via ORAL
  Filled 2024-08-01: qty 1

## 2024-08-01 MED ORDER — ONDANSETRON HCL 4 MG/2ML IJ SOLN
4.0000 mg | Freq: Once | INTRAMUSCULAR | Status: AC
  Administered 2024-08-01: 4 mg via INTRAVENOUS
  Filled 2024-08-01: qty 2

## 2024-08-01 MED ORDER — VANCOMYCIN HCL IN DEXTROSE 1-5 GM/200ML-% IV SOLN
1000.0000 mg | Freq: Once | INTRAVENOUS | Status: AC
  Administered 2024-08-01: 1000 mg via INTRAVENOUS
  Filled 2024-08-01: qty 200

## 2024-08-01 MED ORDER — LACTATED RINGERS IV BOLUS
1000.0000 mL | Freq: Once | INTRAVENOUS | Status: AC
Start: 2024-08-01 — End: 2024-08-01
  Administered 2024-08-01: 1000 mL via INTRAVENOUS

## 2024-08-01 MED ORDER — KETOROLAC TROMETHAMINE 30 MG/ML IJ SOLN
30.0000 mg | Freq: Once | INTRAMUSCULAR | Status: AC
  Administered 2024-08-01: 30 mg via INTRAVENOUS
  Filled 2024-08-01: qty 1

## 2024-08-01 MED ORDER — CEPHALEXIN 250 MG PO CAPS: 500.0000 mg | ORAL_CAPSULE | Freq: Once | ORAL | Status: DC

## 2024-08-01 MED ORDER — IOHEXOL 300 MG/ML  SOLN
100.0000 mL | Freq: Once | INTRAMUSCULAR | Status: AC | PRN
  Administered 2024-08-01: 100 mL via INTRAVENOUS

## 2024-08-01 MED ORDER — DOXYCYCLINE HYCLATE 100 MG PO CAPS: 100.0000 mg | ORAL_CAPSULE | Freq: Two times a day (BID) | ORAL | 0 refills | Status: AC

## 2024-08-01 MED ORDER — MORPHINE SULFATE (PF) 4 MG/ML IV SOLN
4.0000 mg | Freq: Once | INTRAVENOUS | Status: AC
  Administered 2024-08-01: 4 mg via INTRAVENOUS
  Filled 2024-08-01: qty 1

## 2024-08-01 NOTE — Discharge Instructions (Addendum)
 Evaluation today revealed that you likely did have an abscess in your left lower quadrant of your abdomen but now it is effectively open and oozing.  Please allow it to continue to ooze as this will help relieve the pustulant fluid remaining.  I am starting you on doxycycline to treat for associated infection.  As we discussed if you have worsening abdominal pain, swelling or worsening redness around the wound, develop a fever or any other concerning symptom please return to the ED for further evaluation.  Otherwise recommend that you schedule appoint with general surgery and/or follow-up with your PCP.

## 2024-08-01 NOTE — ED Notes (Signed)
 PT to CT via wheelchair

## 2024-08-01 NOTE — ED Provider Notes (Signed)
 [place] EMERGENCY DEPARTMENT AT [facility] Provider Note   CSN: [id number] Arrival date & time: 08/01/24  1831     Patient presents with: Abscess and Cellulitis  HPI Janet Blanchard is a 48 y.o. female presenting for abscess.  She noticed it this past Monday.  Located in the left lower quadrant of her abdomen.  She states its already oozing pustulant discharge.  She is concerned because the area of swelling around it seems to be increasing.  She denies nausea vomiting and diarrhea.  Denies vaginal or urinary symptoms.  Denies fever.  Past Medical History:  Diagnosis Date   Allergic rhinitis    Anxiety    Anxiety and depression    Back pain    Cervical dysplasia    DDD (degenerative disc disease)    L3-S1 with chronic back pain   Depression    Dyspnea    with exertion due to weight   Frequency    GERD (gastroesophageal reflux disease)    Herniated disc    L 3-4, L 4-5, S1   Hiatal hernia    repaired when lap band done   HNP (herniated nucleus pulposus), lumbar    L3- S1   IBS (irritable bowel syndrome)    Migraine headache    more than 3 a month   Nocturia    Obesity    BMI >54 ,s/p lap band 2013   Osteopenia    Osteopenia    PTSD (post-traumatic stress disorder)    Sinusitis    sinus infection 4'17- no problems now-tx. antibiotic   Vitamin D  deficiency    improved       Abscess      Prior to Admission medications   Medication Sig Start Date End Date Taking? Authorizing Provider  doxycycline (VIBRAMYCIN) 100 MG capsule Take 1 capsule (100 mg total) by mouth 2 (two) times daily. 08/01/24  Yes Lang Norleen POUR, PA-C  ALPRAZolam  (XANAX ) 1 MG tablet Take 0.5-1 mg by mouth at bedtime.    [provider]  amphetamine-dextroamphetamine (ADDERALL) 20 MG tablet Take 20 mg by mouth See admin instructions. Take Monday - Friday    [provider]  aspirin-acetaminophen -caffeine (EXCEDRIN MIGRAINE) 250-250-65 MG tablet Take 1-2 tablets by  mouth daily as needed for headache.     [provider]  baclofen  (LIORESAL ) 10 MG tablet Take 10 mg by mouth 2 (two) times daily as needed (migraines).  01/07/13   [provider]  BIOTIN PO Take 1 tablet by mouth daily.     [provider]  bismuth subsalicylate (PEPTO BISMOL) 262 MG chewable tablet Chew 524 mg by mouth as needed for indigestion.     [provider]  BLACK COHOSH EXTRACT PO Take 540 mg by mouth daily.    [provider]  busPIRone  (BUSPAR ) 15 MG tablet Take 15 mg by mouth 2 (two) times daily. 03/07/22   [provider]  cetirizine (ZYRTEC) 10 MG tablet Take 10 mg by mouth daily.     [provider]  Cholecalciferol (VITAMIN D3 SUPER STRENGTH) 50 MCG (2000 UT) TABS Take 2,000 Units by mouth daily.    [provider]  diclofenac (VOLTAREN) 50 MG EC tablet Take 50 mg by mouth every morning. 10/05/18   [provider]  dicyclomine  (BENTYL ) 20 MG tablet TAKE 1 TABLET BY MOUTH 2 TIMES DAILY AS NEEDED. 03/04/21   Kerman Vina HERO, NP  Diphenhydramine -Pseudoephed (BENADRYL  ALLERGY/SINUS PO) Take 1-2 tablets by mouth 3 (three)  times daily as needed (sinus headches.).    [provider]  estradiol (ESTRACE) 1 MG tablet Take 1 mg by mouth daily. 01/22/22   [provider]  famotidine  (PEPCID ) 20 MG tablet Take 1 tablet (20 mg total) by mouth at bedtime. 07/23/18   Avram Lupita BRAVO, MD  FLUoxetine  (PROZAC ) 20 MG capsule Take 60 mg by mouth at bedtime.    [provider]  fluticasone (FLONASE) 50 MCG/ACT nasal spray Place 2 sprays into both nostrils daily.     [provider]  Galcanezumab-gnlm [facility]) Inject into the skin every 30 (thirty) days.    [provider]  Loperamide HCl (IMODIUM A-D PO) Take 1-4 tablets by mouth 2 (two) times daily as needed (diarrhea/loose stools).     [provider]  meclizine  (ANTIVERT ) 25 MG tablet Take 25 mg by mouth 3 (three)  times daily as needed for dizziness.     [provider]  montelukast  (SINGULAIR ) 10 MG tablet Take 10 mg by mouth daily.     [provider]  omeprazole  (PRILOSEC) 40 MG capsule TAKE 1 CAPSULE BY MOUTH EVERY DAY 01/11/22   Avram Lupita BRAVO, MD  ondansetron  (ZOFRAN -ODT) 8 MG disintegrating tablet Take 8 mg by mouth daily as needed for nausea or refractory nausea / vomiting. 07/04/20   [provider]  progesterone (PROMETRIUM) 200 MG capsule Take 200 mg by mouth at bedtime.  11/28/18   [provider]  simethicone (MYLICON) 125 MG chewable tablet Chew 125 mg by mouth every 6 (six) hours as needed for flatulence.    [provider]  sucralfate  (CARAFATE ) 1 g tablet TAKE 1 TABLET (1 G TOTAL) BY MOUTH AS NEEDED. 01/27/21   Kerman Vina HERO, NP  vitamin B-12 (CYANOCOBALAMIN) 500 MCG tablet Take 500 mcg by mouth daily.    [provider]  vitamin C (ASCORBIC ACID) 500 MG tablet Take 500 mg by mouth daily.    [provider]  zonisamide  (ZONEGRAN ) 100 MG capsule Take 300 mg by mouth at bedtime. Take with 50 mg for a total of 350 mg    [provider]  zonisamide  (ZONEGRAN ) 50 MG capsule Take 50 mg by mouth at bedtime. Take with 300 mg for a total of 350 mg    [provider]    Allergies: Oxycodone , Levofloxacin, and Sulfasalazine    Review of Systems See HPI  Updated Vital Signs BP 118/89   Pulse 98   Temp 97.6 F (36.4 C)   Resp 16   SpO2 98%   Physical Exam Vitals and nursing note reviewed.  HENT:     Head: Normocephalic and atraumatic.     Mouth/Throat:     Mouth: Mucous membranes are moist.  Eyes:     General:        Right eye: No discharge.        Left eye: No discharge.     Conjunctiva/sclera: Conjunctivae normal.  Cardiovascular:     Rate and Rhythm: Normal rate and regular rhythm.     Pulses: Normal pulses.     Heart sounds: Normal heart sounds.  Pulmonary:     Effort: Pulmonary effort is normal.      Breath sounds: Normal breath sounds.  Abdominal:     General: Abdomen is flat.     Palpations: Abdomen is soft.   Skin:    General: Skin is warm and dry.  Neurological:     General: No focal deficit present.  Psychiatric:        Mood and Affect: Mood normal.     (all labs ordered are listed, but only abnormal results are displayed) Labs Reviewed  LACTIC ACID, PLASMA - Abnormal; Notable for the following components:      Result Value   Lactic Acid, Venous 2.1 (*)    All other components within normal limits  COMPREHENSIVE METABOLIC PANEL WITH GFR - Abnormal; Notable for the following components:   AST 11 (*)    All other components within normal limits  CBC WITH DIFFERENTIAL/PLATELET - Abnormal; Notable for the following components:   WBC 12.0 (*)    Platelets 409 (*)    Neutro Abs 8.2 (*)    All other components within normal limits  CULTURE, BLOOD (ROUTINE X 2)  CULTURE, BLOOD (ROUTINE X 2)  PREGNANCY, URINE  LACTIC ACID, PLASMA    EKG: None  Radiology: CT ABDOMEN PELVIS W CONTRAST Result Date: 08/01/2024 EXAM: CT ABDOMEN AND PELVIS WITH CONTRAST 08/01/2024 09:40:00 PM TECHNIQUE: CT of the abdomen and pelvis was performed with the administration of 100 mL of iohexol  (OMNIPAQUE ) 300 MG/ML solution. Multiplanar reformatted images are provided for review. Automated exposure control, iterative reconstruction, and/or weight-based adjustment of the mA/kV was utilized to reduce the radiation dose to as low as reasonably achievable. COMPARISON: 03/30/2016. CLINICAL HISTORY: Abdominal pain, acute, nonlocalized. Ptto ED reporting abscess on her groin since Tuesday. Patient reports it has started draining with a foul odor and pain today. FINDINGS: LOWER CHEST: No acute abnormality. LIVER: The liver is unremarkable. GALLBLADDER AND BILE DUCTS: Status post cholecystectomy. No biliary ductal dilatation. SPLEEN: No acute abnormality. PANCREAS: No acute abnormality. ADRENAL GLANDS: No  acute abnormality. KIDNEYS, URETERS AND BLADDER: No stones in the kidneys or ureters. No hydronephrosis. No perinephric or periureteral stranding. Urinary bladder is unremarkable. GI AND BOWEL: Stomach demonstrates no acute abnormality. There is no bowel obstruction. Normal appendix (image 55). PERITONEUM AND RETROPERITONEUM: No ascites. No free air. VASCULATURE: Aorta is normal in caliber. LYMPH NODES: No lymphadenopathy. REPRODUCTIVE ORGANS: Uterus is within normal limits. BONES AND SOFT TISSUES: Mild degenerative changes of the visualized thoracolumbar spine. Soft tissue edema and inflammatory stranding within subcutaneous fat of lower left anterior abdominal wall (image 85), incompletely visualized with mild phlegmonous change, but without drainable fluid collection/abscess. IMPRESSION: 1. Suspected cellulitis involving the left lower abdominal wall, without drainable fluid collection or abscess, although incompletely visualized. 2. Specifically, the perineal region/groin was not imaged. Electronically signed by: Pinkie Pebbles MD 08/01/2024 09:55 PM EDT RP Workstation: [id number]     Procedures   Medications Ordered in the ED  doxycycline (VIBRA-TABS) tablet 100 mg (has no administration in time range)  vancomycin (VANCOCIN) IVPB 1000 mg/200 mL premix (1,000 mg Intravenous New Bag/Given 08/01/24 2121)  lactated ringers  bolus 1,000 mL (1,000 mLs Intravenous New Bag/Given 08/01/24 2120)  iohexol  (OMNIPAQUE ) 300 MG/ML solution 100 mL (100 mLs Intravenous Contrast Given 08/01/24 2135)  morphine  (PF) 4 MG/ML injection 4 mg (4 mg Intravenous Given 08/01/24 2212)  ondansetron  (ZOFRAN ) injection 4 mg (4 mg Intravenous Given 08/01/24 2212)                                    Medical Decision Making Amount and/or Complexity of Data Reviewed Labs: ordered. Radiology: ordered.  Risk Prescription drug management.   Initial Impression and Ddx 48 year old well-appearing female presenting for  abscess.  Exam findings notable for what  appears to be an abscess with surrounding cellulitis below the pannus in the left lower quadrant of the abdomen, also tachycardic.  DDx includes cellulitis, abscess with or without tracking into the intra-abdominal space, sepsis, Fournier's, other. Patient PMH that increases complexity of ED encounter:  none  Interpretation of Diagnostics - I independent reviewed and interpreted the labs as followed: Leukocytosis 12.0, lactic acidosis 2.1  - I independently visualized the following imaging with scope of interpretation limited to determining acute life threatening conditions related to emergency care: CT, which revealed cellulitis in the left lower quadrant of the abdomen but no obvious associated collection of fluid.  Shared findings with patient.  Patient Reassessment and Ultimate Disposition/Management On reassessment she remained well-appearing, heart rate did improve with IV fluid hydration.  Feel that now that the wound is open and oozing, source control has been effectively achieved.  She did receive 1 dose of vancomycin here.  Also gave her doxycycline.  Will send her home on doxycycline and advised her to follow-up with general surgery.  We did discuss strict return precautions.  Discharged in good condition.  Advised Tylenol  and ibuprofen  at home for pain.  Patient management required discussion with the following services or consulting groups:  None  Complexity of Problems Addressed Acute complicated illness or Injury  Additional Data Reviewed and Analyzed Further history obtained from: Past medical history and medications listed in the EMR and Prior ED visit notes  Patient Encounter Risk Assessment Consideration of hospitalization      Final diagnoses:  Abscess    ED Discharge Orders          Ordered    doxycycline (VIBRAMYCIN) 100 MG capsule  2 times daily        08/01/24 2240               Graceson Nichelson K,  PA-C 08/01/24 2242    Ruthe Cornet, DO 08/01/24 2318

## 2024-08-01 NOTE — ED Triage Notes (Signed)
 Ptto ED reporting abscess on her groin since Tuesday. Patient reports it has started draining with a foul odor and pain today.

## 2024-08-07 LAB — CULTURE, BLOOD (ROUTINE X 2)
Culture: NO GROWTH
Culture: NO GROWTH
Special Requests: ADEQUATE
Special Requests: ADEQUATE
